# Patient Record
Sex: Female | Born: 1970 | Race: Black or African American | Hispanic: No | Smoking: Never smoker
Health system: Southern US, Community
[De-identification: ages and names within clinical notes are randomized; demographics above are authoritative.]

## PROBLEM LIST (undated history)

## (undated) DIAGNOSIS — J45909 Unspecified asthma, uncomplicated: Secondary | ICD-10-CM

## (undated) DIAGNOSIS — I252 Old myocardial infarction: Secondary | ICD-10-CM

## (undated) DIAGNOSIS — I10 Essential (primary) hypertension: Secondary | ICD-10-CM

## (undated) DIAGNOSIS — I509 Heart failure, unspecified: Secondary | ICD-10-CM

## (undated) DIAGNOSIS — F32A Depression, unspecified: Secondary | ICD-10-CM

## (undated) DIAGNOSIS — E119 Type 2 diabetes mellitus without complications: Secondary | ICD-10-CM

## (undated) DIAGNOSIS — F329 Major depressive disorder, single episode, unspecified: Secondary | ICD-10-CM

## (undated) HISTORY — PX: ABDOMINAL HYSTERECTOMY: SHX81

## (undated) HISTORY — PX: TONSILLECTOMY: SUR1361

## (undated) HISTORY — PX: BACK SURGERY: SHX140

## (undated) HISTORY — PX: GANGLION CYST EXCISION: SHX1691

## (undated) HISTORY — PX: HERNIA REPAIR: SHX51

---

## 2018-04-16 ENCOUNTER — Emergency Department (HOSPITAL_COMMUNITY)
Admission: EM | Admit: 2018-04-16 | Discharge: 2018-04-16 | Disposition: A | Discharge status: Home / Self Care | Payer: Self-pay | Attending: Emergency Medicine | Admitting: Emergency Medicine

## 2018-04-16 ENCOUNTER — Emergency Department (HOSPITAL_COMMUNITY): Payer: Self-pay

## 2018-04-16 ENCOUNTER — Encounter (HOSPITAL_COMMUNITY): Payer: Self-pay | Admitting: Emergency Medicine

## 2018-04-16 DIAGNOSIS — J45909 Unspecified asthma, uncomplicated: Secondary | ICD-10-CM | POA: Insufficient documentation

## 2018-04-16 DIAGNOSIS — I10 Essential (primary) hypertension: Secondary | ICD-10-CM | POA: Insufficient documentation

## 2018-04-16 DIAGNOSIS — I509 Heart failure, unspecified: Secondary | ICD-10-CM | POA: Insufficient documentation

## 2018-04-16 DIAGNOSIS — M25562 Pain in left knee: Secondary | ICD-10-CM | POA: Insufficient documentation

## 2018-04-16 DIAGNOSIS — I110 Hypertensive heart disease with heart failure: Secondary | ICD-10-CM | POA: Insufficient documentation

## 2018-04-16 DIAGNOSIS — M25561 Pain in right knee: Secondary | ICD-10-CM | POA: Insufficient documentation

## 2018-04-16 HISTORY — DX: Depression, unspecified: F32.A

## 2018-04-16 HISTORY — DX: Unspecified asthma, uncomplicated: J45.909

## 2018-04-16 HISTORY — DX: Essential (primary) hypertension: I10

## 2018-04-16 HISTORY — DX: Major depressive disorder, single episode, unspecified: F32.9

## 2018-04-16 HISTORY — DX: Heart failure, unspecified: I50.9

## 2018-04-16 MED ORDER — HYDROCODONE-ACETAMINOPHEN 5-325 MG PO TABS: 1.0000 | ORAL_TABLET | ORAL | 0 refills | Status: DC | PRN

## 2018-04-16 MED ORDER — CEPHALEXIN 500 MG PO CAPS: 500.0000 mg | ORAL_CAPSULE | Freq: Once | ORAL | Status: DC

## 2018-04-16 MED ORDER — SULFAMETHOXAZOLE-TRIMETHOPRIM 800-160 MG PO TABS: 1.0000 | ORAL_TABLET | Freq: Once | ORAL | Status: DC

## 2018-04-16 MED ORDER — HYDROCODONE-ACETAMINOPHEN 5-325 MG PO TABS
2.0000 | ORAL_TABLET | Freq: Once | ORAL | Status: AC
  Administered 2018-04-16: 2 via ORAL
  Filled 2018-04-16: qty 2

## 2018-04-16 NOTE — Discharge Instructions (Signed)
It was my pleasure taking care of you today!   Please see the information and instructions below regarding your visit.  Your diagnoses today include:  1. Acute pain of both knees     Tests performed today include: See side panel of your discharge paperwork for testing performed today. Vital signs are listed at the bottom of these instructions.   Your xrays show extensive arthritis.   Medications prescribed:    Take any prescribed medications only as prescribed, and any over the counter medications only as directed on the packaging.  Norco as needed for pain. Use crutches as needed for comfort. Ice and elevate knee throughout the day.  You have been prescribed Norco for pain. This is an opioid pain medication. You may take this medication every 4-6 hours as needed for pain. Only take this medication if you need it for breakthrough pain. You may combine this medicine with your Meloxicam, a non-steroidal anti-inflammatory drug (NSAID).  Do not combine this medication with Tylenol, as it may increase the risk of liver problems.  Do not combine this medication with alcohol.  Please be advised to avoid driving or operating heavy machinery while taking this medication, as it may make you drowsy or impair judgment.    Home care instructions:  Please follow any educational materials contained in this packet.   Follow-up instructions: Please follow-up with your primary care provider as soon as possible for further evaluation of your symptoms if they are not completely improved.   Call the orthopedist listed today or tomorrow to schedule follow up appointment for recheck of ongoing knee pain in one to two weeks. That appointment can be canceled with a 24-48 hour notice if complete resolution of pain.   Follow up with the orthopedist listed if symptoms do not improve in one week.   Return instructions:  Please return to the Emergency Department if you experience worsening symptoms.    Please return for any increasing swelling, increasing pain, loss of color to the lower leg, or increasing redness. Please return if you have any other emergent concerns.  Additional Information:   Your vital signs today were: BP (!) 176/102 (BP Location: Left Arm)    Pulse 85    Temp 99.3 F (37.4 C) (Oral)    Resp 19    SpO2 99%  If your blood pressure (BP) was elevated on multiple readings during this visit above 130 for the top number or above 80 for the bottom number, please have this repeated by your primary care provider within one month. --------------

## 2018-04-16 NOTE — ED Triage Notes (Signed)
Blat knee pain for weeks. Denies any falls or injuries to cause pain.

## 2018-04-16 NOTE — Progress Notes (Signed)
Orthopedic Tech Progress Note Patient Details:  Courtney Clayton 30-Jul-1970 [id number] Pt is not going to be able to use crutches. Pt was told to get a walker if she can in order to take some weight off her knee. Patient ID: Courtney Clayton, female   DOB: 30-Jul-1970, 47 y.o.   MRN: [id number]   Tawni CarnesHarrison, Laloni Rowton [facility] Hills[facility] 04/16/2018, 5:28 PM

## 2018-04-16 NOTE — ED Provider Notes (Signed)
Medical screening examination/treatment/procedure(s) were conducted as a shared visit with non-physician practitioner(s) and myself.  I personally evaluated the patient during the encounter.  None Patient has had longstanding chronic knee pain.  She has had pain for years since falling on her knees.  She says several years ago she was seeing orthopedics and she was told that she had degenerative changes and problems with the cartilages in her knees.  The severity of pain waxes and wanes.  Patient does not have any erythema.  She has general tenderness to palpation of the anterior knee the right.  There is no erythema or warmth.  Lower leg is pliable.  Left knee visible swelling or erythema.  Slightly tender to palpation but not significantly so.  This time, appears to be acute on chronic knee pain.  Currently not suggestive of infectious etiology.  Agree with treatment and plan.   Arby BarrettePfeiffer, Treasure Ochs, MD 04/16/18 [phone]

## 2018-04-18 NOTE — ED Provider Notes (Signed)
[facility]-EMERGENCY DEPT Provider Note   CSN: [id number] Arrival date & time: 04/16/18  1327     History   Chief Complaint Chief Complaint  Patient presents with  . Knee Pain    HPI Courtney Clayton is a 47 y.o. female.  HPI  Patient is a 47 year old female with a history of hypertension, asthma presenting for bilateral knee pain.  Patient reports that she has had progressively worsening bilateral knee pain for several months, have for the past 3 days, the pain in her right knee has become so unbearable she is barely been able to walk on it.  Patient denies any erythema or swelling bilateral knees.  Denies loss of sensation or weakness distally.  Patient reports she has a prior history of knee trauma.  She is never been evaluated by an orthopedist.  Patient has any fever or chills.  No history of immunocompromise status, IVDU, or autoimmune disease.  Patient reports she has been taking home meloxicam which is a home baseline medication for without relief.  Past Medical History:  Diagnosis Date  . Asthma   . CHF (congestive heart failure) (HCC)   . Depression   . Hypertension     There are no active problems to display for this patient.   Past Surgical History:  Procedure Laterality Date  . ABDOMINAL HYSTERECTOMY    . BACK SURGERY    . GANGLION CYST EXCISION    . HERNIA REPAIR    . TONSILLECTOMY       OB History   None      Home Medications    Prior to Admission medications   Medication Sig Start Date End Date Taking? Authorizing Provider  HYDROcodone-acetaminophen (NORCO/VICODIN) 5-325 MG tablet Take 1-2 tablets by mouth every 4 (four) hours as needed. 04/16/18   Elisha Ponder, PA-C    Family History No family history on file.  Social History Social History   Tobacco Use  . Smoking status: Never Smoker  . Smokeless tobacco: Never Used  Substance Use Topics  . Alcohol use: Not on file  . Drug use: Not on file     Allergies    Patient has no known allergies.   Review of Systems Review of Systems  Constitutional: Negative for chills and fever.  Musculoskeletal: Positive for arthralgias and myalgias.  Skin: Negative for color change and wound.  Allergic/Immunologic: Negative for immunocompromised state.  Neurological: Negative for weakness and numbness.     Physical Exam Updated Vital Signs BP (!) 176/102 (BP Location: Left Arm)   Pulse 83   Temp 99.3 F (37.4 C) (Oral)   Resp 19   SpO2 98%   Physical Exam  Constitutional: She appears well-developed and well-nourished. No distress.  Sitting comfortably in bed.  HENT:  Head: Normocephalic and atraumatic.  Eyes: Conjunctivae are normal. Right eye exhibits no discharge. Left eye exhibits no discharge.  EOMs normal to gross examination.  Neck: Normal range of motion.  Cardiovascular: Normal rate and regular rhythm.  Intact, 2+ DP pulse bilaterally.  Pulmonary/Chest:  Normal respiratory effort. Patient converses comfortably. No audible wheeze or stridor.  Abdominal: She exhibits no distension.  Musculoskeletal:  Right knee with tenderness to palpation of anterior knee. Decreased ROM 2/2 pain. No joint line tenderness. No joint effusion or swelling appreciated. No abnormal alignment or patellar mobility. No bruising, erythema or warmth overlaying the joint. No varus/valgus laxity. Negative drawer's, Lachman's and McMurray's.  No crepitus.  2+ DP pulses bilaterally. All  compartments are soft. Sensation intact distal to injury.  Left knee with no tenderness to palpation of the joint. Full ROM. No joint line tenderness. No joint effusion or swelling appreciated. No abnormal alignment or patellar mobility. No bruising, erythema or warmth overlaying the joint. No varus/valgus laxity. Negative drawer's, Lachman's and McMurray's.  No crepitus.  2+ DP pulses bilaterally. All compartments are soft. Sensation intact distal to injury.   Neurological: She is alert.   Cranial nerves intact to gross observation. Patient moves extremities without difficulty.  Skin: Skin is warm and dry. She is not diaphoretic.  Psychiatric: She has a normal mood and affect. Her behavior is normal. Judgment and thought content normal.  Nursing note and vitals reviewed.    ED Treatments / Results  Labs (all labs ordered are listed, but only abnormal results are displayed) Labs Reviewed - No data to display  EKG None  Radiology Dg Knee Complete 4 Views Left  Result Date: 04/16/2018 CLINICAL DATA:  Bilateral knee pain for weeks.  No trauma. EXAM: LEFT KNEE - COMPLETE 4+ VIEW COMPARISON:  None. FINDINGS: No acute fracture or dislocation. Significantly age advanced, mild to moderate medial and patellofemoral compartment joint space narrowing with osteophyte formation. Enthesophyte at the quadriceps insertion and patellar tendon origin. No joint effusion. 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  No acute findings. Electronically Signed   By: Jeronimo Greaves M.D.   On: 04/16/2018 15:23   Dg Knee Complete 4 Views Right  Result Date: 04/16/2018 CLINICAL DATA:  Knee pain for weeks without recent trauma. EXAM: RIGHT KNEE - COMPLETE 4+ VIEW COMPARISON:  None. FINDINGS: Mild but age advanced medial and patellofemoral compartment joint space narrowing with osteophyte formation. Enthesophyte at the quadriceps insertion. No acute fracture or dislocation. No joint effusion. IMPRESSION: Degenerative change, without acute osseous finding. Electronically Signed   By: Jeronimo Greaves M.D.   On: 04/16/2018 15:27    Procedures Procedures (including critical care time)  Medications Ordered in ED Medications  HYDROcodone-acetaminophen (NORCO/VICODIN) 5-325 MG per tablet 2 tablet (2 tablets Oral Given 04/16/18 1653)     Initial Impression / Assessment and Plan / ED Course  I have reviewed the triage vital signs and the nursing notes.  Pertinent labs & imaging results that were available  during my care of the patient were reviewed by me and considered in my medical decision making (see chart for details).     Patient is nontoxic-appearing, afebrile, and in no acute distress.  Patient with progressively worsening knee pain.  There is no effusion appreciated on exam nor x-ray of the right knee which is most symptomatic patient.  Do not suspect septic arthritis nor hemarthrosis.  Radiographs of bilateral knees demonstrating age advanced osteoarthritis.  Suspect that patient has worsening osteoarthritis versus degenerative meniscal tear.  Given knee sleeves, and crutches for supportive care with the right knee just to use as tolerated.  Instructed patient that ultimate management will be weightbearing as tolerated, orthopedics follow-up.  Patient had multiple elevated blood pressures emergency department.  Patient with a history of hypertension, and reports taking her antihypertensives today.  Patient prescribed short course of Norco to avoid NSAIDs at this time.  Patient started not to drive, drink alcohol, or operate machinery while taking this medication.   I have reviewed the patient's information in the [place] Controlled Substance Database for the past 12 months and found them to have no Rx overlapping.  Opiates were prescribed for an acute, painful condition. The patient was given information  on side effects and encouraged to use other, non-opiate pain medication primary, only using opiate medicine sparingly for severe pain.  This is a shared visit with Dr. Arby BarretteMarcy Pfeiffer. Patient was independently evaluated by this attending physician. Attending physician consulted in evaluation and discharge management.  Final Clinical Impressions(s) / ED Diagnoses   Final diagnoses:  Acute pain of both knees  Elevated blood pressure reading in office with diagnosis of hypertension    ED Discharge Orders         Ordered    HYDROcodone-acetaminophen (NORCO/VICODIN) 5-325 MG tablet   Every 4 hours PRN     04/16/18 1712           Elisha PonderMurray, Quante Pettry B, PA-C 04/18/18 1819    Arby BarrettePfeiffer, Marcy, MD 04/20/18 [phone]

## 2018-05-02 ENCOUNTER — Other Ambulatory Visit: Payer: Self-pay | Admitting: Orthopedic Surgery

## 2018-05-02 DIAGNOSIS — R52 Pain, unspecified: Secondary | ICD-10-CM

## 2018-05-02 DIAGNOSIS — R531 Weakness: Secondary | ICD-10-CM

## 2018-05-16 ENCOUNTER — Ambulatory Visit
Admission: RE | Admit: 2018-05-16 | Discharge: 2018-05-16 | Disposition: A | Discharge status: Home / Self Care | Payer: 59 | Source: Ambulatory Visit | Attending: Orthopedic Surgery | Admitting: Orthopedic Surgery

## 2018-05-16 DIAGNOSIS — R52 Pain, unspecified: Secondary | ICD-10-CM

## 2018-05-16 DIAGNOSIS — R531 Weakness: Secondary | ICD-10-CM

## 2019-03-27 ENCOUNTER — Other Ambulatory Visit: Payer: Self-pay

## 2019-03-27 DIAGNOSIS — Z20822 Contact with and (suspected) exposure to covid-19: Secondary | ICD-10-CM

## 2019-03-29 LAB — NOVEL CORONAVIRUS, NAA: SARS-CoV-2, NAA: NOT DETECTED

## 2019-04-01 ENCOUNTER — Other Ambulatory Visit: Payer: Self-pay

## 2019-04-01 ENCOUNTER — Emergency Department (HOSPITAL_COMMUNITY)
Admission: EM | Admit: 2019-04-01 | Discharge: 2019-04-01 | Disposition: A | Discharge status: Home / Self Care | Payer: 59 | Attending: Emergency Medicine | Admitting: Emergency Medicine

## 2019-04-01 ENCOUNTER — Encounter (HOSPITAL_COMMUNITY): Payer: Self-pay | Admitting: Emergency Medicine

## 2019-04-01 DIAGNOSIS — J45909 Unspecified asthma, uncomplicated: Secondary | ICD-10-CM | POA: Insufficient documentation

## 2019-04-01 DIAGNOSIS — T7840XA Allergy, unspecified, initial encounter: Secondary | ICD-10-CM

## 2019-04-01 DIAGNOSIS — I110 Hypertensive heart disease with heart failure: Secondary | ICD-10-CM | POA: Insufficient documentation

## 2019-04-01 DIAGNOSIS — E119 Type 2 diabetes mellitus without complications: Secondary | ICD-10-CM | POA: Diagnosis not present

## 2019-04-01 DIAGNOSIS — I509 Heart failure, unspecified: Secondary | ICD-10-CM | POA: Diagnosis not present

## 2019-04-01 DIAGNOSIS — L509 Urticaria, unspecified: Secondary | ICD-10-CM

## 2019-04-01 DIAGNOSIS — L500 Allergic urticaria: Secondary | ICD-10-CM | POA: Diagnosis not present

## 2019-04-01 HISTORY — DX: Type 2 diabetes mellitus without complications: E11.9

## 2019-04-01 MED ORDER — METHYLPREDNISOLONE SODIUM SUCC 125 MG IJ SOLR
125.0000 mg | Freq: Once | INTRAMUSCULAR | Status: AC
  Administered 2019-04-01: 02:00:00 125 mg via INTRAVENOUS
  Filled 2019-04-01: qty 2

## 2019-04-01 MED ORDER — FAMOTIDINE 20 MG PO TABS: 20.0000 mg | ORAL_TABLET | Freq: Two times a day (BID) | ORAL | 0 refills | Status: DC

## 2019-04-01 MED ORDER — FAMOTIDINE IN NACL 20-0.9 MG/50ML-% IV SOLN
20.0000 mg | Freq: Once | INTRAVENOUS | Status: AC
  Administered 2019-04-01: 20 mg via INTRAVENOUS
  Filled 2019-04-01: qty 50

## 2019-04-01 MED ORDER — DIPHENHYDRAMINE HCL 50 MG/ML IJ SOLN
25.0000 mg | Freq: Once | INTRAMUSCULAR | Status: AC
  Administered 2019-04-01: 25 mg via INTRAVENOUS
  Filled 2019-04-01: qty 1

## 2019-04-01 MED ORDER — PREDNISONE 20 MG PO TABS: 40.0000 mg | ORAL_TABLET | Freq: Every day | ORAL | 0 refills | Status: DC

## 2019-04-01 MED ORDER — CETIRIZINE HCL 10 MG PO TABS: 10.0000 mg | ORAL_TABLET | Freq: Two times a day (BID) | ORAL | 0 refills | Status: DC

## 2019-04-01 MED ORDER — HYDROCORTISONE 2.5 % EX LOTN: TOPICAL_LOTION | Freq: Two times a day (BID) | CUTANEOUS | 0 refills | Status: DC

## 2019-04-01 MED ORDER — HYDROCORTISONE 1 % EX LOTN
TOPICAL_LOTION | Freq: Three times a day (TID) | CUTANEOUS | Status: DC
  Administered 2019-04-01: 04:00:00 via TOPICAL
  Filled 2019-04-01: qty 118

## 2019-04-01 NOTE — Discharge Instructions (Signed)
1. Medications: Prednisone, Zyrtec, Pepcid, hydrocortisone lotion, usual home medications 2. Treatment: rest, drink plenty of fluids, take medications as prescribed 3. Follow Up: Please followup with your primary doctor in 3 days for discussion of your diagnoses and further evaluation after today's visit; please also follow-up with asthma and allergy center.; Return to the ER for difficulty breathing, return of allergic reaction or other concerning symptoms

## 2019-04-01 NOTE — ED Triage Notes (Signed)
Pt arriving with complaint of allergic reaction. Pt reports severe itching over the last 2 days. Pt reports using several different methods at home to soothe symptoms with no relief. Unknown source.

## 2019-04-01 NOTE — ED Provider Notes (Signed)
[facility] DEPT Provider Note   CSN: [id number] Arrival date & time: 04/01/19  0028     History   Chief Complaint Chief Complaint  Patient presents with  . Allergic Reaction    HPI Courtney Clayton is a 48 y.o. female with a hx of asthma, CHF, depression, diabetes, hypertension presents to the Emergency Department complaining of gradual, persistent, progressively worsening itchy rash onset approximately 2 days ago.  Patient reports similar to previous allergic reactions.  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  She denies any exposure to either of these.  Several days before her rash started she did begin taking a vitamin C and zinc supplements.  She has not stopped these yet.  She reports using chamomile lotion, oatmeal baths and Benadryl without relief of her itching.  Denies any additional exposures.  Denies known bug bites.  No one else in the home has a rash.  Patient denies difficulty breathing, wheezing, fever, chills, nausea, vomiting.  Patient has never had an anaphylactic reaction.       The history is provided by the patient and medical records. No language interpreter was used.    Past Medical History:  Diagnosis Date  . Asthma   . CHF (congestive heart failure) ([place])   . Depression   . Diabetes mellitus without complication ([place])   . Hypertension     There are no active problems to display for this patient.   Past Surgical History:  Procedure Laterality Date  . ABDOMINAL HYSTERECTOMY    . BACK SURGERY    . GANGLION CYST EXCISION    . HERNIA REPAIR    . TONSILLECTOMY       OB History   No obstetric history on file.      Home Medications    Prior to Admission medications   Medication Sig Start Date End Date Taking? Authorizing Provider  cetirizine (ZYRTEC ALLERGY) 10 MG tablet Take 1 tablet (10 mg total) by mouth 2 (two) times daily for 5 days. 04/01/19 04/06/19  Bengie Kaucher,  Jarrett Soho, PA-C  famotidine (PEPCID) 20 MG tablet Take 1 tablet (20 mg total) by mouth 2 (two) times daily. 04/01/19   Angelique Chevalier, Jarrett Soho, PA-C  HYDROcodone-acetaminophen (NORCO/VICODIN) 5-325 MG tablet Take 1-2 tablets by mouth every 4 (four) hours as needed. 04/16/18   Langston Masker B, PA-C  hydrocortisone 2.5 % lotion Apply topically 2 (two) times daily. 04/01/19   Nettye Flegal, Jarrett Soho, PA-C  predniSONE (DELTASONE) 20 MG tablet Take 2 tablets (40 mg total) by mouth daily. 04/01/19   Winn Muehl, Jarrett Soho, PA-C    Family History No family history on file.  Social History Social History   Tobacco Use  . Smoking status: Never Smoker  . Smokeless tobacco: Never Used  Substance Use Topics  . Alcohol use: Not on file  . Drug use: Not on file     Allergies   Patient has no known allergies.   Review of Systems Review of Systems  Constitutional: Negative for appetite change, diaphoresis, fatigue, fever and unexpected weight change.  HENT: Negative for mouth sores.   Eyes: Negative for visual disturbance.  Respiratory: Negative for cough, chest tightness, shortness of breath and wheezing.   Cardiovascular: Negative for chest pain.  Gastrointestinal: Negative for abdominal pain, constipation, diarrhea, nausea and vomiting.  Endocrine: Negative for polydipsia, polyphagia and polyuria.  Genitourinary: Negative for dysuria, frequency, hematuria and urgency.  Musculoskeletal: Negative for back pain and neck stiffness.  Skin:  Positive for rash.  Allergic/Immunologic: Negative for immunocompromised state.  Neurological: Negative for syncope, light-headedness and headaches.  Hematological: Does not bruise/bleed easily.  Psychiatric/Behavioral: Negative for sleep disturbance. The patient is not nervous/anxious.      Physical Exam Updated Vital Signs BP (!) 171/108 (BP Location: Left Arm)   Pulse 85   Temp 98 F (36.7 C) (Oral)   Resp 20   Ht 5\' 5"  (1.651 m)   Wt 129.3 kg   SpO2  96%   BMI 47.43 kg/m   Physical Exam Vitals signs and nursing note reviewed.  Constitutional:      General: She is not in acute distress.    Appearance: She is well-developed. She is not diaphoretic.  HENT:     Head: Normocephalic and atraumatic.     Nose: Nose normal. No rhinorrhea.     Mouth/Throat:     Pharynx: Oropharynx is clear. Uvula midline. No pharyngeal swelling or uvula swelling.  Eyes:     Conjunctiva/sclera: Conjunctivae normal.  Neck:     Musculoskeletal: Normal range of motion.     Comments: Patent airway No stridor; normal phonation Handling secretions without difficulty Cardiovascular:     Rate and Rhythm: Normal rate.  Pulmonary:     Effort: Pulmonary effort is normal. No respiratory distress.  Abdominal:     General: There is no distension.  Musculoskeletal: Normal range of motion.  Skin:    General: Skin is warm and dry.     Findings: Rash present.     Comments: Urticaria noted to the chest, arms, back Mild excoriations - no induration or fluctuance to indicate secondary infection  Neurological:     Mental Status: She is alert and oriented to person, place, and time.      ED Treatments / Results   Procedures Procedures (including critical care time)  Medications Ordered in ED Medications  hydrocortisone 1 % lotion (has no administration in time range)  methylPREDNISolone sodium succinate (SOLU-MEDROL) 125 mg/2 mL injection 125 mg (125 mg Intravenous Given 04/01/19 0133)  famotidine (PEPCID) IVPB 20 mg premix (0 mg Intravenous Stopped 04/01/19 0247)  diphenhydrAMINE (BENADRYL) injection 25 mg (25 mg Intravenous Given 04/01/19 0133)     Initial Impression / Assessment and Plan / ED Course  I have reviewed the triage vital signs and the nursing notes.  Pertinent labs & imaging results that were available during my care of the patient were reviewed by me and considered in my medical decision making (see chart for details).         With  allergic reaction.  No evidence of anaphylaxis.  Patient given Benadryl, Pepcid and Solu-Medrol here in the emergency department.  No respiratory distress, oral swelling, vomiting or wheezing.  3:30 AM Patient re-evaluated prior to dc, is hemodynamically stable, in no respiratory distress, and denies the feeling of throat closing. Pt discharged with symptomatic therapy.  She is to stop taking the new medications.  She will be referred to the allergy and asthma center.  Discussed reasons to return to the emergency department.     Final Clinical Impressions(s) / ED Diagnoses   Final diagnoses:  Allergic reaction, initial encounter  Urticaria    ED Discharge Orders         Ordered    cetirizine (ZYRTEC ALLERGY) 10 MG tablet  2 times daily     04/01/19 0334    famotidine (PEPCID) 20 MG tablet  2 times daily     04/01/19 0334  predniSONE (DELTASONE) 20 MG tablet  Daily     04/01/19 0334    hydrocortisone 2.5 % lotion  2 times daily     04/01/19 0334           Yoshiharu Brassell, Boyd KerbsHannah, PA-C 04/01/19 0335    Geoffery Lyonselo, Douglas, MD 04/01/19 [phone]

## 2019-04-15 ENCOUNTER — Emergency Department (HOSPITAL_COMMUNITY): Payer: 59

## 2019-04-15 ENCOUNTER — Emergency Department (HOSPITAL_COMMUNITY)
Admission: EM | Admit: 2019-04-15 | Discharge: 2019-04-15 | Disposition: A | Discharge status: Home / Self Care | Payer: 59 | Attending: Emergency Medicine | Admitting: Emergency Medicine

## 2019-04-15 ENCOUNTER — Other Ambulatory Visit: Payer: Self-pay

## 2019-04-15 DIAGNOSIS — Z79899 Other long term (current) drug therapy: Secondary | ICD-10-CM | POA: Insufficient documentation

## 2019-04-15 DIAGNOSIS — M62838 Other muscle spasm: Secondary | ICD-10-CM | POA: Diagnosis not present

## 2019-04-15 DIAGNOSIS — Z7984 Long term (current) use of oral hypoglycemic drugs: Secondary | ICD-10-CM | POA: Diagnosis not present

## 2019-04-15 DIAGNOSIS — M791 Myalgia, unspecified site: Secondary | ICD-10-CM | POA: Diagnosis present

## 2019-04-15 DIAGNOSIS — I110 Hypertensive heart disease with heart failure: Secondary | ICD-10-CM | POA: Insufficient documentation

## 2019-04-15 DIAGNOSIS — Z20828 Contact with and (suspected) exposure to other viral communicable diseases: Secondary | ICD-10-CM | POA: Diagnosis not present

## 2019-04-15 DIAGNOSIS — R52 Pain, unspecified: Secondary | ICD-10-CM

## 2019-04-15 DIAGNOSIS — I509 Heart failure, unspecified: Secondary | ICD-10-CM | POA: Diagnosis not present

## 2019-04-15 DIAGNOSIS — J45909 Unspecified asthma, uncomplicated: Secondary | ICD-10-CM | POA: Insufficient documentation

## 2019-04-15 DIAGNOSIS — E119 Type 2 diabetes mellitus without complications: Secondary | ICD-10-CM | POA: Diagnosis not present

## 2019-04-15 LAB — COMPREHENSIVE METABOLIC PANEL
ALT: 15 U/L (ref 0–44)
AST: 18 U/L (ref 15–41)
Albumin: 3.7 g/dL (ref 3.5–5.0)
Alkaline Phosphatase: 67 U/L (ref 38–126)
Anion gap: 10 (ref 5–15)
BUN: 11 mg/dL (ref 6–20)
CO2: 26 mmol/L (ref 22–32)
Calcium: 8.8 mg/dL — ABNORMAL LOW (ref 8.9–10.3)
Chloride: 103 mmol/L (ref 98–111)
Creatinine, Ser: 0.72 mg/dL (ref 0.44–1.00)
GFR calc Af Amer: >60 mL/min (ref >60)
GFR calc non Af Amer: >60 mL/min (ref >60)
Glucose, Bld: 118 mg/dL — ABNORMAL HIGH (ref 70–99)
Potassium: 3.4 mmol/L — ABNORMAL LOW (ref 3.5–5.1)
Sodium: 139 mmol/L (ref 135–145)
Total Bilirubin: 0.8 mg/dL (ref 0.3–1.2)
Total Protein: 7.1 g/dL (ref 6.5–8.1)

## 2019-04-15 LAB — CBC WITH DIFFERENTIAL/PLATELET
Abs Immature Granulocytes: 0.05 10*3/uL (ref 0.00–0.07)
Basophils Absolute: 0 10*3/uL (ref 0.0–0.1)
Basophils Relative: 0 %
Eosinophils Absolute: 0.1 10*3/uL (ref 0.0–0.5)
Eosinophils Relative: 1 %
HCT: 40.5 % (ref 36.0–46.0)
Hemoglobin: 12.5 g/dL (ref 12.0–15.0)
Immature Granulocytes: 1 %
Lymphocytes Relative: 42 %
Lymphs Abs: 4 10*3/uL (ref 0.7–4.0)
MCH: 26.3 pg (ref 26.0–34.0)
MCHC: 30.9 g/dL (ref 30.0–36.0)
MCV: 85.1 fL (ref 80.0–100.0)
Monocytes Absolute: 0.8 10*3/uL (ref 0.1–1.0)
Monocytes Relative: 8 %
Neutro Abs: 4.5 10*3/uL (ref 1.7–7.7)
Neutrophils Relative %: 48 %
Platelets: 270 10*3/uL (ref 150–400)
RBC: 4.76 MIL/uL (ref 3.87–5.11)
RDW: 14.9 % (ref 11.5–15.5)
WBC: 9.4 10*3/uL (ref 4.0–10.5)
nRBC: 0 % (ref 0.0–0.2)

## 2019-04-15 LAB — POC SARS CORONAVIRUS 2 AG -  ED: SARS Coronavirus 2 Ag: NEGATIVE

## 2019-04-15 LAB — CK: Total CK: 276 U/L — ABNORMAL HIGH (ref 38–234)

## 2019-04-15 LAB — TROPONIN I (HIGH SENSITIVITY)
Troponin I (High Sensitivity): 6 ng/L (ref <18)
Troponin I (High Sensitivity): 5 ng/L (ref <18)

## 2019-04-15 MED ORDER — DIAZEPAM 5 MG PO TABS
5.0000 mg | ORAL_TABLET | Freq: Once | ORAL | Status: AC
  Administered 2019-04-15: 5 mg via ORAL
  Filled 2019-04-15: qty 1

## 2019-04-15 MED ORDER — LORAZEPAM 2 MG/ML IJ SOLN
1.0000 mg | Freq: Once | INTRAMUSCULAR | Status: AC
  Administered 2019-04-15: 1 mg via INTRAVENOUS
  Filled 2019-04-15: qty 1

## 2019-04-15 MED ORDER — KETOROLAC TROMETHAMINE 15 MG/ML IJ SOLN
15.0000 mg | Freq: Once | INTRAMUSCULAR | Status: AC | PRN
  Administered 2019-04-15: 15 mg via INTRAVENOUS
  Filled 2019-04-15: qty 1

## 2019-04-15 MED ORDER — CYCLOBENZAPRINE HCL 10 MG PO TABS: 10.0000 mg | ORAL_TABLET | Freq: Three times a day (TID) | ORAL | 0 refills | Status: DC | PRN

## 2019-04-15 MED ORDER — KETOROLAC TROMETHAMINE 15 MG/ML IJ SOLN
15.0000 mg | Freq: Once | INTRAMUSCULAR | Status: AC
  Administered 2019-04-15: 15 mg via INTRAVENOUS
  Filled 2019-04-15: qty 1

## 2019-04-15 NOTE — ED Triage Notes (Signed)
Per EMS, Pt is coming from home. Pt began feeling muscle spasms and pain after moving furniture yesterday. Fever with EMS. Pt has a hx of HTN.

## 2019-04-15 NOTE — Discharge Instructions (Signed)
Person Under Monitoring Name: Courtney Clayton  Location: [place] [address]   Infection Prevention Recommendations for Individuals Confirmed to have, or Being Evaluated for, 2019 Novel Coronavirus (COVID-19) Infection Who Receive Care at Home  Individuals who are confirmed to have, or are being evaluated for, COVID-19 should follow the prevention steps below until a healthcare provider or local or state health department says they can return to normal activities.  Stay home except to get medical care You should restrict activities outside your home, except for getting medical care. Do not go to work, school, or public areas, and do not use public transportation or taxis.  Call ahead before visiting your doctor Before your medical appointment, call the healthcare provider and tell them that you have, or are being evaluated for, COVID-19 infection. This will help the healthcare providers office take steps to keep other people from getting infected. Ask your healthcare provider to call the local or state health department.  Monitor your symptoms Seek prompt medical attention if your illness is worsening (e.g., difficulty breathing). Before going to your medical appointment, call the healthcare provider and tell them that you have, or are being evaluated for, COVID-19 infection. Ask your healthcare provider to call the local or state health department.  Wear a facemask You should wear a facemask that covers your nose and mouth when you are in the same room with other people and when you visit a healthcare provider. People who live with or visit you should also wear a facemask while they are in the same room with you.  Separate yourself from other people in your home As much as possible, you should stay in a different room from other people in your home. Also, you should use a separate bathroom, if available.  Avoid sharing household items You should  not share dishes, drinking glasses, cups, eating utensils, towels, bedding, or other items with other people in your home. After using these items, you should wash them thoroughly with soap and water.  Cover your coughs and sneezes Cover your mouth and nose with a tissue when you cough or sneeze, or you can cough or sneeze into your sleeve. Throw used tissues in a lined trash can, and immediately wash your hands with soap and water for at least 20 seconds or use an alcohol-based hand rub.  Wash your [facility] your hands often and thoroughly with soap and water for at least 20 seconds. You can use an alcohol-based hand sanitizer if soap and water are not available and if your hands are not visibly dirty. Avoid touching your eyes, nose, and mouth with unwashed hands.   Prevention Steps for Caregivers and Household Members of Individuals Confirmed to have, or Being Evaluated for, COVID-19 Infection Being Cared for in the Home  If you live with, or provide care at home for, a person confirmed to have, or being evaluated for, COVID-19 infection please follow these guidelines to prevent infection:  Follow healthcare providers instructions Make sure that you understand and can help the patient follow any healthcare provider instructions for all care.  Provide for the patients basic needs You should help the patient with basic needs in the home and provide support for getting groceries, prescriptions, and other personal needs.  Monitor the patients symptoms If they are getting sicker, call his or her medical provider and tell them that the patient has, or is being evaluated for, COVID-19 infection. This will help the healthcare  providers office take steps to keep other people from getting infected. Ask the healthcare provider to call the local or state health department.  Limit the number of people who have contact with the patient If possible, have only one caregiver for the  patient. Other household members should stay in another home or place of residence. If this is not possible, they should stay in another room, or be separated from the patient as much as possible. Use a separate bathroom, if available. Restrict visitors who do not have an essential need to be in the home.  Keep older adults, very young children, and other sick people away from the patient Keep older adults, very young children, and those who have compromised immune systems or chronic health conditions away from the patient. This includes people with chronic heart, lung, or kidney conditions, diabetes, and cancer.  Ensure good ventilation Make sure that shared spaces in the home have good air flow, such as from an air conditioner or an opened window, weather permitting.  Wash your hands often Wash your hands often and thoroughly with soap and water for at least 20 seconds. You can use an alcohol based hand sanitizer if soap and water are not available and if your hands are not visibly dirty. Avoid touching your eyes, nose, and mouth with unwashed hands. Use disposable paper towels to dry your hands. If not available, use dedicated cloth towels and replace them when they become wet.  Wear a facemask and gloves Wear a disposable facemask at all times in the room and gloves when you touch or have contact with the patients blood, body fluids, and/or secretions or excretions, such as sweat, saliva, sputum, nasal mucus, vomit, urine, or feces.  Ensure the mask fits over your nose and mouth tightly, and do not touch it during use. Throw out disposable facemasks and gloves after using them. Do not reuse. Wash your hands immediately after removing your facemask and gloves. If your personal clothing becomes contaminated, carefully remove clothing and launder. Wash your hands after handling contaminated clothing. Place all used disposable facemasks, gloves, and other waste in a lined container before  disposing them with other household waste. Remove gloves and wash your hands immediately after handling these items.  Do not share dishes, glasses, or other household items with the patient Avoid sharing household items. You should not share dishes, drinking glasses, cups, eating utensils, towels, bedding, or other items with a patient who is confirmed to have, or being evaluated for, COVID-19 infection. After the person uses these items, you should wash them thoroughly with soap and water.  Wash laundry thoroughly Immediately remove and wash clothes or bedding that have blood, body fluids, and/or secretions or excretions, such as sweat, saliva, sputum, nasal mucus, vomit, urine, or feces, on them. Wear gloves when handling laundry from the patient. Read and follow directions on labels of laundry or clothing items and detergent. In general, wash and dry with the warmest temperatures recommended on the label.  Clean all areas the individual has used often Clean all touchable surfaces, such as counters, tabletops, doorknobs, bathroom fixtures, toilets, phones, keyboards, tablets, and bedside tables, every day. Also, clean any surfaces that may have blood, body fluids, and/or secretions or excretions on them. Wear gloves when cleaning surfaces the patient has come in contact with. Use a diluted bleach solution (e.g., dilute bleach with 1 part bleach and 10 parts water) or a household disinfectant with a label that says EPA-registered for coronaviruses. To make  a bleach solution at home, add 1 tablespoon of bleach to 1 quart (4 cups) of water. For a larger supply, add  cup of bleach to 1 gallon (16 cups) of water. Read labels of cleaning products and follow recommendations provided on product labels. Labels contain instructions for safe and effective use of the cleaning product including precautions you should take when applying the product, such as wearing gloves or eye protection and making sure you  have good ventilation during use of the product. Remove gloves and wash hands immediately after cleaning.  Monitor yourself for signs and symptoms of illness Caregivers and household members are considered close contacts, should monitor their health, and will be asked to limit movement outside of the home to the extent possible. Follow the monitoring steps for close contacts listed on the symptom monitoring form.   ? If you have additional questions, contact your local health department or call the epidemiologist on call at [phone] (available 24/7). ? This guidance is subject to change. For the most up-to-date guidance from [facility], please refer to their website: [email or web address]

## 2019-04-15 NOTE — ED Provider Notes (Signed)
[facility] DEPT Provider Note   CSN: [id number] Arrival date & time: 04/15/19  1651     History   Chief Complaint Chief Complaint  Patient presents with  . Generalized Body Aches    HPI Courtney Clayton is a 48 y.o. female.     48 year old female with past medical history below including CHF, type 2 diabetes mellitus, hypertension, asthma who presents with body pain.  Patient states that over the past 2 days she has been moving a lot of furniture.  She is not sure whether she overexerted herself because today after waking up at 2 PM, she began having diffuse, generalized muscle aches and pains worse with movements.  She reports associated headache but attributes this to elevated blood pressure because she was not able to take her blood pressure medication today.  She states that she had some central chest pain starting last night that has been constant today in association with her body aches.  She denies shortness of breath, cough, sore throat, vomiting, diarrhea, fever, or sick contacts.  She has not taken any medications for her symptoms.  The history is provided by the patient.    Past Medical History:  Diagnosis Date  . Asthma   . CHF (congestive heart failure) ([place])   . Depression   . Diabetes mellitus without complication ([place])   . Hypertension     There are no active problems to display for this patient.   Past Surgical History:  Procedure Laterality Date  . ABDOMINAL HYSTERECTOMY    . BACK SURGERY    . GANGLION CYST EXCISION    . HERNIA REPAIR    . TONSILLECTOMY       OB History   No obstetric history on file.      Home Medications    Prior to Admission medications   Medication Sig Start Date End Date Taking? Authorizing Provider  buPROPion (WELLBUTRIN SR) 100 MG 12 hr tablet Take 100 mg by mouth 2 (two) times daily. 03/20/19  Yes [provider]  Butalbital-APAP-Caffeine 50-300-40 MG CAPS Take 1 capsule by  mouth every 4 (four) hours as needed (migraine).  02/17/19  Yes [provider]  cetirizine (ZYRTEC ALLERGY) 10 MG tablet Take 1 tablet (10 mg total) by mouth 2 (two) times daily for 5 days. 04/01/19 04/15/19 Yes Muthersbaugh, Jarrett Soho, PA-C  famotidine (PEPCID) 20 MG tablet Take 1 tablet (20 mg total) by mouth 2 (two) times daily. 04/01/19  Yes Muthersbaugh, Jarrett Soho, PA-C  losartan (COZAAR) 50 MG tablet Take 50 mg by mouth daily. 02/11/19  Yes [provider]  meloxicam (MOBIC) 15 MG tablet Take 15 mg by mouth daily as needed for pain.  02/11/19  Yes [provider]  metFORMIN (GLUCOPHAGE) 500 MG tablet Take 500 mg by mouth at bedtime. 03/20/19  Yes [provider]  Metoprolol Tartrate 75 MG TABS Take 75 tablets by mouth 2 (two) times daily. 03/15/19  Yes [provider]  OZEMPIC, 1 MG/DOSE, 2 MG/1.5ML SOPN Inject 1 mg into the skin once a week. 02/17/19  Yes [provider]  potassium chloride SA (KLOR-CON) 20 MEQ tablet Take 20 mEq by mouth 2 (two) times daily. 03/21/19  Yes [provider]  simvastatin (ZOCOR) 20 MG tablet Take 20 mg by mouth daily. 02/11/19  Yes [provider]  temazepam (RESTORIL) 30 MG capsule Take 30 mg by mouth at bedtime as needed for sleep.  02/11/19  Yes [provider]  cyclobenzaprine (FLEXERIL)  10 MG tablet Take 1 tablet (10 mg total) by mouth 3 (three) times daily as needed for muscle spasms. 04/15/19   Little, Ambrose Finlandachel Morgan, MD  hydrocortisone 2.5 % lotion Apply topically 2 (two) times daily. Patient not taking: Reported on 04/15/2019 04/01/19   Muthersbaugh, Dahlia ClientHannah, PA-C  predniSONE (DELTASONE) 20 MG tablet Take 2 tablets (40 mg total) by mouth daily. Patient not taking: Reported on 04/15/2019 04/01/19   Muthersbaugh, Dahlia ClientHannah, PA-C    Family History No family history on file.  Social History Social History   Tobacco Use  . Smoking status: Never Smoker  . Smokeless tobacco: Never Used   Substance Use Topics  . Alcohol use: Not on file  . Drug use: Not on file     Allergies   Sulfa antibiotics   Review of Systems Review of Systems All other systems reviewed and are negative except that which was mentioned in HPI   Physical Exam Updated Vital Signs BP (!) 154/83   Pulse 80   Temp 98.4 F (36.9 C) (Oral)   Resp 19   SpO2 99%   Physical Exam Vitals signs and nursing note reviewed.  Constitutional:      General: She is not in acute distress.    Appearance: She is well-developed.     Comments: Uncomfortable, laying on side  HENT:     Head: Normocephalic and atraumatic.  Eyes:     Conjunctiva/sclera: Conjunctivae normal.  Neck:     Musculoskeletal: Neck supple.  Cardiovascular:     Rate and Rhythm: Normal rate and regular rhythm.     Heart sounds: Normal heart sounds. No murmur.  Pulmonary:     Effort: Pulmonary effort is normal.     Breath sounds: Normal breath sounds.  Abdominal:     General: Bowel sounds are normal. There is no distension.     Palpations: Abdomen is soft.     Tenderness: There is no abdominal tenderness.  Skin:    General: Skin is warm and dry.  Neurological:     Mental Status: She is alert and oriented to person, place, and time.     Comments: Fluent speech  Psychiatric:        Judgment: Judgment normal.      ED Treatments / Results  Labs (all labs ordered are listed, but only abnormal results are displayed) Labs Reviewed  COMPREHENSIVE METABOLIC PANEL - Abnormal; Notable for the following components:      Result Value   Potassium 3.4 (*)    Glucose, Bld 118 (*)    Calcium 8.8 (*)    All other components within normal limits  CK - Abnormal; Notable for the following components:   Total CK 276 (*)    All other components within normal limits  NOVEL CORONAVIRUS, NAA (HOSP ORDER, SEND-OUT TO REF LAB; TAT 18-24 HRS)  CBC WITH DIFFERENTIAL/PLATELET  POC SARS CORONAVIRUS 2 AG -  ED  TROPONIN I (HIGH SENSITIVITY)   TROPONIN I (HIGH SENSITIVITY)    EKG EKG Interpretation  Date/Time:  Sunday April 15 2019 17:58:12 EST Ventricular Rate:  82 PR Interval:    QRS Duration: 90 QT Interval:  399 QTC Calculation: 466 R Axis:   29 Text Interpretation: Sinus rhythm Anteroseptal infarct, old No previous ECGs available Confirmed by Frederick PeersLittle, Rachel [id number]) on 04/15/2019 6:33:43 PM   Radiology Dg Chest Port 1 View  Result Date: 04/15/2019 CLINICAL DATA:  Muscle spasms and pain after moving furniture yesterday, fever, hypertension, diabetes mellitus, asthma, CHF  EXAM: PORTABLE CHEST 1 VIEW COMPARISON:  Portable exam 1831 hours without priors for comparison FINDINGS: Normal heart size, mediastinal contours, and pulmonary vascularity. Rotated to the LEFT. Lungs grossly clear. No pleural effusion or pneumothorax. Scattered degenerative changes of the cervical and thoracic spine. IMPRESSION: No acute abnormalities. Electronically Signed   By: Ulyses Southward M.D.   On: 04/15/2019 19:12    Procedures Procedures (including critical care time)  Medications Ordered in ED Medications  ketorolac (TORADOL) 15 MG/ML injection 15 mg (15 mg Intravenous Given 04/15/19 1752)  diazepam (VALIUM) tablet 5 mg (5 mg Oral Given 04/15/19 1752)  ketorolac (TORADOL) 15 MG/ML injection 15 mg (15 mg Intravenous Given 04/15/19 2037)  LORazepam (ATIVAN) injection 1 mg (1 mg Intravenous Given 04/15/19 2036)     Initial Impression / Assessment and Plan / ED Course  I have reviewed the triage vital signs and the nursing notes.  Pertinent labs & imaging results that were available during my care of the patient were reviewed by me and considered in my medical decision making (see chart for details).        Patient was uncomfortable but nontoxic on exam, hypertensive.  EKG without acute ischemic changes.  Lab work shows negative serial troponins, unremarkable CMP, normal CBC, CK mildly elevated above cutoff of normal at 276.  Point-of-care  COVID-19 test negative, send out test is pending.  I explained the possibility of COVID-19 infection given current pandemic and recommended strict quarantine until results are available.  Chest x-ray is clear.  Patient feeling better after receiving muscle relaxants and Toradol.  I have discussed supportive measures at home for her symptoms.  Chest pain description seems very atypical, has been constant and ongoing in the setting of diffuse body aches.  I feel symptoms very unlikely to represent ACS.  I have extensively reviewed return precautions with the patient and she voiced understanding.  Courtney Clayton was evaluated in Emergency Department on 04/15/2019 for the symptoms described in the history of present illness. She was evaluated in the context of the global COVID-19 pandemic, which necessitated consideration that the patient might be at risk for infection with the SARS-CoV-2 virus that causes COVID-19. Institutional protocols and algorithms that pertain to the evaluation of patients at risk for COVID-19 are in a state of rapid change based on information released by regulatory bodies including the CDC and federal and state organizations. These policies and algorithms were followed during the patient's care in the ED.   Final Clinical Impressions(s) / ED Diagnoses   Final diagnoses:  Generalized body aches  Muscle spasm    ED Discharge Orders         Ordered    cyclobenzaprine (FLEXERIL) 10 MG tablet  3 times daily PRN     04/15/19 2318           Little, Ambrose Finland, MD 04/15/19 2320

## 2019-04-17 LAB — NOVEL CORONAVIRUS, NAA (HOSP ORDER, SEND-OUT TO REF LAB; TAT 18-24 HRS): SARS-CoV-2, NAA: NOT DETECTED

## 2019-06-06 IMAGING — MR MR KNEE*R* W/O CM
4 of 6 series · 23 of 40 positions shown · non-contrast
Comparison: None.

CLINICAL DATA: Bilateral knee pain with swelling x2 years, right
worse than left. Pain is made worse by walking up and down stairs.
injections.

EXAM:
MRI OF THE RIGHT KNEE WITHOUT CONTRAST
TECHNIQUE: Multiplanar, multisequence MR imaging of the knee was performed. No
intravenous contrast was administered.

[Series 5: T1 · coronal · 4.0mm · 0.29mm/px · 3 of 22 slices shown]
[im 5/22]
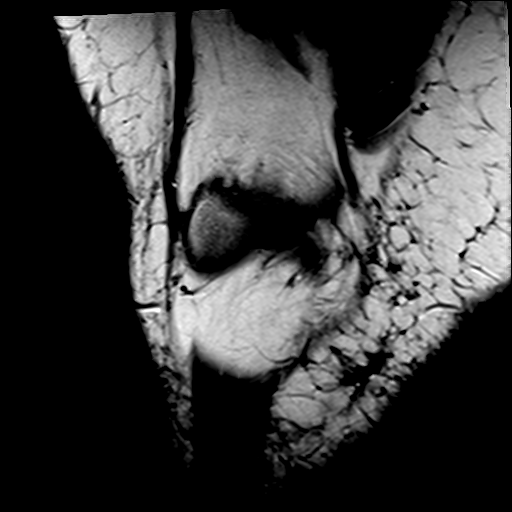
[im 13/22]
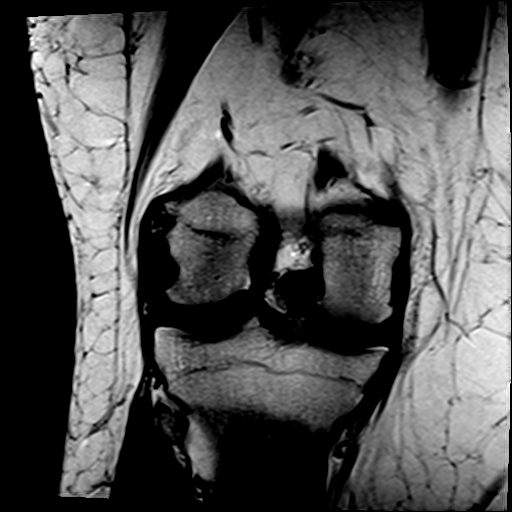
[im 22/22]
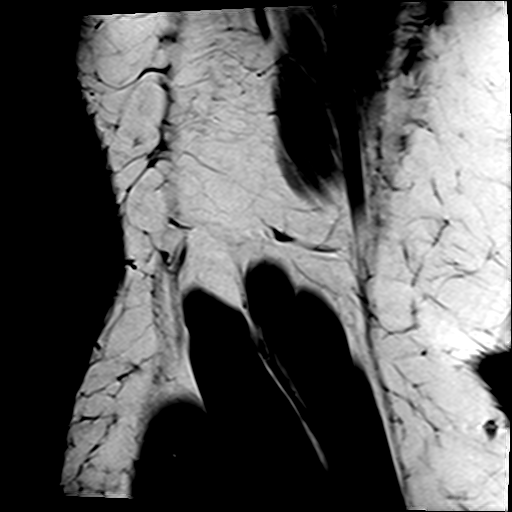

[Series 6: T2 fat-sat · coronal · 4.0mm · 0.59mm/px · 6 of 22 slices shown (1 of 2)]
[im 1/22]
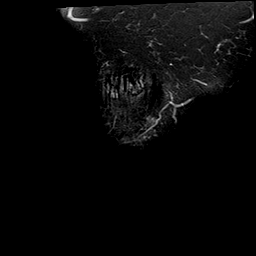
[im 5/22]
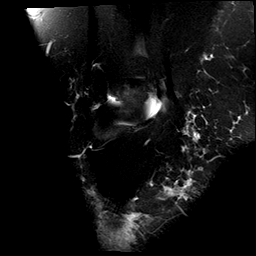
[im 9/22]
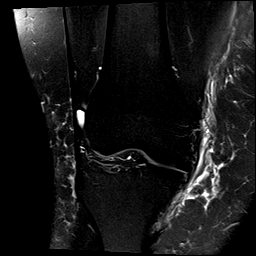
[im 13/22]
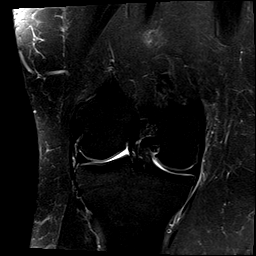
[im 17/22]
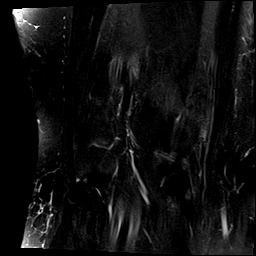
[im 22/22]
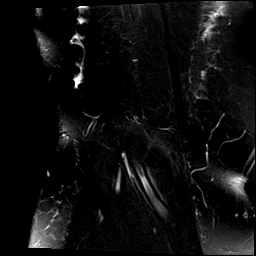

[Series 8: PD fat-sat · sagittal · 3.0mm · 0.29mm/px · 7 of 24 slices shown]
[im 1/24]
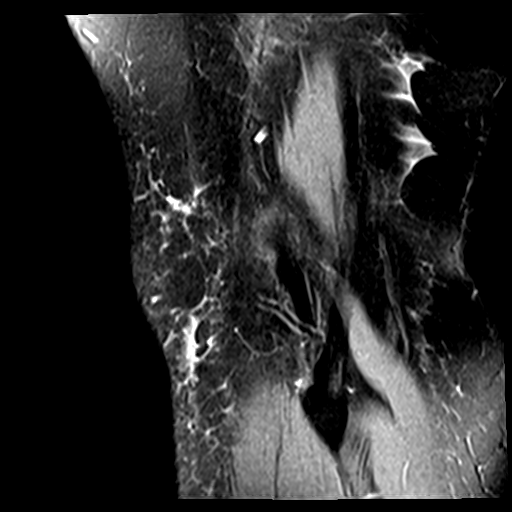
[im 4/24]
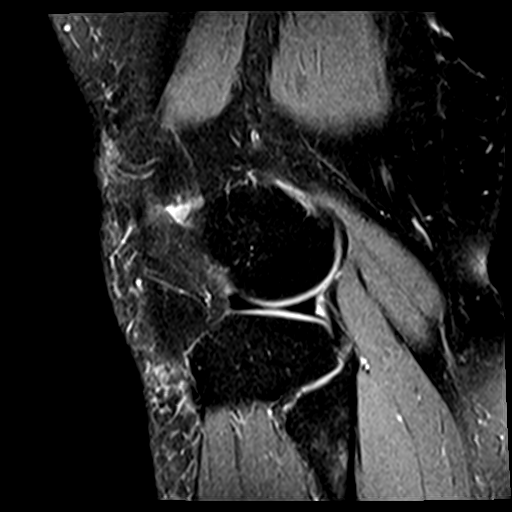
[im 8/24]
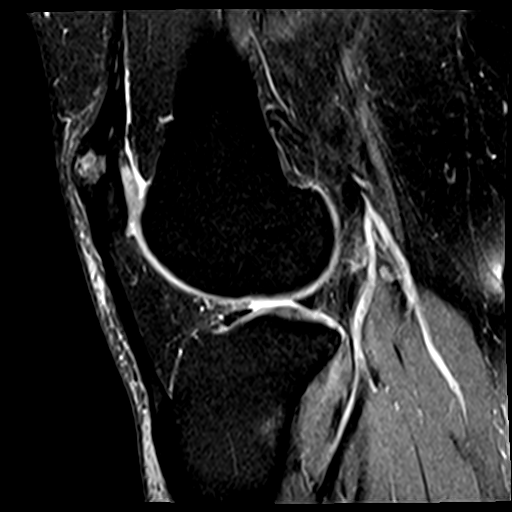
[im 12/24]
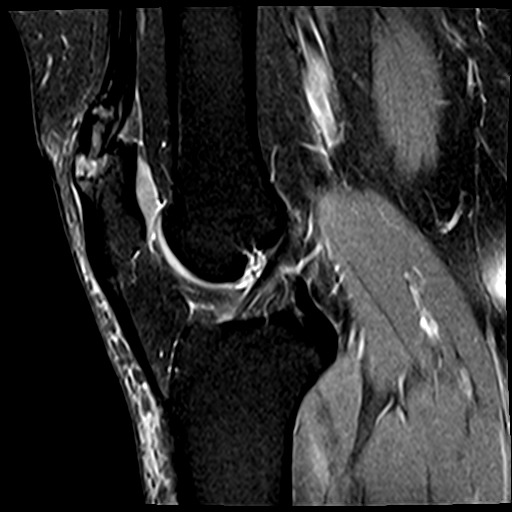
[im 16/24]
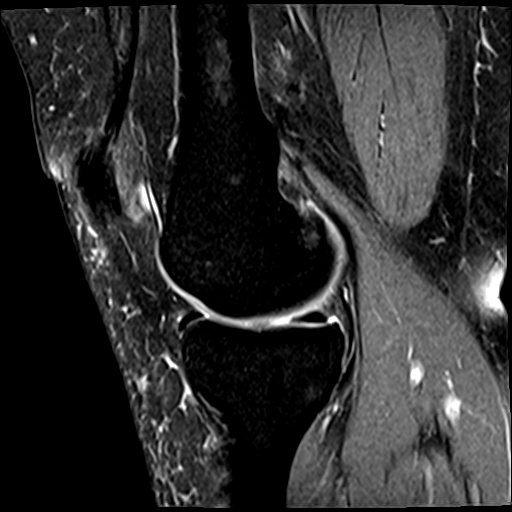
[im 20/24]
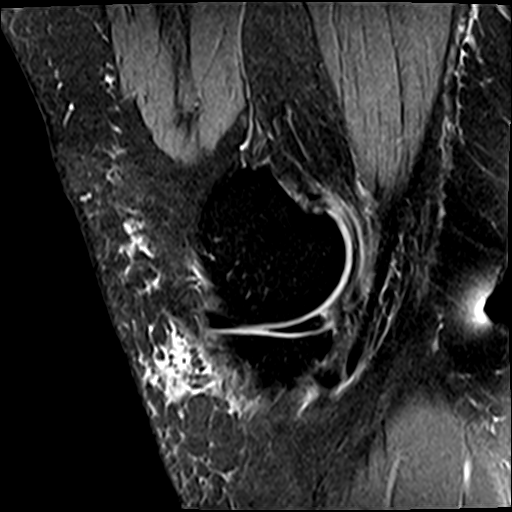
[im 24/24]
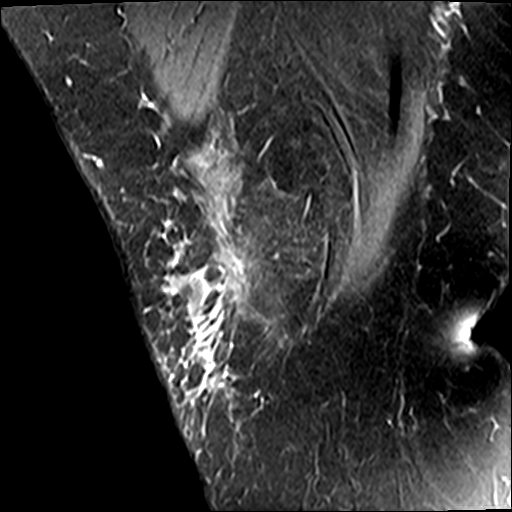

[Series 9: T2 fat-sat · sagittal · 3.0mm · 0.29mm/px · 7 of 24 slices shown (2 of 2)]
[im 1/24]
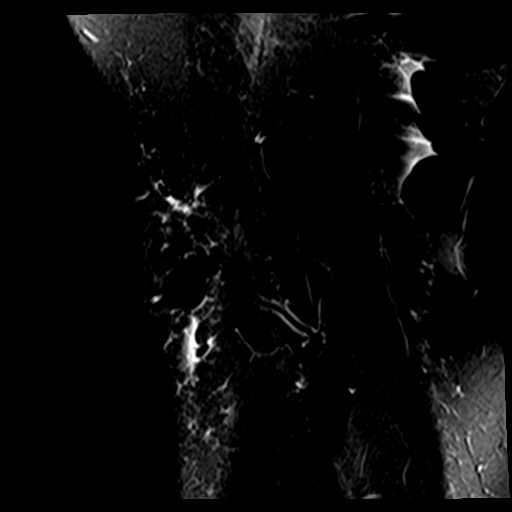
[im 4/24]
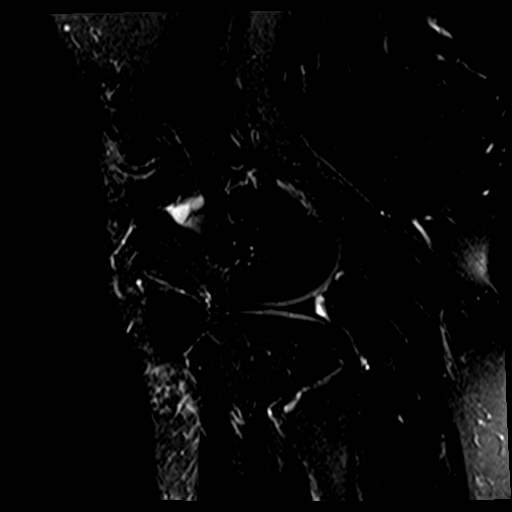
[im 8/24]
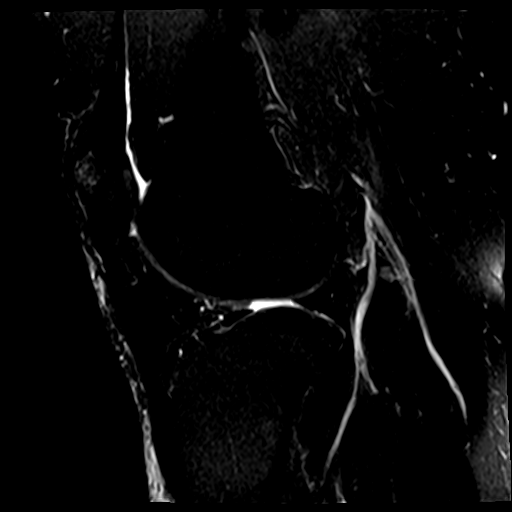
[im 12/24]
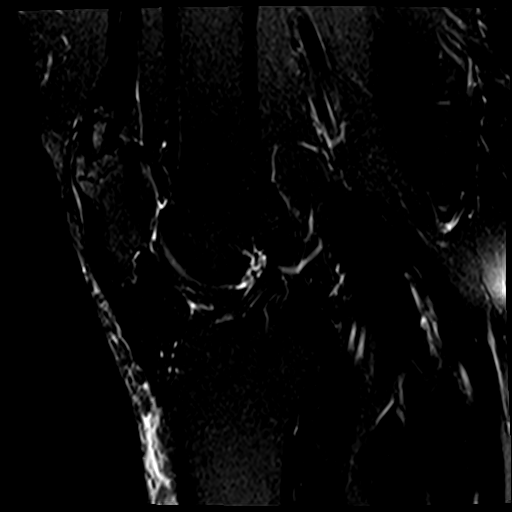
[im 16/24]
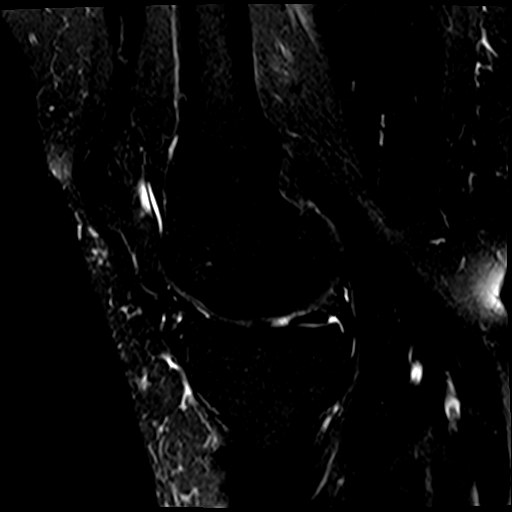
[im 20/24]
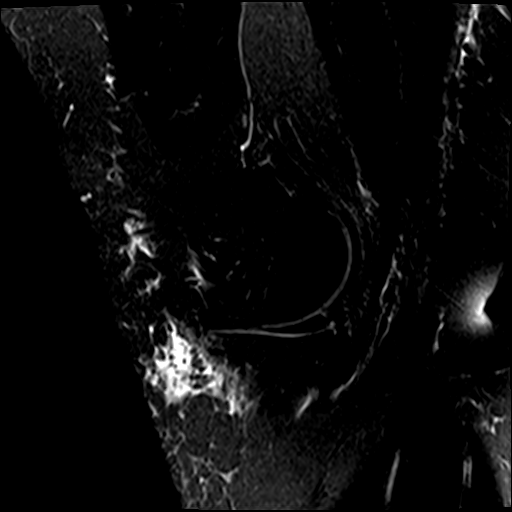
[im 24/24]
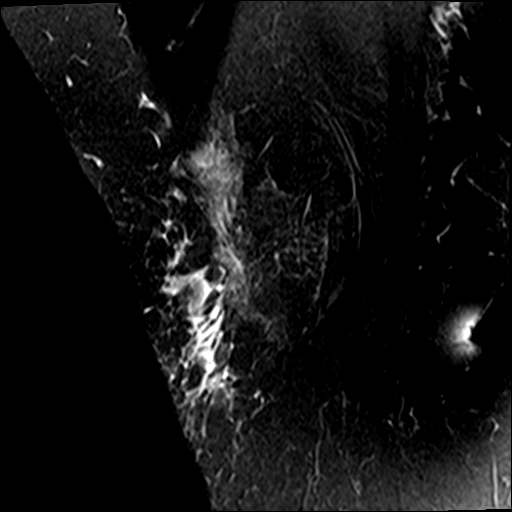

[23 of 40 positions shown; findings below may reference images not displayed]

FINDINGS: MENISCI

Medial meniscus:  Intact

Lateral meniscus:  Intact

LIGAMENTS

Cruciates:  Intact ACL and PCL.

Collaterals: Medial collateral ligament is intact. Lateral
collateral ligament complex is intact.

CARTILAGE

Patellofemoral:  No chondral defect.

Medial:  Intact

Lateral:  Intact

Joint:  No joint effusion. Normal Hoffa's fat. No plical thickening.

Popliteal Fossa:  No Baker cyst. Intact popliteus tendon.

Extensor Mechanism: Abnormal thickening of the distal quadriceps
near its insertion on the patella with partial as well as
intrasubstance tear noted.

Bones: Reactive changes of the upper pole of the anterior patella
secondary to the quadriceps tear and tendinosis.

Other: Soft tissue edema along the anterior aspect of the knee.
IMPRESSION: 1. Thickened abnormal appearance of the distal quadriceps near its
patellar attachment with partial as well as intrasubstance tear
noted. No retraction is seen. Reactive marrow edema and cortical
changes of the upper pole patella at the quadriceps insertion.
2. Intact cruciate and collateral ligaments.  Intact menisci.

## 2019-07-10 ENCOUNTER — Emergency Department (HOSPITAL_COMMUNITY)
Admission: EM | Admit: 2019-07-10 | Discharge: 2019-07-10 | Disposition: A | Discharge status: Home / Self Care | Payer: Managed Care, Other (non HMO) | Attending: Emergency Medicine | Admitting: Emergency Medicine

## 2019-07-10 ENCOUNTER — Emergency Department (HOSPITAL_COMMUNITY): Payer: Managed Care, Other (non HMO)

## 2019-07-10 ENCOUNTER — Other Ambulatory Visit: Payer: Self-pay

## 2019-07-10 ENCOUNTER — Encounter (HOSPITAL_COMMUNITY): Payer: Self-pay | Admitting: Family Medicine

## 2019-07-10 DIAGNOSIS — I110 Hypertensive heart disease with heart failure: Secondary | ICD-10-CM | POA: Insufficient documentation

## 2019-07-10 DIAGNOSIS — R519 Headache, unspecified: Secondary | ICD-10-CM | POA: Diagnosis not present

## 2019-07-10 DIAGNOSIS — Z79899 Other long term (current) drug therapy: Secondary | ICD-10-CM | POA: Diagnosis not present

## 2019-07-10 DIAGNOSIS — I509 Heart failure, unspecified: Secondary | ICD-10-CM | POA: Insufficient documentation

## 2019-07-10 DIAGNOSIS — Z7984 Long term (current) use of oral hypoglycemic drugs: Secondary | ICD-10-CM | POA: Diagnosis not present

## 2019-07-10 DIAGNOSIS — I10 Essential (primary) hypertension: Secondary | ICD-10-CM

## 2019-07-10 DIAGNOSIS — E119 Type 2 diabetes mellitus without complications: Secondary | ICD-10-CM | POA: Diagnosis not present

## 2019-07-10 DIAGNOSIS — R072 Precordial pain: Secondary | ICD-10-CM

## 2019-07-10 LAB — COMPREHENSIVE METABOLIC PANEL
ALT: 14 U/L (ref 0–44)
AST: 23 U/L (ref 15–41)
Albumin: 3.4 g/dL — ABNORMAL LOW (ref 3.5–5.0)
Alkaline Phosphatase: 68 U/L (ref 38–126)
Anion gap: 8 (ref 5–15)
BUN: 10 mg/dL (ref 6–20)
CO2: 25 mmol/L (ref 22–32)
Calcium: 8.4 mg/dL — ABNORMAL LOW (ref 8.9–10.3)
Chloride: 104 mmol/L (ref 98–111)
Creatinine, Ser: 0.87 mg/dL (ref 0.44–1.00)
GFR calc Af Amer: >60 mL/min (ref >60)
GFR calc non Af Amer: >60 mL/min (ref >60)
Glucose, Bld: 217 mg/dL — ABNORMAL HIGH (ref 70–99)
Potassium: 3.5 mmol/L (ref 3.5–5.1)
Sodium: 137 mmol/L (ref 135–145)
Total Bilirubin: 0.7 mg/dL (ref 0.3–1.2)
Total Protein: 7.2 g/dL (ref 6.5–8.1)

## 2019-07-10 LAB — CBC
HCT: 42.1 % (ref 36.0–46.0)
Hemoglobin: 13 g/dL (ref 12.0–15.0)
MCH: 26.5 pg (ref 26.0–34.0)
MCHC: 30.9 g/dL (ref 30.0–36.0)
MCV: 85.7 fL (ref 80.0–100.0)
Platelets: 322 10*3/uL (ref 150–400)
RBC: 4.91 MIL/uL (ref 3.87–5.11)
RDW: 14.3 % (ref 11.5–15.5)
WBC: 8.1 10*3/uL (ref 4.0–10.5)
nRBC: 0 % (ref 0.0–0.2)

## 2019-07-10 LAB — BRAIN NATRIURETIC PEPTIDE: B Natriuretic Peptide: 74.4 pg/mL (ref 0.0–100.0)

## 2019-07-10 LAB — TROPONIN I (HIGH SENSITIVITY)
Troponin I (High Sensitivity): 2 ng/L (ref <18)
Troponin I (High Sensitivity): <2 ng/L (ref <18)

## 2019-07-10 MED ORDER — SODIUM CHLORIDE 0.9 % IV BOLUS
500.0000 mL | Freq: Once | INTRAVENOUS | Status: AC
  Administered 2019-07-10: 500 mL via INTRAVENOUS

## 2019-07-10 MED ORDER — DIPHENHYDRAMINE HCL 50 MG/ML IJ SOLN
25.0000 mg | Freq: Once | INTRAMUSCULAR | Status: AC
  Administered 2019-07-10: 25 mg via INTRAVENOUS
  Filled 2019-07-10: qty 1

## 2019-07-10 MED ORDER — METOCLOPRAMIDE HCL 5 MG/ML IJ SOLN
10.0000 mg | Freq: Once | INTRAMUSCULAR | Status: AC
  Administered 2019-07-10: 10 mg via INTRAVENOUS
  Filled 2019-07-10: qty 2

## 2019-07-10 MED ORDER — SODIUM CHLORIDE 0.9 % IV SOLN: INTRAVENOUS | Status: DC

## 2019-07-10 MED ORDER — SODIUM CHLORIDE 0.9% FLUSH
3.0000 mL | Freq: Once | INTRAVENOUS | Status: AC
  Administered 2019-07-10: 3 mL via INTRAVENOUS

## 2019-07-10 MED ORDER — DEXAMETHASONE SODIUM PHOSPHATE 10 MG/ML IJ SOLN
10.0000 mg | Freq: Once | INTRAMUSCULAR | Status: AC
  Administered 2019-07-10: 10 mg via INTRAVENOUS
  Filled 2019-07-10: qty 1

## 2019-07-10 NOTE — Discharge Instructions (Addendum)
Continue medications as previously prescribed. ° °Follow-up with your primary doctor if symptoms are not improving in the next few days. °

## 2019-07-10 NOTE — ED Provider Notes (Signed)
[facility]-EMERGENCY DEPT Provider Note   CSN: [id number] Arrival date & time: 07/10/19  1251     History Chief Complaint  Patient presents with  . Chest Pain  . Headache    Courtney Clayton is a 49 y.o. female.  Patient presenting with a complaint headache and chest pain and some shortness of breath.  Chest pain started Saturday night substernal in nature pressure and sharp last less than 5 minutes but it occurring on and off all day.  Shortness of breath started the same time.  Headache which is somewhat like her past migraines does have photophobia has been ongoing for 3 days but most of the time her migraines hurt more in the forehead area this is in the back of the head so it is a little different.  Oxygen saturation on room air is 98%.  Patient has a known history of hypertension.  She is to be taking meta propyl olmesartan and amlodipine she has been out of the amlodipine she is not sure of her dose or how often she takes it.  Patient denies any nausea or vomiting.  Has no numbness or weakness or any visual changes other than the photophobia.  Past medical history significant for hypertension asthma congestive heart failure and diabetes.        Past Medical History:  Diagnosis Date  . Asthma   . CHF (congestive heart failure) (HCC)   . Depression   . Diabetes mellitus without complication (HCC)   . Hypertension     There are no problems to display for this patient.   Past Surgical History:  Procedure Laterality Date  . ABDOMINAL HYSTERECTOMY    . BACK SURGERY    . GANGLION CYST EXCISION    . HERNIA REPAIR    . TONSILLECTOMY       OB History   No obstetric history on file.     History reviewed. No pertinent family history.  Social History   Tobacco Use  . Smoking status: Never Smoker  . Smokeless tobacco: Never Used  Substance Use Topics  . Alcohol use: Yes    Comment: 1 glass of wine "here and there"  . Drug use: Never     Home Medications Prior to Admission medications   Medication Sig Start Date End Date Taking? Authorizing Provider  buPROPion (WELLBUTRIN SR) 100 MG 12 hr tablet Take 100 mg by mouth 2 (two) times daily. 03/20/19   [provider]  Butalbital-APAP-Caffeine 50-300-40 MG CAPS Take 1 capsule by mouth every 4 (four) hours as needed (migraine).  02/17/19   [provider]  cetirizine (ZYRTEC ALLERGY) 10 MG tablet Take 1 tablet (10 mg total) by mouth 2 (two) times daily for 5 days. 04/01/19 04/15/19  Muthersbaugh, Dahlia Client, PA-C  cyclobenzaprine (FLEXERIL) 10 MG tablet Take 1 tablet (10 mg total) by mouth 3 (three) times daily as needed for muscle spasms. 04/15/19   Little, Ambrose Finland, MD  famotidine (PEPCID) 20 MG tablet Take 1 tablet (20 mg total) by mouth 2 (two) times daily. 04/01/19   Muthersbaugh, Dahlia Client, PA-C  hydrocortisone 2.5 % lotion Apply topically 2 (two) times daily. Patient not taking: Reported on 04/15/2019 04/01/19   Muthersbaugh, Dahlia Client, PA-C  losartan (COZAAR) 50 MG tablet Take 50 mg by mouth daily. 02/11/19   [provider]  meloxicam (MOBIC) 15 MG tablet Take 15 mg by mouth daily as needed for pain.  02/11/19   [provider]  metFORMIN (GLUCOPHAGE) 500 MG  tablet Take 500 mg by mouth at bedtime. 03/20/19   [provider]  Metoprolol Tartrate 75 MG TABS Take 75 tablets by mouth 2 (two) times daily. 03/15/19   [provider]  OZEMPIC, 1 MG/DOSE, 2 MG/1.5ML SOPN Inject 1 mg into the skin once a week. 02/17/19   [provider]  potassium chloride SA (KLOR-CON) 20 MEQ tablet Take 20 mEq by mouth 2 (two) times daily. 03/21/19   [provider]  predniSONE (DELTASONE) 20 MG tablet Take 2 tablets (40 mg total) by mouth daily. Patient not taking: Reported on 04/15/2019 04/01/19   Muthersbaugh, Dahlia Client, PA-C  simvastatin (ZOCOR) 20 MG tablet Take 20 mg by mouth daily. 02/11/19   [provider]  temazepam  (RESTORIL) 30 MG capsule Take 30 mg by mouth at bedtime as needed for sleep.  02/11/19   [provider]    Allergies    Sulfa antibiotics  Review of Systems   Review of Systems  Constitutional: Negative for chills and fever.  HENT: Negative for congestion, rhinorrhea and sore throat.   Eyes: Negative for visual disturbance.  Respiratory: Positive for shortness of breath. Negative for cough.   Cardiovascular: Positive for chest pain. Negative for leg swelling.  Gastrointestinal: Negative for abdominal pain, diarrhea, nausea and vomiting.  Genitourinary: Negative for dysuria.  Musculoskeletal: Negative for back pain and neck pain.  Skin: Negative for rash.  Neurological: Positive for headaches. Negative for dizziness and light-headedness.  Hematological: Does not bruise/bleed easily.  Psychiatric/Behavioral: Negative for confusion.    Physical Exam Updated Vital Signs BP (!) 185/93   Pulse 67   Temp 98.4 F (36.9 C) (Oral)   Resp 19   Ht 1.651 m (5\' 5" )   Wt (!) 137.4 kg   SpO2 97%   BMI 50.42 kg/m   Physical Exam Vitals and nursing note reviewed.  Constitutional:      General: She is not in acute distress.    Appearance: Normal appearance. She is well-developed.  HENT:     Head: Normocephalic and atraumatic.  Eyes:     Extraocular Movements: Extraocular movements intact.     Conjunctiva/sclera: Conjunctivae normal.     Pupils: Pupils are equal, round, and reactive to light.  Cardiovascular:     Rate and Rhythm: Normal rate and regular rhythm.     Heart sounds: No murmur.  Pulmonary:     Effort: Pulmonary effort is normal. No respiratory distress.     Breath sounds: Normal breath sounds.  Abdominal:     Palpations: Abdomen is soft.     Tenderness: There is no abdominal tenderness.  Musculoskeletal:        General: Swelling present.     Cervical back: Normal range of motion and neck supple.     Right lower leg: Edema present.     Left lower leg: Edema  present.  Skin:    General: Skin is warm and dry.     Capillary Refill: Capillary refill takes less than 2 seconds.  Neurological:     General: No focal deficit present.     Mental Status: She is alert and oriented to person, place, and time.     Cranial Nerves: No cranial nerve deficit.     Sensory: No sensory deficit.     Motor: No weakness.     ED Results / Procedures / Treatments   Labs (all labs ordered are listed, but only abnormal results are displayed) Labs Reviewed  COMPREHENSIVE METABOLIC PANEL -  Abnormal; Notable for the following components:      Result Value   Glucose, Bld 217 (*)    Calcium 8.4 (*)    Albumin 3.4 (*)    All other components within normal limits  CBC  BRAIN NATRIURETIC PEPTIDE  TROPONIN I (HIGH SENSITIVITY)  TROPONIN I (HIGH SENSITIVITY)    EKG None   ED ECG REPORT   Date: 07/10/2019  Rate: 75  Rhythm: normal sinus rhythm  QRS Axis: normal  Intervals: normal  ST/T Wave abnormalities: nonspecific T wave changes  Conduction Disutrbances:none  Narrative Interpretation:   Old EKG Reviewed: none available  I have personally reviewed the EKG tracing and agree with the computerized printout as noted.   Radiology DG Chest 2 View  Result Date: 07/10/2019 CLINICAL DATA:  Chest pain. EXAM: CHEST - 2 VIEW COMPARISON:  04/15/2019. FINDINGS: Mediastinum and hilar structures normal. Lungs are clear. Stable prominent bilateral first rib costochondral calcification with nodularity in these regions most consistent with costochondral calcification and overlying vascular shadows. No change from prior exam. No pleural effusion or pneumothorax. Heart size normal. Degenerative change thoracic spine. IMPRESSION: No acute cardiopulmonary disease. Electronically Signed   By: Marcello Moores  Register   On: 07/10/2019 13:42    Procedures Procedures (including critical care time)  Medications Ordered in ED Medications  0.9 %  sodium chloride infusion (has no  administration in time range)  sodium chloride 0.9 % bolus 500 mL (has no administration in time range)  dexamethasone (DECADRON) injection 10 mg (has no administration in time range)  diphenhydrAMINE (BENADRYL) injection 25 mg (has no administration in time range)  metoCLOPramide (REGLAN) injection 10 mg (has no administration in time range)  sodium chloride flush (NS) 0.9 % injection 3 mL (3 mLs Intravenous Given 07/10/19 1312)    ED Course  I have reviewed the triage vital signs and the nursing notes.  Pertinent labs & imaging results that were available during my care of the patient were reviewed by me and considered in my medical decision making (see chart for details).    MDM Rules/Calculators/A&P                     Patient's headache somewhat migraine-like but atypical for her location.  But the photophobia is very typical.  Will get CT head since she has had elevated blood pressure.  Blood pressure could be more elevated due to the headache.  Will treat with migraine cocktail.  Will not give a lot of fluids since she has a history of congestive heart failure.  Does have leg swelling but her BNP is not significantly elevated.  Chest x-ray negative.  Patient's initial troponin negative EKG without any acute findings.  We will reassess after delta troponin which is due at around 3:15 PM.  Will get head CT.  And see how she responds to the migraine cocktail.    Final Clinical Impression(s) / ED Diagnoses Final diagnoses:  Essential hypertension  Acute intractable headache, unspecified headache type  Precordial pain    Rx / DC Orders ED Discharge Orders    None       Fredia Sorrow, MD 07/10/19 1453

## 2019-07-10 NOTE — ED Provider Notes (Signed)
Care assumed from Dr. Deretha Emory at shift change.  Patient awaiting results of second troponin and CT scan of the head.  Both of these tests have returned unremarkable.  Patient appears to be feeling better after receiving a migraine cocktail.  At this point, I feel as though discharge is appropriate.  Patient to follow-up with primary doctor as needed.   Geoffery Lyons, MD 07/10/19 [phone]

## 2019-08-23 ENCOUNTER — Ambulatory Visit: Payer: 59

## 2019-10-29 ENCOUNTER — Encounter (HOSPITAL_COMMUNITY): Payer: Self-pay | Admitting: Emergency Medicine

## 2019-10-29 ENCOUNTER — Emergency Department (HOSPITAL_COMMUNITY): Payer: 59

## 2019-10-29 ENCOUNTER — Emergency Department (HOSPITAL_COMMUNITY)
Admission: EM | Admit: 2019-10-29 | Discharge: 2019-10-29 | Disposition: A | Discharge status: Home / Self Care | Payer: 59 | Attending: Emergency Medicine | Admitting: Emergency Medicine

## 2019-10-29 ENCOUNTER — Other Ambulatory Visit: Payer: Self-pay

## 2019-10-29 DIAGNOSIS — J069 Acute upper respiratory infection, unspecified: Secondary | ICD-10-CM | POA: Diagnosis not present

## 2019-10-29 DIAGNOSIS — R058 Other specified cough: Secondary | ICD-10-CM

## 2019-10-29 DIAGNOSIS — I509 Heart failure, unspecified: Secondary | ICD-10-CM | POA: Insufficient documentation

## 2019-10-29 DIAGNOSIS — I110 Hypertensive heart disease with heart failure: Secondary | ICD-10-CM | POA: Diagnosis not present

## 2019-10-29 DIAGNOSIS — E119 Type 2 diabetes mellitus without complications: Secondary | ICD-10-CM | POA: Insufficient documentation

## 2019-10-29 DIAGNOSIS — Z7984 Long term (current) use of oral hypoglycemic drugs: Secondary | ICD-10-CM | POA: Diagnosis not present

## 2019-10-29 DIAGNOSIS — Z20822 Contact with and (suspected) exposure to covid-19: Secondary | ICD-10-CM

## 2019-10-29 DIAGNOSIS — R05 Cough: Secondary | ICD-10-CM | POA: Diagnosis present

## 2019-10-29 LAB — CBC WITH DIFFERENTIAL/PLATELET
Abs Immature Granulocytes: 0.04 10*3/uL (ref 0.00–0.07)
Basophils Absolute: 0 10*3/uL (ref 0.0–0.1)
Basophils Relative: 0 %
Eosinophils Absolute: 0.2 10*3/uL (ref 0.0–0.5)
Eosinophils Relative: 2 %
HCT: 42.8 % (ref 36.0–46.0)
Hemoglobin: 13.3 g/dL (ref 12.0–15.0)
Immature Granulocytes: 0 %
Lymphocytes Relative: 52 %
Lymphs Abs: 5.3 10*3/uL — ABNORMAL HIGH (ref 0.7–4.0)
MCH: 26.8 pg (ref 26.0–34.0)
MCHC: 31.1 g/dL (ref 30.0–36.0)
MCV: 86.3 fL (ref 80.0–100.0)
Monocytes Absolute: 1 10*3/uL (ref 0.1–1.0)
Monocytes Relative: 9 %
Neutro Abs: 3.8 10*3/uL (ref 1.7–7.7)
Neutrophils Relative %: 37 %
Platelets: 278 10*3/uL (ref 150–400)
RBC: 4.96 MIL/uL (ref 3.87–5.11)
RDW: 14.7 % (ref 11.5–15.5)
WBC: 10.3 10*3/uL (ref 4.0–10.5)
nRBC: 0 % (ref 0.0–0.2)

## 2019-10-29 LAB — SARS CORONAVIRUS 2 BY RT PCR (HOSPITAL ORDER, PERFORMED IN ~~LOC~~ HOSPITAL LAB): SARS Coronavirus 2: NEGATIVE

## 2019-10-29 LAB — BASIC METABOLIC PANEL
Anion gap: 12 (ref 5–15)
BUN: 15 mg/dL (ref 6–20)
CO2: 25 mmol/L (ref 22–32)
Calcium: 8.7 mg/dL — ABNORMAL LOW (ref 8.9–10.3)
Chloride: 103 mmol/L (ref 98–111)
Creatinine, Ser: 0.91 mg/dL (ref 0.44–1.00)
GFR calc Af Amer: >60 mL/min (ref >60)
GFR calc non Af Amer: >60 mL/min (ref >60)
Glucose, Bld: 100 mg/dL — ABNORMAL HIGH (ref 70–99)
Potassium: 3.9 mmol/L (ref 3.5–5.1)
Sodium: 140 mmol/L (ref 135–145)

## 2019-10-29 LAB — TROPONIN I (HIGH SENSITIVITY): Troponin I (High Sensitivity): <2 ng/L (ref <18)

## 2019-10-29 MED ORDER — ACETAMINOPHEN 325 MG PO TABS
650.0000 mg | ORAL_TABLET | Freq: Once | ORAL | Status: AC
  Administered 2019-10-29: 650 mg via ORAL
  Filled 2019-10-29: qty 2

## 2019-10-29 NOTE — ED Provider Notes (Signed)
[facility] DEPT Provider Note   CSN: [id number] Arrival date & time: 10/29/19  1342     History Chief Complaint  Patient presents with  . Covid Exposure    Courtney Clayton is a 49 y.o. female.  She has a history of diabetes hypertension CHF.  Complaining of 4 days of low-grade fevers headache shortness of breath.  Family member tested positive for Covid.  She is concerned she may have contracted Covid.  Chest pain is mild and worse with coughing.  Nonproductive cough.  Headache waxes and wanes.  The history is provided by the patient.  Influenza Presenting symptoms: cough, fatigue, fever, headache and shortness of breath   Presenting symptoms: no sore throat   Severity:  Moderate Onset quality:  Gradual Progression:  Unchanged Chronicity:  New Relieved by:  None tried Worsened by:  Nothing Ineffective treatments:  None tried Associated symptoms: no chills, no mental status change, no neck stiffness and no syncope   Risk factors: diabetes, heart disease and sick contacts        Past Medical History:  Diagnosis Date  . Asthma   . CHF (congestive heart failure) ([place])   . Depression   . Diabetes mellitus without complication ([place])   . Hypertension     There are no problems to display for this patient.   Past Surgical History:  Procedure Laterality Date  . ABDOMINAL HYSTERECTOMY    . BACK SURGERY    . GANGLION CYST EXCISION    . HERNIA REPAIR    . TONSILLECTOMY       OB History   No obstetric history on file.     No family history on file.  Social History   Tobacco Use  . Smoking status: Never Smoker  . Smokeless tobacco: Never Used  Vaping Use  . Vaping Use: Never used  Substance Use Topics  . Alcohol use: Yes    Comment: 1 glass of wine "here and there"  . Drug use: Never    Home Medications Prior to Admission medications   Medication Sig Start Date End Date Taking? Authorizing Provider  buPROPion (WELLBUTRIN  SR) 100 MG 12 hr tablet Take 100 mg by mouth 2 (two) times daily. 03/20/19   [provider]  Butalbital-APAP-Caffeine 50-300-40 MG CAPS Take 1 capsule by mouth every 4 (four) hours as needed (migraine).  02/17/19   [provider]  cetirizine (ZYRTEC ALLERGY) 10 MG tablet Take 1 tablet (10 mg total) by mouth 2 (two) times daily for 5 days. 04/01/19 04/15/19  Muthersbaugh, Jarrett Soho, PA-C  cyclobenzaprine (FLEXERIL) 10 MG tablet Take 1 tablet (10 mg total) by mouth 3 (three) times daily as needed for muscle spasms. Patient not taking: Reported on 07/10/2019 04/15/19   Little, Wenda Overland, MD  famotidine (PEPCID) 20 MG tablet Take 1 tablet (20 mg total) by mouth 2 (two) times daily. Patient not taking: Reported on 07/10/2019 04/01/19   Muthersbaugh, Jarrett Soho, PA-C  hydrocortisone 2.5 % lotion Apply topically 2 (two) times daily. Patient not taking: Reported on 04/15/2019 04/01/19   Muthersbaugh, Jarrett Soho, PA-C  losartan (COZAAR) 50 MG tablet Take 50 mg by mouth daily. 02/11/19   [provider]  meloxicam (MOBIC) 15 MG tablet Take 15 mg by mouth daily as needed for pain.  02/11/19   [provider]  metFORMIN (GLUCOPHAGE) 500 MG tablet Take 500 mg by mouth at bedtime. 03/20/19   [provider]  Metoprolol Tartrate 75 MG TABS Take 75 tablets  by mouth 2 (two) times daily. 03/15/19   [provider]  OZEMPIC, 1 MG/DOSE, 2 MG/1.5ML SOPN Inject 1 mg into the skin once a week. 02/17/19   [provider]  potassium chloride SA (KLOR-CON) 20 MEQ tablet Take 20 mEq by mouth 2 (two) times daily. 03/21/19   [provider]  predniSONE (DELTASONE) 20 MG tablet Take 2 tablets (40 mg total) by mouth daily. Patient not taking: Reported on 04/15/2019 04/01/19   Muthersbaugh, Dahlia Client, PA-C  simvastatin (ZOCOR) 20 MG tablet Take 20 mg by mouth daily. 02/11/19   [provider]  temazepam (RESTORIL) 30 MG capsule Take 30 mg by mouth at bedtime as needed  for sleep.  02/11/19   [provider]    Allergies    Sulfa antibiotics  Review of Systems   Review of Systems  Constitutional: Positive for fatigue and fever. Negative for chills.  HENT: Negative for sore throat.   Eyes: Negative for visual disturbance.  Respiratory: Positive for cough and shortness of breath.   Cardiovascular: Negative for chest pain.  Gastrointestinal: Negative for abdominal pain.  Genitourinary: Negative for dysuria.  Musculoskeletal: Negative for neck stiffness.  Skin: Negative for rash.  Neurological: Positive for headaches.    Physical Exam Updated Vital Signs BP (!) 165/115 (BP Location: Left Arm)   Pulse 88   Temp 99 F (37.2 C) (Oral)   Resp 18   SpO2 95%   Physical Exam Vitals and nursing note reviewed.  Constitutional:      General: She is not in acute distress.    Appearance: Normal appearance. She is well-developed.  HENT:     Head: Normocephalic and atraumatic.  Eyes:     Conjunctiva/sclera: Conjunctivae normal.  Cardiovascular:     Rate and Rhythm: Normal rate and regular rhythm.     Heart sounds: No murmur heard.   Pulmonary:     Effort: Pulmonary effort is normal. No respiratory distress.     Breath sounds: Normal breath sounds.  Abdominal:     Palpations: Abdomen is soft.     Tenderness: There is no abdominal tenderness.  Musculoskeletal:        General: No deformity or signs of injury. Normal range of motion.     Cervical back: Neck supple. No rigidity or tenderness.  Skin:    General: Skin is warm and dry.     Capillary Refill: Capillary refill takes less than 2 seconds.  Neurological:     General: No focal deficit present.     Mental Status: She is alert.     Gait: Gait normal.     ED Results / Procedures / Treatments   Labs (all labs ordered are listed, but only abnormal results are displayed) Labs Reviewed  BASIC METABOLIC PANEL - Abnormal; Notable for the following components:      Result Value    Glucose, Bld 100 (*)    Calcium 8.7 (*)    All other components within normal limits  CBC WITH DIFFERENTIAL/PLATELET - Abnormal; Notable for the following components:   Lymphs Abs 5.3 (*)    All other components within normal limits  SARS CORONAVIRUS 2 BY RT PCR [facility] ORDER, PERFORMED IN [facility] LAB)  TROPONIN I (HIGH SENSITIVITY)    EKG EKG Interpretation  Date/Time:  Monday October 29 2019 17:43:14 EDT Ventricular Rate:  64 PR Interval:    QRS Duration: 96 QT Interval:  434 QTC Calculation: 448 R Axis:   -26 Text Interpretation:  Sinus rhythm Borderline left axis deviation Abnormal R-wave progression, late transition Borderline T wave abnormalities 12 Lead; Mason-Likar No significant change since prior 12/20 Confirmed by Meridee Score [phone]) on 10/29/2019 5:46:53 PM   Radiology DG Chest Port 1 View  Result Date: 10/29/2019 CLINICAL DATA:  Headache, fever EXAM: PORTABLE CHEST 1 VIEW COMPARISON:  09/09/2018 FINDINGS: 1 Normal mediastinum and cardiac silhouette. Normal pulmonary vasculature. No evidence of effusion, infiltrate, or pneumothorax. No acute bony abnormality. IMPRESSION: No acute cardiopulmonary process. Electronically Signed   By: Genevive Bi M.D.   On: 10/29/2019 17:53    Procedures Procedures (including critical care time)  Medications Ordered in ED Medications  acetaminophen (TYLENOL) tablet 650 mg (650 mg Oral Given 10/29/19 1735)    ED Course  I have reviewed the triage vital signs and the nursing notes.  Pertinent labs & imaging results that were available during my care of the patient were reviewed by me and considered in my medical decision making (see chart for details).  Clinical Course as of Oct 30 1054  Mon Oct 29, 2019  1733 Chest x-ray interpreted by me as no acute infiltrates.   [MB]  1838 Initial troponin is less than 2.  I think her symptoms are all more infectious in nature and not cardiac.  Do not think she needs a delta  troponin.  Will discharge home with clear return instructions.   [MB]    Clinical Course User Index [MB] Terrilee Files, MD   MDM Rules/Calculators/A&P                         This patient complains of fever headache shortness of breath cough Covid exposure; this involves an extensive number of treatment Options and is a complaint that carries with it a high risk of complications and Morbidity. The differential includes Covid, pneumonia, ACS, PE, pneumo thorax  I ordered, reviewed and interpreted labs, which included CBC with normal white count normal hemoglobin, chemistries normal, troponin undetectable, Covid testing negative I ordered medication Tylenol for her headache I ordered imaging studies which included chest x-ray and I independently    visualized and interpreted imaging which showed no acute disease Previous records obtained and reviewed in epic  After the interventions stated above, I reevaluated the patient and found patient to be hemodynamically stable satting 99% on room air.  No evidence of ACS.  Believe only needs 1 troponin as it did not sound cardiac.  Doubt PE not tachycardic or tachypneic not hypoxic.  Covid testing pending at time of discharge, ultimately it was negative.  Return instructions discussed.  YALISSA FINK was evaluated in Emergency Department on 10/29/2019 for the symptoms described in the history of present illness. She was evaluated in the context of the global COVID-19 pandemic, which necessitated consideration that the patient might be at risk for infection with the SARS-CoV-2 virus that causes COVID-19. Institutional protocols and algorithms that pertain to the evaluation of patients at risk for COVID-19 are in a state of rapid change based on information released by regulatory bodies including the CDC and federal and state organizations. These policies and algorithms were followed during the patient's care in the ED.   Final Clinical Impression(s)  / ED Diagnoses Final diagnoses:  Cough with exposure to COVID-19 virus    Rx / DC Orders ED Discharge Orders    None       Terrilee Files, MD 10/30/19 1058

## 2019-10-29 NOTE — Discharge Instructions (Addendum)
You were seen in the emergency department after being exposed to Covid and having symptoms suspicious for Covid.  Your chest x-ray did not show any obvious pneumonia.  Your cardiac testing did not show any obvious heart injury.  Please drink plenty of fluids and use Tylenol as needed for pain.  Over-the-counter cough medicine.  [facility] should contact you for outpatient follow-up, but if you test is positive please contact the clinic at [phone].  Return to the emergency department if any worsening or concerning symptoms

## 2019-10-29 NOTE — ED Notes (Signed)
An After Visit Summary was printed and given to the patient. Discharge instructions given and no further questions at this time.  

## 2019-10-29 NOTE — ED Triage Notes (Signed)
Per pt, states states headache, fever, congestion for about 4 days-states family members have tested positive for Covid

## 2019-12-04 ENCOUNTER — Emergency Department (HOSPITAL_COMMUNITY)
Admission: EM | Admit: 2019-12-04 | Discharge: 2019-12-05 | Disposition: A | Discharge status: Home / Self Care | Payer: 59 | Attending: Emergency Medicine | Admitting: Emergency Medicine

## 2019-12-04 ENCOUNTER — Other Ambulatory Visit: Payer: Self-pay

## 2019-12-04 ENCOUNTER — Encounter (HOSPITAL_COMMUNITY): Payer: Self-pay | Admitting: *Deleted

## 2019-12-04 DIAGNOSIS — I10 Essential (primary) hypertension: Secondary | ICD-10-CM | POA: Diagnosis not present

## 2019-12-04 DIAGNOSIS — R0789 Other chest pain: Secondary | ICD-10-CM | POA: Diagnosis not present

## 2019-12-04 DIAGNOSIS — Z7984 Long term (current) use of oral hypoglycemic drugs: Secondary | ICD-10-CM | POA: Diagnosis not present

## 2019-12-04 DIAGNOSIS — E119 Type 2 diabetes mellitus without complications: Secondary | ICD-10-CM | POA: Insufficient documentation

## 2019-12-04 DIAGNOSIS — R112 Nausea with vomiting, unspecified: Secondary | ICD-10-CM | POA: Diagnosis not present

## 2019-12-04 DIAGNOSIS — Z5321 Procedure and treatment not carried out due to patient leaving prior to being seen by health care provider: Secondary | ICD-10-CM | POA: Diagnosis not present

## 2019-12-04 HISTORY — DX: Old myocardial infarction: I25.2

## 2019-12-04 MED ORDER — SODIUM CHLORIDE 0.9% FLUSH: 3.0000 mL | Freq: Once | INTRAVENOUS | Status: DC

## 2019-12-04 NOTE — ED Triage Notes (Signed)
Pt arrives via [facility] with c/o chest pain on and off all day. Pressure, radiates to back. Nausea, vomiting x 1. Hx of MI and HTN. 186 palpated initially, improved to 160 palpated. 324 ASA and 2 nitro. Initial pain 7/10, now 2/10. IV in the right AC. NSR on EKG. CBG 168. Hx of diabetes, on metformin.

## 2019-12-04 NOTE — ED Triage Notes (Signed)
Pt says she started having some pain on Sunday. Today the pain became worse. It is in the center of her chest, radiates to her back and to the right shoulder blade. Having a lot of nausea.

## 2019-12-05 ENCOUNTER — Emergency Department (HOSPITAL_COMMUNITY): Payer: 59

## 2019-12-05 LAB — CBC
HCT: 35.7 % — ABNORMAL LOW (ref 36.0–46.0)
Hemoglobin: 10.9 g/dL — ABNORMAL LOW (ref 12.0–15.0)
MCH: 26.1 pg (ref 26.0–34.0)
MCHC: 30.5 g/dL (ref 30.0–36.0)
MCV: 85.4 fL (ref 80.0–100.0)
Platelets: 320 10*3/uL (ref 150–400)
RBC: 4.18 MIL/uL (ref 3.87–5.11)
RDW: 14.6 % (ref 11.5–15.5)
WBC: 10.5 10*3/uL (ref 4.0–10.5)
nRBC: 0 % (ref 0.0–0.2)

## 2019-12-05 LAB — LIPASE, BLOOD: Lipase: 29 U/L (ref 11–51)

## 2019-12-05 LAB — BASIC METABOLIC PANEL
Anion gap: 11 (ref 5–15)
BUN: 13 mg/dL (ref 6–20)
CO2: 25 mmol/L (ref 22–32)
Calcium: 8.8 mg/dL — ABNORMAL LOW (ref 8.9–10.3)
Chloride: 103 mmol/L (ref 98–111)
Creatinine, Ser: 0.92 mg/dL (ref 0.44–1.00)
GFR calc Af Amer: >60 mL/min (ref >60)
GFR calc non Af Amer: >60 mL/min (ref >60)
Glucose, Bld: 177 mg/dL — ABNORMAL HIGH (ref 70–99)
Potassium: 3.6 mmol/L (ref 3.5–5.1)
Sodium: 139 mmol/L (ref 135–145)

## 2019-12-05 LAB — I-STAT BETA HCG BLOOD, ED (MC, WL, AP ONLY): I-stat hCG, quantitative: <5 m[IU]/mL (ref <5)

## 2019-12-05 LAB — TROPONIN I (HIGH SENSITIVITY): Troponin I (High Sensitivity): 3 ng/L (ref <18)

## 2019-12-05 NOTE — ED Notes (Signed)
Pt states her ride is outside and she is leaving, says she does not want to wait and will talk to her own doctor. IV the right AC removed prior to patient leaving. Encouraged pt to stay and that she can return at any time if she chooses

## 2019-12-05 NOTE — ED Notes (Signed)
Pt left waiting room with daughter

## 2020-07-16 ENCOUNTER — Encounter (HOSPITAL_COMMUNITY): Payer: Self-pay | Admitting: *Deleted

## 2020-07-16 ENCOUNTER — Emergency Department (HOSPITAL_COMMUNITY): Payer: Worker's Compensation

## 2020-07-16 ENCOUNTER — Other Ambulatory Visit: Payer: Self-pay

## 2020-07-16 ENCOUNTER — Emergency Department (HOSPITAL_COMMUNITY)
Admission: EM | Admit: 2020-07-16 | Discharge: 2020-07-16 | Disposition: A | Discharge status: Home / Self Care | Payer: Worker's Compensation | Attending: Emergency Medicine | Admitting: Emergency Medicine

## 2020-07-16 DIAGNOSIS — Y9301 Activity, walking, marching and hiking: Secondary | ICD-10-CM | POA: Diagnosis not present

## 2020-07-16 DIAGNOSIS — I509 Heart failure, unspecified: Secondary | ICD-10-CM | POA: Insufficient documentation

## 2020-07-16 DIAGNOSIS — S99921D Unspecified injury of right foot, subsequent encounter: Secondary | ICD-10-CM

## 2020-07-16 DIAGNOSIS — W01198A Fall on same level from slipping, tripping and stumbling with subsequent striking against other object, initial encounter: Secondary | ICD-10-CM | POA: Diagnosis not present

## 2020-07-16 DIAGNOSIS — W19XXXD Unspecified fall, subsequent encounter: Secondary | ICD-10-CM

## 2020-07-16 DIAGNOSIS — S99921A Unspecified injury of right foot, initial encounter: Secondary | ICD-10-CM | POA: Diagnosis not present

## 2020-07-16 DIAGNOSIS — E119 Type 2 diabetes mellitus without complications: Secondary | ICD-10-CM | POA: Insufficient documentation

## 2020-07-16 DIAGNOSIS — J45909 Unspecified asthma, uncomplicated: Secondary | ICD-10-CM | POA: Diagnosis not present

## 2020-07-16 DIAGNOSIS — S80211A Abrasion, right knee, initial encounter: Secondary | ICD-10-CM | POA: Insufficient documentation

## 2020-07-16 DIAGNOSIS — Z79899 Other long term (current) drug therapy: Secondary | ICD-10-CM | POA: Insufficient documentation

## 2020-07-16 DIAGNOSIS — Y990 Civilian activity done for income or pay: Secondary | ICD-10-CM | POA: Insufficient documentation

## 2020-07-16 DIAGNOSIS — Z7984 Long term (current) use of oral hypoglycemic drugs: Secondary | ICD-10-CM | POA: Insufficient documentation

## 2020-07-16 DIAGNOSIS — S8991XD Unspecified injury of right lower leg, subsequent encounter: Secondary | ICD-10-CM

## 2020-07-16 DIAGNOSIS — I110 Hypertensive heart disease with heart failure: Secondary | ICD-10-CM | POA: Insufficient documentation

## 2020-07-16 DIAGNOSIS — S8991XA Unspecified injury of right lower leg, initial encounter: Secondary | ICD-10-CM | POA: Diagnosis present

## 2020-07-16 MED ORDER — DICLOFENAC SODIUM 1 % EX GEL: 4.0000 g | Freq: Four times a day (QID) | CUTANEOUS | 2 refills | Status: DC

## 2020-07-16 MED ORDER — METHOCARBAMOL 750 MG PO TABS
750.0000 mg | ORAL_TABLET | Freq: Two times a day (BID) | ORAL | 0 refills | Status: DC | PRN
Start: 2020-07-16 — End: 2021-07-05

## 2020-07-16 NOTE — Progress Notes (Signed)
Orthopedic Tech Progress Note Patient Details:  Courtney Clayton 01/08/1971 [id number]  Ortho Devices Type of Ortho Device: Ace wrap,Crutches Ortho Device/Splint Location: RLE Ortho Device/Splint Interventions: Ordered,Application   Post Interventions Patient Tolerated: Well Instructions Provided: Care of device   [name] 07/16/2020, 4:25 PM

## 2020-07-16 NOTE — ED Provider Notes (Signed)
[facility]-EMERGENCY DEPT Provider Note   CSN: [id number] Arrival date & time: 07/16/20  1412     History Chief Complaint  Patient presents with  . Fall    Rt toe, foot, head pain    Courtney Clayton is a 50 y.o. female.  HPI      Courtney Clayton is a 50 y.o. female, with a history of asthma, CHF, DM, HTN, obesity, presenting to the ED with injuries from a fall that occurred yesterday around 3 PM. States she walked outside, stepped into a hole accidentally with her left foot, and fell forward onto the ground. She struck her forehead and injured her right knee and right big toe. She was evaluated in the ED at [facility] yesterday, however, she did not stay for her imaging results. She states she did have a couple episodes of vomiting yesterday, but none since yesterday evening. She denies anticoagulation. Denies LOC, further nausea, numbness, weakness, chest pain, shortness of breath, abdominal pain, dizziness, or any other complaints.  Past Medical History:  Diagnosis Date  . Asthma   . CHF (congestive heart failure) (HCC)   . Depression   . Diabetes mellitus without complication (HCC)   . Hypertension   . MI, old     There are no problems to display for this patient.   Past Surgical History:  Procedure Laterality Date  . ABDOMINAL HYSTERECTOMY    . BACK SURGERY    . GANGLION CYST EXCISION    . HERNIA REPAIR    . TONSILLECTOMY       OB History   No obstetric history on file.     No family history on file.  Social History   Tobacco Use  . Smoking status: Never Smoker  . Smokeless tobacco: Never Used  Vaping Use  . Vaping Use: Never used  Substance Use Topics  . Alcohol use: Yes    Comment: 1 glass of wine "here and there"  . Drug use: Never    Home Medications Prior to Admission medications   Medication Sig Start Date End Date Taking? Authorizing Provider  diclofenac Sodium (VOLTAREN) 1 % GEL Apply 4 g  topically 4 (four) times daily. 07/16/20  Yes Darrie Macmillan C, PA-C  methocarbamol (ROBAXIN) 750 MG tablet Take 1 tablet (750 mg total) by mouth 2 (two) times daily as needed for muscle spasms (muscle tightness/stiffness). 07/16/20  Yes Jennise Both C, PA-C  buPROPion (WELLBUTRIN SR) 100 MG 12 hr tablet Take 100 mg by mouth 2 (two) times daily. 03/20/19   [provider]  Butalbital-APAP-Caffeine 50-300-40 MG CAPS Take 1 capsule by mouth every 4 (four) hours as needed (migraine).  02/17/19   [provider]  cetirizine (ZYRTEC ALLERGY) 10 MG tablet Take 1 tablet (10 mg total) by mouth 2 (two) times daily for 5 days. 04/01/19 04/15/19  Muthersbaugh, Dahlia Client, PA-C  famotidine (PEPCID) 20 MG tablet Take 1 tablet (20 mg total) by mouth 2 (two) times daily. Patient not taking: Reported on 07/10/2019 04/01/19   Muthersbaugh, Dahlia Client, PA-C  hydrocortisone 2.5 % lotion Apply topically 2 (two) times daily. Patient not taking: Reported on 04/15/2019 04/01/19   Muthersbaugh, Dahlia Client, PA-C  losartan (COZAAR) 50 MG tablet Take 50 mg by mouth daily. 02/11/19   [provider]  meloxicam (MOBIC) 15 MG tablet Take 15 mg by mouth daily as needed for pain.  02/11/19   [provider]  metFORMIN (GLUCOPHAGE) 500 MG tablet Take 500 mg  by mouth at bedtime. 03/20/19   [provider]  Metoprolol Tartrate 75 MG TABS Take 75 tablets by mouth 2 (two) times daily. 03/15/19   [provider]  OZEMPIC, 1 MG/DOSE, 2 MG/1.5ML SOPN Inject 1 mg into the skin once a week. 02/17/19   [provider]  potassium chloride SA (KLOR-CON) 20 MEQ tablet Take 20 mEq by mouth 2 (two) times daily. 03/21/19   [provider]  predniSONE (DELTASONE) 20 MG tablet Take 2 tablets (40 mg total) by mouth daily. Patient not taking: Reported on 04/15/2019 04/01/19   Muthersbaugh, Dahlia Client, PA-C  simvastatin (ZOCOR) 20 MG tablet Take 20 mg by mouth daily. 02/11/19   [provider]  temazepam  (RESTORIL) 30 MG capsule Take 30 mg by mouth at bedtime as needed for sleep.  02/11/19   [provider]    Allergies    Sulfa antibiotics  Review of Systems   Review of Systems  Constitutional: Negative for diaphoresis.  Respiratory: Negative for shortness of breath.   Cardiovascular: Negative for chest pain.  Gastrointestinal: Positive for nausea (resolved) and vomiting (resolved). Negative for abdominal pain.  Musculoskeletal: Positive for arthralgias and neck pain. Negative for back pain.  Neurological: Negative for dizziness, syncope, weakness, numbness and headaches.  All other systems reviewed and are negative.   Physical Exam Updated Vital Signs BP (!) 165/129 (BP Location: Left Arm)   Pulse (!) 101   Temp 98.1 F (36.7 C) (Oral)   Resp 18   SpO2 98%   Physical Exam Vitals and nursing note reviewed.  Constitutional:      General: She is not in acute distress.    Appearance: She is well-developed. She is not diaphoretic.  HENT:     Head: Normocephalic and atraumatic.     Mouth/Throat:     Mouth: Mucous membranes are moist.     Pharynx: Oropharynx is clear.  Eyes:     Extraocular Movements: Extraocular movements intact.     Conjunctiva/sclera: Conjunctivae normal.     Pupils: Pupils are equal, round, and reactive to light.  Cardiovascular:     Rate and Rhythm: Normal rate and regular rhythm.     Pulses: Normal pulses.          Posterior tibial pulses are 2+ on the right side and 2+ on the left side.     Heart sounds: Normal heart sounds.  Pulmonary:     Effort: Pulmonary effort is normal. No respiratory distress.     Breath sounds: Normal breath sounds.  Abdominal:     Palpations: Abdomen is soft.     Tenderness: There is no abdominal tenderness. There is no guarding.  Musculoskeletal:     Cervical back: Normal range of motion and neck supple.     Comments: Tenderness to the right cervical musculature into the right trapezius without swelling,  deformity, instability. Normal motor function intact in all extremities. No midline spinal tenderness.   Mild abrasion to the right anterior knee.  Tenderness to the right anterior and lateral knee.  No noted swelling, deformity, instability/laxity.  Range of motion intact, though painful. No pain, tenderness, other evidence of injury to the right upper or right lower leg. Tenderness and pain along with some swelling big toe extending to the region of the first metatarsal.  No noted deformity or instability.  Skin:    General: Skin is warm and dry.  Neurological:     Mental Status: She is alert and oriented to person,  place, and time.     Comments: No noted acute cognitive deficit. Sensation grossly intact to light touch in the extremities.   Grip strengths equal bilaterally.   Strength 5/5 in all extremities.  Coordination intact.  Cranial nerves III-XII grossly intact.  Handles oral secretions without noted difficulty.  No noted phonation or speech deficit. No facial droop.   Psychiatric:        Mood and Affect: Mood and affect normal.        Speech: Speech normal.        Behavior: Behavior normal.     ED Results / Procedures / Treatments   Labs (all labs ordered are listed, but only abnormal results are displayed) Labs Reviewed - No data to display  EKG None  Radiology DG Foot Complete Right  Result Date: 07/16/2020 CLINICAL DATA:  Foot pain. EXAM: RIGHT FOOT COMPLETE - 3+ VIEW COMPARISON:  None. FINDINGS: There are degenerative changes of the first metatarsophalangeal joint. There is no definite acute displaced fracture. No dislocation. There is a large Achilles tendon enthesophyte. There is a large plantar calcaneal spur. There are degenerative changes of the midfoot. There is a calcification at the level of the cuboid which may related to an old remote injury. IMPRESSION: 1. No acute osseous abnormality. 2. Degenerative changes as above. 3. Large Achilles tendon enthesophyte.  4. Large plantar calcaneal spur. Electronically Signed   By: Katherine Mantle M.D.   On: 07/16/2020 15:42    Procedures Procedures   Medications Ordered in ED Medications - No data to display  ED Course  I have reviewed the triage vital signs and the nursing notes.  Pertinent labs & imaging results that were available during my care of the patient were reviewed by me and considered in my medical decision making (see chart for details).    MDM Rules/Calculators/A&P                          Patient presents with injuries following a fall that occurred yesterday. I reviewed the impressions from her head CT, cervical spine CT, and x-ray of the right knee performed at outside facility.  No acute abnormalities noted on these imaging studies. No focal neurologic deficits.  Patient has not had continued vomiting and has had no nausea today. No acute abnormalities on foot x-ray today. Recommend orthopedic follow-up. The patient was given instructions for home care as well as return precautions. Patient voices understanding of these instructions, accepts the plan, and is comfortable with discharge.    Final Clinical Impression(s) / ED Diagnoses Final diagnoses:  Fall, subsequent encounter  Injury of right knee, subsequent encounter  Injury of toe on right foot, subsequent encounter    Rx / DC Orders ED Discharge Orders         Ordered    diclofenac Sodium (VOLTAREN) 1 % GEL  4 times daily        07/16/20 1550    methocarbamol (ROBAXIN) 750 MG tablet  2 times daily PRN        07/16/20 1550           Anselm Pancoast, PA-C 07/16/20 1638    Gwyneth Sprout, MD 07/16/20 2121

## 2020-07-16 NOTE — Discharge Instructions (Addendum)
You have been seen today for a foot injury. There were no acute abnormalities on the x-rays, including no sign of fracture or dislocation, however, there could be injuries to the soft tissues, such as the ligaments or tendons that are not seen on xrays. There could also be what are called occult fractures that are small fractures not seen on xray. Acetaminophen: May take acetaminophen (generic for Tylenol), as needed, for pain. Your daily total maximum amount of acetaminophen from all sources should be limited to 4000mg /day for persons without liver problems, or 2000mg /day for those with liver problems. Diclofenac gel: This is a topical anti-inflammatory medication and can be applied directly to the painful region.  Do not use on the face or genitals.  This medication may be used as an alternative to oral anti-inflammatory medications, such as ibuprofen or naproxen. Methocarbamol: Methocarbamol (generic for Robaxin) is a muscle relaxer and can help relieve stiff muscles or muscle spasms.  Do not drive or perform other dangerous activities while taking this medication as it can cause drowsiness as well as changes in reaction time and judgement. Ice: May apply ice to the area over the next 24 hours for 15 minutes at a time to reduce swelling. Elevation: Keep the extremity elevated as often as possible to reduce pain and inflammation. Support: Wear the ACE wrap for support and comfort. Wear this until pain resolves. You will be weight-bearing as tolerated, which means you can slowly start to put weight on the extremity and increase amount and frequency as pain allows. Exercises: Start by performing these exercises a few times a week, increasing the frequency until you are performing them twice daily.  Follow up: If symptoms are improving, you may follow up with your primary care provider for any continued management. If symptoms are not starting to improve within a week, you should follow up with the orthopedic  specialist within two weeks. Return: Return to the ED for numbness, weakness, increasing pain, overall worsening symptoms, loss of function, or if symptoms are not improving, you have tried to follow up with the orthopedic specialist, and have been unable to do so.  For prescription assistance, may try using prescription discount sites or apps, such as goodrx.com or Good Rx smart phone app.   Head Injury You have been seen today for a head injury, which may or may not have resulted in a concussion. It does not appear to be serious at this time, however, it is important to note that your presentation today is not necessarily an indication of the severity of future symptoms.  Expected symptoms: Expected symptoms of concussion and/or head injury can include nausea, headache, mild dizziness (should still be able to get up and walk around without difficulty), difficulty concentrating, increased sleep, difficulty sleeping, increased intensity of emotions. Close observation: The close observation period is usually 6 hours from the injury. This includes staying awake and having a trustworthy adult monitor you to assure your condition does not worsen. You should be in regular contact with this person and ideally, they should be able to monitor you in person.  Secondary observation: The secondary observation period is usually 24 hours from the injury. You are allowed to sleep during this time. A trustworthy adult should intermittently monitor you to assure your condition does not worsen.   Overall head injury/concussion care: Rest: Be sure to get plenty of rest. You will need more rest and sleep while you recover. Hydration: Be sure to stay well hydrated by having a  goal of drinking about 0.5 liters of water an hour. Pain:  Acetaminophen: May take acetaminophen (generic for Tylenol), as needed, for pain. Your daily total maximum amount of acetaminophen from all sources should be limited to 4000mg /day for  persons without liver problems, or 2000mg /day for those with liver problems. Return to sports and activities: In general, you may return to normal activities once symptoms have subsided, however, you would ideally be cleared by a primary care provider or other qualified medical professional prior to return to these activities.  Follow up: Follow up with the concussion clinic or your primary care provider for further management of this issue. Return: Return to the ED should you begin to have confusion, abnormal behavior, aggression, violence, or personality changes, repeated vomiting, vision loss, numbness or weakness on one side of the body, difficulty standing due to dizziness, significantly worsening pain, or any other major concerns.

## 2020-07-16 NOTE — ED Triage Notes (Addendum)
Pt fell at work yesterday, complains of right big toe, right knee, and head pain. She had xrays and head CT at a hospital in [facility] but did not wait for results.

## 2020-11-02 ENCOUNTER — Encounter (HOSPITAL_COMMUNITY): Payer: Self-pay

## 2020-11-02 ENCOUNTER — Other Ambulatory Visit: Payer: Self-pay

## 2020-11-02 ENCOUNTER — Emergency Department (HOSPITAL_COMMUNITY)
Admission: EM | Admit: 2020-11-02 | Discharge: 2020-11-02 | Disposition: A | Discharge status: Home / Self Care | Payer: 59 | Attending: Emergency Medicine | Admitting: Emergency Medicine

## 2020-11-02 DIAGNOSIS — I509 Heart failure, unspecified: Secondary | ICD-10-CM | POA: Insufficient documentation

## 2020-11-02 DIAGNOSIS — J45909 Unspecified asthma, uncomplicated: Secondary | ICD-10-CM | POA: Insufficient documentation

## 2020-11-02 DIAGNOSIS — G43001 Migraine without aura, not intractable, with status migrainosus: Secondary | ICD-10-CM

## 2020-11-02 DIAGNOSIS — E119 Type 2 diabetes mellitus without complications: Secondary | ICD-10-CM | POA: Insufficient documentation

## 2020-11-02 DIAGNOSIS — Z79899 Other long term (current) drug therapy: Secondary | ICD-10-CM | POA: Insufficient documentation

## 2020-11-02 DIAGNOSIS — I110 Hypertensive heart disease with heart failure: Secondary | ICD-10-CM | POA: Insufficient documentation

## 2020-11-02 DIAGNOSIS — Z7984 Long term (current) use of oral hypoglycemic drugs: Secondary | ICD-10-CM | POA: Insufficient documentation

## 2020-11-02 MED ORDER — DIPHENHYDRAMINE HCL 50 MG/ML IJ SOLN
50.0000 mg | Freq: Once | INTRAMUSCULAR | Status: AC
  Administered 2020-11-02: 50 mg via INTRAVENOUS
  Filled 2020-11-02: qty 1

## 2020-11-02 MED ORDER — KETOROLAC TROMETHAMINE 30 MG/ML IJ SOLN
30.0000 mg | Freq: Once | INTRAMUSCULAR | Status: AC
  Administered 2020-11-02: 30 mg via INTRAVENOUS
  Filled 2020-11-02: qty 1

## 2020-11-02 MED ORDER — OXYCODONE-ACETAMINOPHEN 5-325 MG PO TABS: 1.0000 | ORAL_TABLET | Freq: Four times a day (QID) | ORAL | 0 refills | Status: DC | PRN

## 2020-11-02 MED ORDER — METOCLOPRAMIDE HCL 5 MG/ML IJ SOLN
10.0000 mg | Freq: Once | INTRAMUSCULAR | Status: AC
  Administered 2020-11-02: 10 mg via INTRAVENOUS
  Filled 2020-11-02: qty 2

## 2020-11-02 NOTE — ED Triage Notes (Signed)
Patient c/o migraine,headaches x 2 days. Patient also c/o blurred vision, N/V and states she is light sensitive.

## 2020-11-02 NOTE — ED Provider Notes (Signed)
[facility]-EMERGENCY DEPT Provider Note   CSN: [id number] Arrival date & time: 11/02/20  1734     History Chief Complaint  Patient presents with   Migraine    Courtney Clayton is a 50 y.o. female.  Patient has a history of migraines.  She complains of a severe migraine is not getting better with her medicines.  The history is provided by the patient and medical records. No language interpreter was used.  Migraine This is a new problem. The current episode started 12 to 24 hours ago. The problem occurs constantly. The problem has not changed since onset.Associated symptoms include headaches. Pertinent negatives include no chest pain and no abdominal pain. Nothing aggravates the symptoms. The treatment provided no relief.      Past Medical History:  Diagnosis Date   Asthma    CHF (congestive heart failure) (HCC)    Depression    Diabetes mellitus without complication (HCC)    Hypertension    MI, old     There are no problems to display for this patient.   Past Surgical History:  Procedure Laterality Date   ABDOMINAL HYSTERECTOMY     BACK SURGERY     GANGLION CYST EXCISION     HERNIA REPAIR     TONSILLECTOMY       OB History   No obstetric history on file.     History reviewed. No pertinent family history.  Social History   Tobacco Use   Smoking status: Never   Smokeless tobacco: Never  Vaping Use   Vaping Use: Never used  Substance Use Topics   Alcohol use: Yes    Comment: 1 glass of wine "here and there"   Drug use: Never    Home Medications Prior to Admission medications   Medication Sig Start Date End Date Taking? Authorizing Provider  oxyCODONE-acetaminophen (PERCOCET) 5-325 MG tablet Take 1 tablet by mouth every 6 (six) hours as needed. 11/02/20  Yes Bethann Berkshire, MD  buPROPion [facility] SR) 100 MG 12 hr tablet Take 100 mg by mouth 2 (two) times daily. 03/20/19   [provider]  Butalbital-APAP-Caffeine  50-300-40 MG CAPS Take 1 capsule by mouth every 4 (four) hours as needed (migraine).  02/17/19   [provider]  cetirizine (ZYRTEC ALLERGY) 10 MG tablet Take 1 tablet (10 mg total) by mouth 2 (two) times daily for 5 days. 04/01/19 04/15/19  Muthersbaugh, Dahlia Client, PA-C  diclofenac Sodium (VOLTAREN) 1 % GEL Apply 4 g topically 4 (four) times daily. 07/16/20   Joy, Shawn C, PA-C  famotidine (PEPCID) 20 MG tablet Take 1 tablet (20 mg total) by mouth 2 (two) times daily. Patient not taking: Reported on 07/10/2019 04/01/19   Muthersbaugh, Dahlia Client, PA-C  hydrocortisone 2.5 % lotion Apply topically 2 (two) times daily. Patient not taking: Reported on 04/15/2019 04/01/19   Muthersbaugh, Dahlia Client, PA-C  losartan (COZAAR) 50 MG tablet Take 50 mg by mouth daily. 02/11/19   [provider]  meloxicam (MOBIC) 15 MG tablet Take 15 mg by mouth daily as needed for pain.  02/11/19   [provider]  metFORMIN (GLUCOPHAGE) 500 MG tablet Take 500 mg by mouth at bedtime. 03/20/19   [provider]  methocarbamol (ROBAXIN) 750 MG tablet Take 1 tablet (750 mg total) by mouth 2 (two) times daily as needed for muscle spasms (muscle tightness/stiffness). 07/16/20   Joy, Shawn C, PA-C  Metoprolol Tartrate 75 MG TABS Take 75 tablets by mouth 2 (two) times daily.  03/15/19   [provider]  OZEMPIC, 1 MG/DOSE, 2 MG/1.5ML SOPN Inject 1 mg into the skin once a week. 02/17/19   [provider]  potassium chloride SA (KLOR-CON) 20 MEQ tablet Take 20 mEq by mouth 2 (two) times daily. 03/21/19   [provider]  predniSONE (DELTASONE) 20 MG tablet Take 2 tablets (40 mg total) by mouth daily. Patient not taking: Reported on 04/15/2019 04/01/19   Muthersbaugh, Dahlia Client, PA-C  simvastatin (ZOCOR) 20 MG tablet Take 20 mg by mouth daily. 02/11/19   [provider]  temazepam (RESTORIL) 30 MG capsule Take 30 mg by mouth at bedtime as needed for sleep.  02/11/19   [provider]    Allergies    Sulfa antibiotics  Review of Systems   Review of Systems  Constitutional:  Negative for appetite change and fatigue.  HENT:  Negative for congestion, ear discharge and sinus pressure.   Eyes:  Negative for discharge.  Respiratory:  Negative for cough.   Cardiovascular:  Negative for chest pain.  Gastrointestinal:  Negative for abdominal pain and diarrhea.  Genitourinary:  Negative for frequency and hematuria.  Musculoskeletal:  Negative for back pain.  Skin:  Negative for rash.  Neurological:  Positive for headaches. Negative for seizures.  Psychiatric/Behavioral:  Negative for hallucinations.    Physical Exam Updated Vital Signs BP (!) 164/94   Pulse 73   Temp 98.3 F (36.8 C) (Oral)   Resp 16   Ht 5\' 5"  (1.651 m)   Wt (!) 176.4 kg   SpO2 100%   BMI 64.73 kg/m   Physical Exam Vitals and nursing note reviewed.  Constitutional:      Appearance: She is well-developed.  HENT:     Head: Normocephalic.     Right Ear: Tympanic membrane normal.     Nose: Nose normal.  Eyes:     General: No scleral icterus.    Conjunctiva/sclera: Conjunctivae normal.  Neck:     Thyroid: No thyromegaly.  Cardiovascular:     Rate and Rhythm: Normal rate and regular rhythm.     Heart sounds: No murmur heard.   No friction rub. No gallop.  Pulmonary:     Breath sounds: No stridor. No wheezing or rales.  Chest:     Chest wall: No tenderness.  Abdominal:     General: There is no distension.     Tenderness: There is no abdominal tenderness. There is no rebound.  Musculoskeletal:        General: Normal range of motion.     Cervical back: Neck supple.  Lymphadenopathy:     Cervical: No cervical adenopathy.  Skin:    Findings: No erythema or rash.  Neurological:     General: No focal deficit present.     Mental Status: She is alert and oriented to person, place, and time.     Motor: No abnormal muscle tone.     Coordination: Coordination normal.  Psychiatric:         Mood and Affect: Mood normal.        Behavior: Behavior normal.    ED Results / Procedures / Treatments   Labs (all labs ordered are listed, but only abnormal results are displayed) Labs Reviewed - No data to display  EKG None  Radiology No results found.  Procedures Procedures   Medications Ordered in ED Medications  ketorolac (TORADOL) 30 MG/ML injection 30 mg (30 mg Intravenous Given 11/02/20 1925)  metoCLOPramide (REGLAN) injection 10 mg (  10 mg Intravenous Given 11/02/20 1925)  diphenhydrAMINE (BENADRYL) injection 50 mg (50 mg Intravenous Given 11/02/20 1925)    ED Course  I have reviewed the triage vital signs and the nursing notes.  Pertinent labs & imaging results that were available during my care of the patient were reviewed by me and considered in my medical decision making (see chart for details).    MDM Rules/Calculators/A&P                          Patient with migraine headache.  Patient improved with treatment.  She will follow-up with her doctor this week.  Patient also has some hypertension and she needs to follow-up with her PCP Final Clinical Impression(s) / ED Diagnoses Final diagnoses:  Migraine without aura and with status migrainosus, not intractable    Rx / DC Orders ED Discharge Orders          Ordered    oxyCODONE-acetaminophen (PERCOCET) 5-325 MG tablet  Every 6 hours PRN        11/02/20 2013             Bethann Berkshire, MD 11/02/20 2016

## 2020-11-02 NOTE — Discharge Instructions (Addendum)
Follow-up with your family doctor this week to check your blood pressure and see how you are doing

## 2020-11-18 IMAGING — DX DG CHEST 1V PORT
1 series · 1 of 1 positions shown · non-contrast
Comparison: 09/09/2018

CLINICAL DATA: Headache, fever

EXAM:
PORTABLE CHEST 1 VIEW

[chest ap]
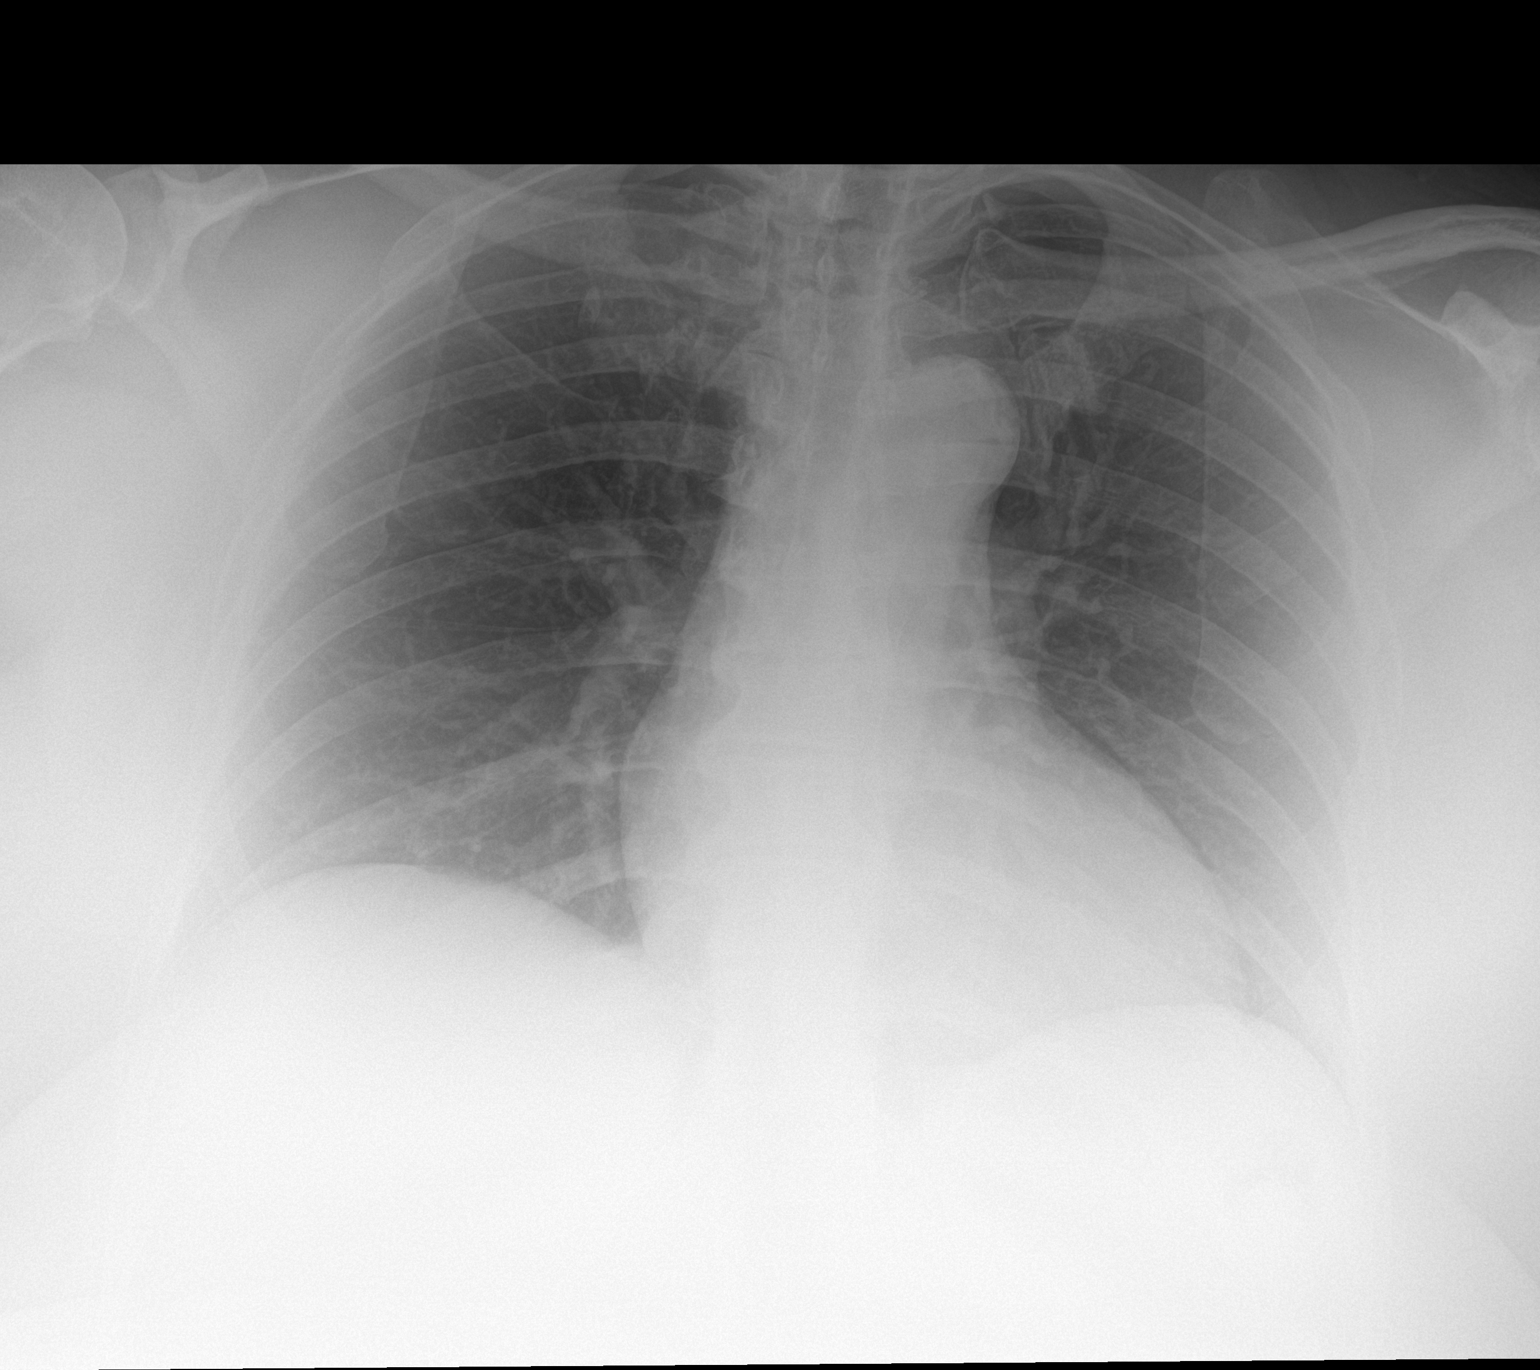

[1 of 1 positions shown; findings below may reference images not displayed]

FINDINGS: 1 Normal mediastinum and cardiac silhouette. Normal pulmonary
vasculature. No evidence of effusion, infiltrate, or pneumothorax.
No acute bony abnormality.
IMPRESSION: No acute cardiopulmonary process.

## 2021-03-02 ENCOUNTER — Encounter (HOSPITAL_COMMUNITY): Payer: Self-pay | Admitting: Radiology

## 2021-03-02 ENCOUNTER — Other Ambulatory Visit: Payer: Self-pay

## 2021-03-02 ENCOUNTER — Emergency Department (HOSPITAL_COMMUNITY): Payer: Self-pay

## 2021-03-02 ENCOUNTER — Emergency Department (HOSPITAL_COMMUNITY)
Admission: EM | Admit: 2021-03-02 | Discharge: 2021-03-03 | Discharge status: Against Medical Advice | Payer: Self-pay | Attending: Student | Admitting: Student

## 2021-03-02 DIAGNOSIS — Z5321 Procedure and treatment not carried out due to patient leaving prior to being seen by health care provider: Secondary | ICD-10-CM | POA: Insufficient documentation

## 2021-03-02 DIAGNOSIS — R079 Chest pain, unspecified: Secondary | ICD-10-CM

## 2021-03-02 DIAGNOSIS — R42 Dizziness and giddiness: Secondary | ICD-10-CM | POA: Insufficient documentation

## 2021-03-02 DIAGNOSIS — H538 Other visual disturbances: Secondary | ICD-10-CM | POA: Insufficient documentation

## 2021-03-02 DIAGNOSIS — R519 Headache, unspecified: Secondary | ICD-10-CM | POA: Insufficient documentation

## 2021-03-02 LAB — CBC WITH DIFFERENTIAL/PLATELET
Abs Immature Granulocytes: 0.02 10*3/uL (ref 0.00–0.07)
Basophils Absolute: 0 10*3/uL (ref 0.0–0.1)
Basophils Relative: 0 %
Eosinophils Absolute: 0.1 10*3/uL (ref 0.0–0.5)
Eosinophils Relative: 1 %
HCT: 39.3 % (ref 36.0–46.0)
Hemoglobin: 12.7 g/dL (ref 12.0–15.0)
Immature Granulocytes: 0 %
Lymphocytes Relative: 50 %
Lymphs Abs: 5 10*3/uL — ABNORMAL HIGH (ref 0.7–4.0)
MCH: 27 pg (ref 26.0–34.0)
MCHC: 32.3 g/dL (ref 30.0–36.0)
MCV: 83.4 fL (ref 80.0–100.0)
Monocytes Absolute: 0.8 10*3/uL (ref 0.1–1.0)
Monocytes Relative: 8 %
Neutro Abs: 4 10*3/uL (ref 1.7–7.7)
Neutrophils Relative %: 41 %
Platelets: 287 10*3/uL (ref 150–400)
RBC: 4.71 MIL/uL (ref 3.87–5.11)
RDW: 15.1 % (ref 11.5–15.5)
WBC: 9.9 10*3/uL (ref 4.0–10.5)
nRBC: 0 % (ref 0.0–0.2)

## 2021-03-02 LAB — BASIC METABOLIC PANEL
Anion gap: 7 (ref 5–15)
BUN: 13 mg/dL (ref 6–20)
CO2: 26 mmol/L (ref 22–32)
Calcium: 8.7 mg/dL — ABNORMAL LOW (ref 8.9–10.3)
Chloride: 104 mmol/L (ref 98–111)
Creatinine, Ser: 0.81 mg/dL (ref 0.44–1.00)
GFR, Estimated: >60 mL/min (ref >60)
Glucose, Bld: 99 mg/dL (ref 70–99)
Potassium: 3.3 mmol/L — ABNORMAL LOW (ref 3.5–5.1)
Sodium: 137 mmol/L (ref 135–145)

## 2021-03-02 LAB — TROPONIN I (HIGH SENSITIVITY): Troponin I (High Sensitivity): <2 ng/L (ref <18)

## 2021-03-02 NOTE — ED Provider Notes (Signed)
Emergency Medicine Provider Triage Evaluation Note  Courtney Clayton , a 50 y.o. female  was evaluated in triage.  Pt complains of intermittent headaches, dizziness, blurry vision, and intermittent chest pain that has been ongoing for the past few days.  Patient is concerned about her high blood pressure.  She had a recent medication changes.  Blood pressure today was 233/113.  Patient also endorses right knee pain x3 to 4 days.  Pain associated with edema.  No known injury.  No fever or chills.  Pain worse with range of motion.  Review of Systems  Positive: Headache, CP, blurry vision Negative: fever  Physical Exam  BP (!) 198/134 (BP Location: Left Arm)   Pulse 66   Temp 97.8 F (36.6 C) (Oral)   Resp 20   Ht 5\' 5"  (1.651 m)   Wt 127 kg   SpO2 98%   BMI 46.59 kg/m  Gen:   Awake, no distress   Resp:  Normal effort  MSK:   Moves extremities without difficulty  Other:    Medical Decision Making  Medically screening exam initiated at 6:43 PM.  Appropriate orders placed.  was informed that the remainder of the evaluation will be completed by another provider, this initial triage assessment does not replace that evaluation, and the importance of remaining in the ED until their evaluation is complete.  Labs, CT head, troponin to rule out end organ damage Right knee x-ray   [name] 03/02/21 03/04/21    Carlis Stable, MD 03/02/21 2032

## 2021-03-02 NOTE — ED Triage Notes (Signed)
Pt states she has been dealing with right knee pain for several days now. Pt states that a week ago she noticed her BP starting to go up. Pt states she had a recent BP med change and now her BP is so high that she is having headaches. Pt highest BP today was 233/113. In triage she is 198/134.

## 2021-04-17 ENCOUNTER — Emergency Department (HOSPITAL_COMMUNITY)
Admission: EM | Admit: 2021-04-17 | Discharge: 2021-04-17 | Disposition: A | Discharge status: Home / Self Care | Payer: Self-pay | Attending: Emergency Medicine | Admitting: Emergency Medicine

## 2021-04-17 ENCOUNTER — Other Ambulatory Visit: Payer: Self-pay

## 2021-04-17 ENCOUNTER — Encounter (HOSPITAL_COMMUNITY): Payer: Self-pay

## 2021-04-17 DIAGNOSIS — Z7984 Long term (current) use of oral hypoglycemic drugs: Secondary | ICD-10-CM | POA: Insufficient documentation

## 2021-04-17 DIAGNOSIS — I10 Essential (primary) hypertension: Secondary | ICD-10-CM

## 2021-04-17 DIAGNOSIS — Z760 Encounter for issue of repeat prescription: Secondary | ICD-10-CM | POA: Insufficient documentation

## 2021-04-17 DIAGNOSIS — I509 Heart failure, unspecified: Secondary | ICD-10-CM | POA: Insufficient documentation

## 2021-04-17 DIAGNOSIS — Z79899 Other long term (current) drug therapy: Secondary | ICD-10-CM | POA: Insufficient documentation

## 2021-04-17 DIAGNOSIS — Z20822 Contact with and (suspected) exposure to covid-19: Secondary | ICD-10-CM | POA: Insufficient documentation

## 2021-04-17 DIAGNOSIS — J0110 Acute frontal sinusitis, unspecified: Secondary | ICD-10-CM | POA: Insufficient documentation

## 2021-04-17 DIAGNOSIS — E119 Type 2 diabetes mellitus without complications: Secondary | ICD-10-CM | POA: Insufficient documentation

## 2021-04-17 DIAGNOSIS — J45909 Unspecified asthma, uncomplicated: Secondary | ICD-10-CM | POA: Insufficient documentation

## 2021-04-17 DIAGNOSIS — I110 Hypertensive heart disease with heart failure: Secondary | ICD-10-CM | POA: Insufficient documentation

## 2021-04-17 LAB — RESP PANEL BY RT-PCR (FLU A&B, COVID) ARPGX2
Influenza A by PCR: NEGATIVE
Influenza B by PCR: NEGATIVE
SARS Coronavirus 2 by RT PCR: NEGATIVE

## 2021-04-17 MED ORDER — CLONIDINE HCL 0.1 MG PO TABS: 0.1000 mg | ORAL_TABLET | Freq: Every day | ORAL | 0 refills | Status: DC

## 2021-04-17 MED ORDER — AMOXICILLIN 500 MG PO CAPS: 500.0000 mg | ORAL_CAPSULE | Freq: Two times a day (BID) | ORAL | 0 refills | Status: AC

## 2021-04-17 NOTE — ED Provider Notes (Signed)
Emergency Medicine Provider Triage Evaluation Note  DONNESHA KARG , a 50 y.o. female  was evaluated in triage.  Pt complains of cough congestion, rhinorrhea.  No known sick contacts.  No cough.  Has some myalgias.  Review of Systems  Positive: Congestion, rhinorrhea Negative: Cough, shortness of breath  Physical Exam  BP (!) 193/102 (BP Location: Left Arm)   Pulse 65   Temp 98.9 F (37.2 C) (Oral)   Resp 16   SpO2 98%  Gen:   Awake, no distress   Face:  Tenderness over maxillary, frontal sinuses Resp:  Normal effort  MSK:   Moves extremities without difficulty  Other:    Medical Decision Making  Medically screening exam initiated at 11:54 AM.  Appropriate orders placed.  [name] was informed that the remainder of the evaluation will be completed by another provider, this initial triage assessment does not replace that evaluation, and the importance of remaining in the ED until their evaluation is complete.  Congestion   Surina Storts A, PA-C 04/17/21 1155    Arby Barrette, MD 04/17/21 [id number]

## 2021-04-17 NOTE — ED Provider Notes (Signed)
[facility]-EMERGENCY DEPT Provider Note   CSN: [id number] Arrival date & time: 04/17/21  1036     History Chief Complaint  Patient presents with   Generalized Body Aches   Nasal Congestion    Courtney Clayton is a 50 y.o. female with past medical history significant for CHF, HTN, DM who presents for evaluation of congestion and rhinorrhea. Sx x 4 days. Green/yellow congestion. Some mild non productive cough. No CP, SOB, HA, neck pain, Sore throat, abd pain, weakness, numbness. Not taking OTC meds for sx. 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 Denies additional aggravating or alleviating sx. Not taking home BP meds due to no refills. States supposed to be on Clonidine. Hx of resistant HTN.   History obtained from patient and past medical records. No interpretor was used.  HPI     Past Medical History:  Diagnosis Date   Asthma    CHF (congestive heart failure) (HCC)    Depression    Diabetes mellitus without complication (HCC)    Hypertension    MI, old     There are no problems to display for this patient.   Past Surgical History:  Procedure Laterality Date   ABDOMINAL HYSTERECTOMY     BACK SURGERY     GANGLION CYST EXCISION     HERNIA REPAIR     TONSILLECTOMY       OB History   No obstetric history on file.     History reviewed. No pertinent family history.  Social History   Tobacco Use   Smoking status: Never   Smokeless tobacco: Never  Vaping Use   Vaping Use: Never used  Substance Use Topics   Alcohol use: Yes    Comment: rarely   Drug use: Never    Home Medications Prior to Admission medications   Medication Sig Start Date End Date Taking? Authorizing Provider  amoxicillin (AMOXIL) 500 MG capsule Take 1 capsule (500 mg total) by mouth 2 (two) times daily for 7 days. 04/17/21 04/24/21 Yes Marielena Harvell A, PA-C  cloNIDine (CATAPRES) 0.1 MG tablet Take 1 tablet (0.1 mg total) by mouth daily. 04/17/21 05/17/21 Yes  Rubyann Lingle A, PA-C  buPROPion (WELLBUTRIN SR) 100 MG 12 hr tablet Take 100 mg by mouth 2 (two) times daily. 03/20/19   [provider]  Butalbital-APAP-Caffeine 50-300-40 MG CAPS Take 1 capsule by mouth every 4 (four) hours as needed (migraine).  02/17/19   [provider]  cetirizine (ZYRTEC ALLERGY) 10 MG tablet Take 1 tablet (10 mg total) by mouth 2 (two) times daily for 5 days. 04/01/19 04/15/19  Muthersbaugh, Dahlia Client, PA-C  diclofenac Sodium (VOLTAREN) 1 % GEL Apply 4 g topically 4 (four) times daily. 07/16/20   Joy, Shawn C, PA-C  famotidine (PEPCID) 20 MG tablet Take 1 tablet (20 mg total) by mouth 2 (two) times daily. Patient not taking: Reported on 07/10/2019 04/01/19   Muthersbaugh, Dahlia Client, PA-C  hydrocortisone 2.5 % lotion Apply topically 2 (two) times daily. Patient not taking: Reported on 04/15/2019 04/01/19   Muthersbaugh, Dahlia Client, PA-C  losartan (COZAAR) 50 MG tablet Take 50 mg by mouth daily. 02/11/19   [provider]  meloxicam (MOBIC) 15 MG tablet Take 15 mg by mouth daily as needed for pain.  02/11/19   [provider]  metFORMIN (GLUCOPHAGE) 500 MG tablet Take 500 mg by mouth at bedtime. 03/20/19   [provider]  methocarbamol (ROBAXIN) 750 MG tablet Take 1 tablet (750 mg  total) by mouth 2 (two) times daily as needed for muscle spasms (muscle tightness/stiffness). 07/16/20   Joy, Shawn C, PA-C  Metoprolol Tartrate 75 MG TABS Take 75 tablets by mouth 2 (two) times daily. 03/15/19   [provider]  oxyCODONE-acetaminophen (PERCOCET) 5-325 MG tablet Take 1 tablet by mouth every 6 (six) hours as needed. 11/02/20   Bethann Berkshire, MD  OZEMPIC, 1 MG/DOSE, 2 MG/1.5ML SOPN Inject 1 mg into the skin once a week. 02/17/19   [provider]  potassium chloride SA (KLOR-CON) 20 MEQ tablet Take 20 mEq by mouth 2 (two) times daily. 03/21/19   [provider]  predniSONE (DELTASONE) 20 MG tablet Take 2 tablets (40 mg total) by  mouth daily. Patient not taking: Reported on 04/15/2019 04/01/19   Muthersbaugh, Dahlia Client, PA-C  simvastatin (ZOCOR) 20 MG tablet Take 20 mg by mouth daily. 02/11/19   [provider]  temazepam (RESTORIL) 30 MG capsule Take 30 mg by mouth at bedtime as needed for sleep.  02/11/19   [provider]    Allergies    Sulfa antibiotics  Review of Systems   Review of Systems  Constitutional:  Positive for activity change, chills and fatigue. Negative for fever.  HENT:  Positive for congestion, postnasal drip, rhinorrhea, sinus pressure and sinus pain. Negative for sore throat, tinnitus, trouble swallowing and voice change.   Respiratory:  Positive for cough.   Cardiovascular: Negative.   Gastrointestinal: Negative.   Genitourinary: Negative.   Musculoskeletal: Negative.   Skin: Negative.   Neurological: Negative.   All other systems reviewed and are negative.  Physical Exam Updated Vital Signs BP (!) 193/102 (BP Location: Left Arm)   Pulse 65   Temp 98.9 F (37.2 C) (Oral)   Resp 16   Ht 5' 5.25" (1.657 m)   Wt 131.1 kg   SpO2 98%   BMI 47.72 kg/m   Physical Exam Vitals and nursing note reviewed.  Constitutional:      General: She is not in acute distress.    Appearance: She is not ill-appearing, toxic-appearing or diaphoretic.  HENT:     Head: Normocephalic and atraumatic.     Jaw: There is normal jaw occlusion.     Right Ear: Tympanic membrane, ear canal and external ear normal. There is no impacted cerumen. No hemotympanum. Tympanic membrane is not injected, scarred, perforated, erythematous, retracted or bulging.     Left Ear: Tympanic membrane, ear canal and external ear normal. There is no impacted cerumen. No hemotympanum. Tympanic membrane is not injected, scarred, perforated, erythematous, retracted or bulging.     Ears:     Comments: No Mastoid tenderness.    Nose: Nasal tenderness, mucosal edema, congestion and rhinorrhea present. No nasal deformity  or signs of injury. Rhinorrhea is purulent.     Right Turbinates: Enlarged and swollen.     Left Turbinates: Enlarged and swollen.     Right Sinus: Maxillary sinus tenderness and frontal sinus tenderness present.     Left Sinus: Maxillary sinus tenderness and frontal sinus tenderness present.     Comments: Dark yellow rhinorrhea and congestion to bilateral nares.     Mouth/Throat:     Comments: Posterior oropharynx clear.  Mucous membranes moist.  Tonsils without erythema or exudate.  Uvula midline without deviation.  No evidence of PTA or RPA.  No drooling, dysphasia or trismus.  Phonation normal. Neck:     Trachea: Trachea and phonation normal.     Meningeal: Brudzinski's sign and  Kernig's sign absent.     Comments: No Neck stiffness or neck rigidity.  No meningismus.  No cervical lymphadenopathy. Cardiovascular:     Comments: No murmurs rubs or gallops. Pulmonary:     Comments: Clear to auscultation bilaterally without wheeze, rhonchi or rales.  No accessory muscle usage.  Able speak in full sentences. Abdominal:     Comments: Soft, nontender without rebound or guarding.  No CVA tenderness.  Musculoskeletal:     Comments: Moves all 4 extremities without difficulty.  Lower extremities without edema, erythema or warmth.  Skin:    Comments: Brisk capillary refill.  No rashes or lesions.  Neurological:     Mental Status: She is alert.     Comments: Ambulatory in department without difficulty.  Cranial nerves II through XII grossly intact.  No facial droop.  No aphasia.    ED Results / Procedures / Treatments   Labs (all labs ordered are listed, but only abnormal results are displayed) Labs Reviewed  RESP PANEL BY RT-PCR (FLU A&B, COVID) [id number]    EKG None  Radiology No results found.  Procedures Procedures   Medications Ordered in ED Medications - No data to display  ED Course  I have reviewed the triage vital signs and the nursing notes.  Pertinent labs & imaging  results that were available during my care of the patient were reviewed by me and considered in my medical decision making (see chart for details).  Here with congestion and rhinorrhea with associated sinus tenderness. Afebrile, non septic non ill appearing. Heart and lungs clear. PO clear> no PTA,RPA, no neck stiffness or neck rigidity. Non compliant with home BP meds. Tolerating PO intake at home.  COVID< FLU neg here in ED.  HTN here however seems consistent with prior vitis.  No HA, vision changes, CP, SOB, emesis.  Low suspicion for hypertensive urgency or emergency at this time.  Does not want p.o. meds here for blood pressure.  Will write for refill on home meds as well as treat sinuitis.  Courage close follow-up with PCP or return for new or worsening symptoms which she is agreeable for.  The patient has been appropriately medically screened and/or stabilized in the ED. I have low suspicion for any other emergent medical condition which would require further screening, evaluation or treatment in the ED or require inpatient management.  Patient is hemodynamically stable and in no acute distress.  Patient able to ambulate in department prior to ED.  Evaluation does not show acute pathology that would require ongoing or additional emergent interventions while in the emergency department or further inpatient treatment.  I have discussed the diagnosis with the patient and answered all questions.  Pain is been managed while in the emergency department and patient has no further complaints prior to discharge.  Patient is comfortable with plan discussed in room and is stable for discharge at this time.  I have discussed strict return precautions for returning to the emergency department.  Patient was encouraged to follow-up with PCP/specialist refer to at discharge.     MDM Rules/Calculators/A&P                           Final Clinical Impression(s) / ED Diagnoses Final diagnoses:  Acute  non-recurrent frontal sinusitis  Hypertension, unspecified type  Medication refill    Rx / DC Orders ED Discharge Orders          Ordered    cloNIDine (  CATAPRES) 0.1 MG tablet  Daily        04/17/21 1448    amoxicillin (AMOXIL) 500 MG capsule  2 times daily        04/17/21 1448             Kit Brubacher A, PA-C 04/17/21 1450    Arby Barrette, MD 04/17/21 1604

## 2021-04-17 NOTE — Discharge Instructions (Signed)
Take the antibiotics for sinus infection.  Also make sure to follow-up close with a primary care provider as her blood pressure significantly elevated today  Return for new or worsening symptoms such as chest pain, severe headache, numbness, weakness

## 2021-04-17 NOTE — ED Triage Notes (Signed)
Patient c/o nasal congestion and body aches x 4 days.

## 2021-07-05 ENCOUNTER — Emergency Department (HOSPITAL_COMMUNITY)
Admission: EM | Admit: 2021-07-05 | Discharge: 2021-07-05 | Disposition: A | Discharge status: Home / Self Care | Payer: 59 | Attending: Emergency Medicine | Admitting: Emergency Medicine

## 2021-07-05 ENCOUNTER — Emergency Department (HOSPITAL_COMMUNITY): Payer: 59

## 2021-07-05 ENCOUNTER — Encounter (HOSPITAL_COMMUNITY): Payer: Self-pay | Admitting: Emergency Medicine

## 2021-07-05 DIAGNOSIS — M25562 Pain in left knee: Secondary | ICD-10-CM

## 2021-07-05 DIAGNOSIS — M25511 Pain in right shoulder: Secondary | ICD-10-CM | POA: Insufficient documentation

## 2021-07-05 DIAGNOSIS — M62838 Other muscle spasm: Secondary | ICD-10-CM | POA: Diagnosis not present

## 2021-07-05 DIAGNOSIS — M25512 Pain in left shoulder: Secondary | ICD-10-CM | POA: Insufficient documentation

## 2021-07-05 DIAGNOSIS — M542 Cervicalgia: Secondary | ICD-10-CM

## 2021-07-05 MED ORDER — OXYCODONE-ACETAMINOPHEN 5-325 MG PO TABS
1.0000 | ORAL_TABLET | Freq: Once | ORAL | Status: AC
  Administered 2021-07-05: 1 via ORAL
  Filled 2021-07-05: qty 1

## 2021-07-05 MED ORDER — METHOCARBAMOL 500 MG PO TABS
500.0000 mg | ORAL_TABLET | Freq: Once | ORAL | Status: AC
  Administered 2021-07-05: 500 mg via ORAL
  Filled 2021-07-05: qty 1

## 2021-07-05 MED ORDER — LIDOCAINE 5 % EX PTCH
1.0000 | MEDICATED_PATCH | Freq: Once | CUTANEOUS | Status: DC
  Administered 2021-07-05: 1 via TRANSDERMAL
  Filled 2021-07-05: qty 1

## 2021-07-05 MED ORDER — LIDOCAINE 5 % EX PTCH: 1.0000 | MEDICATED_PATCH | CUTANEOUS | 0 refills | Status: DC

## 2021-07-05 MED ORDER — IBUPROFEN 200 MG PO TABS
600.0000 mg | ORAL_TABLET | Freq: Once | ORAL | Status: AC
  Administered 2021-07-05: 600 mg via ORAL
  Filled 2021-07-05: qty 3

## 2021-07-05 MED ORDER — METHOCARBAMOL 500 MG PO TABS: 500.0000 mg | ORAL_TABLET | Freq: Two times a day (BID) | ORAL | 0 refills | Status: DC

## 2021-07-05 NOTE — Discharge Instructions (Addendum)
It was a pleasure taking care of you!   Your x-rays and CT scan were negative for fracture or dislocation.  You will be sent a prescription for Robaxin, do not drive or operate heavy machinery with this medication.  You may take over the counter 600 mg Ibuprofen every 6 hours or 500 mg Tylenol every 6 hours as needed for pain for no more than 7 days.  You will be given a knee immobilizer and crutches.  Use the knee immobilizer and crutches to aid with ambulation throughout the day. You may apply ice (to left knee) or heat (to bilateral shoulders) to affected area for up to 15 minutes at a time. Ensure to place a barrier between your skin and the ice/heat.  Attached is information for the on-call orthopedic doctor office, call and set up a follow-up appointment regarding today's ED visit.  You may follow-up with your primary care provider as needed.  Return to the Emergency Department if you are experiencing increasing/worsening pain, swelling, color change, fever, or worsening symptoms.

## 2021-07-05 NOTE — ED Triage Notes (Signed)
Patient c/o L knee pain worsening x2 months since a fall. Also c/o neck pain.

## 2021-07-05 NOTE — Progress Notes (Signed)
Orthopedic Tech Progress Note Patient Details:  Courtney Clayton 12-07-1970 [id number]  Ortho Devices Type of Ortho Device: Crutches, Knee Immobilizer Ortho Device/Splint Location: left Ortho Device/Splint Interventions: Application   Post Interventions Patient Tolerated: Well Instructions Provided: Care of device, Adjustment of device, Poper ambulation with device  Saul Fordyce 07/05/2021, 7:05 PM

## 2021-07-05 NOTE — ED Provider Notes (Signed)
[facility] DEPT Provider Note   CSN: [id number] Arrival date & time: 07/05/21  1532     History  Chief Complaint  Patient presents with   Knee Pain    Courtney Clayton is a 51 y.o. female presents to the emergency department complaining of worsening left knee pain x2 months.  Patient notes that her left knee pain initially started in October 2022 status post a fall.  She had recurrence of this pain in January 2023 after playing with her grandson.  Notes that the pain has been acutely worsening since.  Patient was not evaluated after both incidents.  Has associated knee swelling.  Has tried over-the-counter Biofreeze, muscle electrodes, over-the-counter medications with no relief for her symptoms.  Denies allergies to medications.  The history is provided by the patient. No language interpreter was used.      Home Medications Prior to Admission medications   Medication Sig Start Date End Date Taking? Authorizing Provider  lidocaine (LIDODERM) 5 % Place 1 patch onto the skin daily. Remove & Discard patch within 12 hours or as directed by MD 07/05/21  Yes Ellenor Wisniewski A, PA-C  methocarbamol (ROBAXIN) 500 MG tablet Take 1 tablet (500 mg total) by mouth 2 (two) times daily. 07/05/21  Yes Martavia Tye A, PA-C  buPROPion (WELLBUTRIN SR) 100 MG 12 hr tablet Take 100 mg by mouth 2 (two) times daily. 03/20/19   [provider]  Butalbital-APAP-Caffeine 50-300-40 MG CAPS Take 1 capsule by mouth every 4 (four) hours as needed (migraine).  02/17/19   [provider]  cetirizine (ZYRTEC ALLERGY) 10 MG tablet Take 1 tablet (10 mg total) by mouth 2 (two) times daily for 5 days. 04/01/19 04/15/19  Muthersbaugh, Jarrett Soho, PA-C  cloNIDine (CATAPRES) 0.1 MG tablet Take 1 tablet (0.1 mg total) by mouth daily. 04/17/21 05/17/21  Henderly, Britni A, PA-C  diclofenac Sodium (VOLTAREN) 1 % GEL Apply 4 g topically 4 (four) times daily. 07/16/20   Joy, Shawn C, PA-C   famotidine (PEPCID) 20 MG tablet Take 1 tablet (20 mg total) by mouth 2 (two) times daily. Patient not taking: Reported on 07/10/2019 04/01/19   Muthersbaugh, Jarrett Soho, PA-C  hydrocortisone 2.5 % lotion Apply topically 2 (two) times daily. Patient not taking: Reported on 04/15/2019 04/01/19   Muthersbaugh, Jarrett Soho, PA-C  losartan (COZAAR) 50 MG tablet Take 50 mg by mouth daily. 02/11/19   [provider]  meloxicam (MOBIC) 15 MG tablet Take 15 mg by mouth daily as needed for pain.  02/11/19   [provider]  metFORMIN (GLUCOPHAGE) 500 MG tablet Take 500 mg by mouth at bedtime. 03/20/19   [provider]  Metoprolol Tartrate 75 MG TABS Take 75 tablets by mouth 2 (two) times daily. 03/15/19   [provider]  oxyCODONE-acetaminophen (PERCOCET) 5-325 MG tablet Take 1 tablet by mouth every 6 (six) hours as needed. 11/02/20   Milton Ferguson, MD  OZEMPIC, 1 MG/DOSE, 2 MG/1.5ML SOPN Inject 1 mg into the skin once a week. 02/17/19   [provider]  potassium chloride SA (KLOR-CON) 20 MEQ tablet Take 20 mEq by mouth 2 (two) times daily. 03/21/19   [provider]  predniSONE (DELTASONE) 20 MG tablet Take 2 tablets (40 mg total) by mouth daily. Patient not taking: Reported on 04/15/2019 04/01/19   Muthersbaugh, Jarrett Soho, PA-C  simvastatin (ZOCOR) 20 MG tablet Take 20 mg by mouth daily. 02/11/19   [provider]  temazepam (RESTORIL) 30 MG capsule Take 30  mg by mouth at bedtime as needed for sleep.  02/11/19   [provider]      Allergies    Sulfa antibiotics    Review of Systems   Review of Systems  Constitutional:  Negative for chills and fever.  Musculoskeletal:  Positive for arthralgias and joint swelling. Negative for gait problem.  Skin:  Negative for color change, rash and wound.  All other systems reviewed and are negative.  Physical Exam Updated Vital Signs BP (!) 174/114 (BP Location: Left Arm)    Pulse 62    Temp (!) 97.5 F  (36.4 C) (Oral)    Resp 18    SpO2 100%  Physical Exam Vitals and nursing note reviewed.  Constitutional:      General: She is not in acute distress.    Appearance: Normal appearance.  Eyes:     General: No scleral icterus.    Extraocular Movements: Extraocular movements intact.  Cardiovascular:     Rate and Rhythm: Normal rate.  Pulmonary:     Effort: Pulmonary effort is normal. No respiratory distress.  Musculoskeletal:     Right shoulder: Tenderness present. No swelling, deformity or bony tenderness. Normal range of motion.     Left shoulder: Tenderness present. No swelling, deformity or bony tenderness. Normal range of motion.     Cervical back: Neck supple. Spasms, tenderness and bony tenderness present.     Thoracic back: Normal.     Lumbar back: Normal.     Right knee: Normal.     Left knee: Bony tenderness present. No swelling, deformity, effusion, erythema, ecchymosis or lacerations. Decreased range of motion (Secondary to pain). Tenderness present over the lateral joint line and patellar tendon.     Comments: Moderate tenderness to palpation to left quadriceps tendon.  Lateral aspect of the left knee and patellar tendon.. No obvious deformity, effusion, erythema, or swelling.  Decreased range of motion secondary to pain.  Tenderness noted with flexion of left knee.  Able to extend without pain.  No increased warmth noted to the joint.  Tenderness to palpation noted to cervical spine and bilateral trapezius.  Spasm noted to bilateral trapezius.  No T, L, S spinal tenderness to palpation.  No tenderness to palpation noted to remainder of musculature of back.  Radial and pedal pulses intact bilaterally.  Sensation intact bilaterally.  Skin:    General: Skin is warm and dry.     Findings: No bruising, erythema or rash.  Neurological:     Mental Status: She is alert.  Psychiatric:        Behavior: Behavior normal.    ED Results / Procedures / Treatments   Labs (all labs ordered  are listed, but only abnormal results are displayed) Labs Reviewed - No data to display  EKG None  Radiology DG Cervical Spine Complete  Result Date: 07/05/2021 CLINICAL DATA:  Chronic neck pain, worsening recently. EXAM: CERVICAL SPINE - COMPLETE 4+ VIEW COMPARISON:  07/15/2020 FINDINGS: Straightening of the normal cervical lordosis. No antero or retrolisthesis. No disc space narrowing. Anterior bridging osteophytes. Osteoarthritis at the C1-2 articulation. Facet osteoarthritis which could certainly be painful. No evidence of compressive bony foraminal narrowing. IMPRESSION: Chronic straightening of the cervical lordosis. Prominent anterior osteophytes. Chronic facet arthropathy. No apparent compressive bony stenosis of the canal foramina. Findings could relate to regional pain. Electronically Signed   By: Nelson Chimes M.D.   On: 07/05/2021 16:49   CT Cervical Spine Wo Contrast  Result Date: 07/05/2021 CLINICAL  DATA:  Neck pain EXAM: CT CERVICAL SPINE WITHOUT CONTRAST TECHNIQUE: Multidetector CT imaging of the cervical spine was performed without intravenous contrast. Multiplanar CT image reconstructions were also generated. RADIATION DOSE REDUCTION: This exam was performed according to the departmental dose-optimization program which includes automated exposure control, adjustment of the mA and/or kV according to patient size and/or use of iterative reconstruction technique. COMPARISON:  Radiograph same day FINDINGS: Alignment: Straightening of the normal cervical lordosis. Skull base and vertebrae: No acute loss vertebral body height and disc height. Normal craniocervical junction. Multilevel disc osteophytic disease with anterior bridging osteophytes most severe from C4-C7. No neural foraminal stenosis. Facet hypertrophy prominent at C7. Soft tissues and spinal canal: Unremarkable Disc levels: No neural foraminal narrowing. Multilevel disc osteophytic disease as above. Upper chest: Unremarkable  Other: None IMPRESSION: 1. No acute findings of the cervical spine. 2. 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 3. No neural foraminal narrowing. Electronically Signed   By: Suzy Bouchard M.D.   On: 07/05/2021 18:42   DG Knee Complete 4 Views Left  Result Date: 07/05/2021 CLINICAL DATA:  Knee pain over the last several days which is worsening. EXAM: LEFT KNEE - COMPLETE 4+ VIEW COMPARISON:  04/16/2018 FINDINGS: Small knee joint effusion. Medial compartment osteoarthritis with joint space narrowing and marginal osteophytes. Probable intra-articular loose bodies in the intercondylar notch. Chronic enthesophytes at the quadriceps and patellar tendon attachments. Findings have worsened since 2019 IMPRESSION: Small knee joint effusion. Medial compartment osteoarthritis. Suspicion of loose bodies in the intercondylar notch. Patellar enthesophytes. Findings have worsened since 2019. Electronically Signed   By: Nelson Chimes M.D.   On: 07/05/2021 16:47    Procedures Procedures    Medications Ordered in ED Medications  lidocaine (LIDODERM) 5 % 1 patch (1 patch Transdermal Patch Applied 07/05/21 1648)  oxyCODONE-acetaminophen (PERCOCET/ROXICET) 5-325 MG per tablet 1 tablet (1 tablet Oral Given 07/05/21 1647)  methocarbamol (ROBAXIN) tablet 500 mg (500 mg Oral Given 07/05/21 1647)  ibuprofen (ADVIL) tablet 600 mg (600 mg Oral Given 07/05/21 1646)    ED Course/ Medical Decision Making/ A&P Clinical Course as of 07/05/21 2202  Sun Jul 05, 2021  1712 Pt pain relieved with treatment regimen in the ED. Improved ROM of left knee following treatment regimen.  [SB]  1714 Discussed with pt will obtain CT cervical spine due to continued pain. Pt agreeable at this time.  [SB]  1856 Patient reevaluated and noted to be resting comfortably on stretcher.  Discussed CT scan findings with patient.  Also discussed discharge treatment plan with patient.  Patient agreeable at this time.  Patient  appears safe for discharge. [SB]    Clinical Course User Index [SB] Deserie Dirks A, PA-C                           Medical Decision Making Amount and/or Complexity of Data Reviewed Radiology: ordered.  Risk OTC drugs. Prescription drug management.   Patient with left knee pain pain ongoing since initial fall in October 2022.  Pain was exacerbated while playing with her grandson in January 2023.  Patient was not evaluated for her symptoms.  Vital signs stable, patient afebrile, not tachycardic or hypoxic. On exam, patient with moderate tenderness to palpation to left quadriceps tendon, lateral aspect of left knee, patellar tendon. No edema, erythema, effusion. Sensation and pulses intact bilaterally to lower extremities.  Patient also with tenderness to palpation noted to cervical spine and bilateral trapezius with  spasm noted to bilateral trapezius muscles.  No remaining spinal tenderness to palpation or musculature of the back tenderness to palpation. Differential diagnosis includes fracture, dislocation, herniation, septic arthritis, osteoarthritis, strain, spasm.    Imaging: I ordered imaging studies including left knee and cervical spine x-ray I independently visualized and interpreted imaging which showed:  Left knee x-ray:  Small knee joint effusion. Medial compartment osteoarthritis.  Suspicion of loose bodies in the intercondylar notch. Patellar  enthesophytes.    Cervical spine x-ray:  Chronic straightening of the cervical lordosis. Prominent anterior  osteophytes. Chronic facet arthropathy. No apparent compressive bony  stenosis of the canal foramina.    CT cervical spine without:  1. No acute findings of the cervical spine.  2. Multilevel disc osteophytic disease with bulky anterior  osteophytosis.  3. No neural foraminal narrowing.   I agree with the radiologist interpretation  Medications:  I ordered medication including Percocet, Robaxin, lidocaine patch for  pain management Reevaluation of the patient after these medicines showed that the patient improved I have reviewed the patients home medicines and have made adjustments as needed   Disposition: Patient presentation suspicious for effusion of left knee, doubt fracture or dislocation of the left knee.  Presentation also suspicious for bilateral spasm to trapezius, doubt cervical fracture or herniation at this time.. After consideration of the diagnostic results and the patients response to treatment, I feel that the patient would benefit from discharge home with Robaxin and Lidoderm patch.  Discussed with patient to not drive or operate heavy machinery with this medication.  Provided information for on-call orthopedist and instructed patient to call and set up a follow-up appointment regarding today's ED visit.  Also discussed patient can follow-up with her primary care provider as needed.  Supportive care measures and strict return precautions discussed with patient at bedside. Pt acknowledges and verbalizes understanding. Pt appears safe for discharge. Follow up as indicated in discharge paperwork.    This chart was dictated using voice recognition software, Dragon. Despite the best efforts of this provider to proofread and correct errors, errors may still occur which can change documentation meaning.  Final Clinical Impression(s) / ED Diagnoses Final diagnoses:  Neck pain  Left knee pain  Trapezius muscle spasm    Rx / DC Orders ED Discharge Orders          Ordered    methocarbamol (ROBAXIN) 500 MG tablet  2 times daily        07/05/21 1855    lidocaine (LIDODERM) 5 %  Every 24 hours        07/05/21 1855              Alianny Toelle A, PA-C 07/05/21 2202    Gareth Morgan, MD 07/06/21 [phone]

## 2021-10-02 ENCOUNTER — Other Ambulatory Visit: Payer: Self-pay

## 2021-10-02 ENCOUNTER — Encounter (HOSPITAL_COMMUNITY): Payer: Self-pay | Admitting: Emergency Medicine

## 2021-10-02 ENCOUNTER — Emergency Department (HOSPITAL_COMMUNITY): Payer: 59

## 2021-10-02 ENCOUNTER — Observation Stay (HOSPITAL_COMMUNITY)
Admission: EM | Admit: 2021-10-02 | Discharge: 2021-10-04 | Disposition: A | Discharge status: Home / Self Care | Payer: 59 | Attending: Emergency Medicine | Admitting: Emergency Medicine

## 2021-10-02 DIAGNOSIS — Z20822 Contact with and (suspected) exposure to covid-19: Secondary | ICD-10-CM | POA: Insufficient documentation

## 2021-10-02 DIAGNOSIS — I509 Heart failure, unspecified: Secondary | ICD-10-CM | POA: Insufficient documentation

## 2021-10-02 DIAGNOSIS — I110 Hypertensive heart disease with heart failure: Secondary | ICD-10-CM | POA: Insufficient documentation

## 2021-10-02 DIAGNOSIS — Z79899 Other long term (current) drug therapy: Secondary | ICD-10-CM | POA: Insufficient documentation

## 2021-10-02 DIAGNOSIS — R0602 Shortness of breath: Secondary | ICD-10-CM | POA: Diagnosis present

## 2021-10-02 DIAGNOSIS — J4541 Moderate persistent asthma with (acute) exacerbation: Secondary | ICD-10-CM | POA: Diagnosis not present

## 2021-10-02 DIAGNOSIS — E876 Hypokalemia: Secondary | ICD-10-CM | POA: Diagnosis not present

## 2021-10-02 DIAGNOSIS — Z6841 Body Mass Index (BMI) 40.0 and over, adult: Secondary | ICD-10-CM | POA: Insufficient documentation

## 2021-10-02 DIAGNOSIS — R0789 Other chest pain: Secondary | ICD-10-CM | POA: Insufficient documentation

## 2021-10-02 DIAGNOSIS — Z7984 Long term (current) use of oral hypoglycemic drugs: Secondary | ICD-10-CM | POA: Diagnosis not present

## 2021-10-02 DIAGNOSIS — R079 Chest pain, unspecified: Secondary | ICD-10-CM | POA: Diagnosis present

## 2021-10-02 DIAGNOSIS — F32A Depression, unspecified: Secondary | ICD-10-CM | POA: Diagnosis present

## 2021-10-02 DIAGNOSIS — I160 Hypertensive urgency: Secondary | ICD-10-CM | POA: Diagnosis present

## 2021-10-02 DIAGNOSIS — E119 Type 2 diabetes mellitus without complications: Secondary | ICD-10-CM

## 2021-10-02 DIAGNOSIS — J45909 Unspecified asthma, uncomplicated: Secondary | ICD-10-CM | POA: Diagnosis present

## 2021-10-02 DIAGNOSIS — R519 Headache, unspecified: Secondary | ICD-10-CM | POA: Diagnosis present

## 2021-10-02 DIAGNOSIS — J45901 Unspecified asthma with (acute) exacerbation: Secondary | ICD-10-CM | POA: Diagnosis present

## 2021-10-02 LAB — CBC
HCT: 39.1 % (ref 36.0–46.0)
Hemoglobin: 12.4 g/dL (ref 12.0–15.0)
MCH: 27.3 pg (ref 26.0–34.0)
MCHC: 31.7 g/dL (ref 30.0–36.0)
MCV: 85.9 fL (ref 80.0–100.0)
Platelets: 267 10*3/uL (ref 150–400)
RBC: 4.55 MIL/uL (ref 3.87–5.11)
RDW: 14.1 % (ref 11.5–15.5)
WBC: 10 10*3/uL (ref 4.0–10.5)
nRBC: 0 % (ref 0.0–0.2)

## 2021-10-02 LAB — TROPONIN I (HIGH SENSITIVITY): Troponin I (High Sensitivity): 5 ng/L (ref <18)

## 2021-10-02 LAB — BASIC METABOLIC PANEL
Anion gap: 7 (ref 5–15)
BUN: 16 mg/dL (ref 6–20)
CO2: 25 mmol/L (ref 22–32)
Calcium: 9.1 mg/dL (ref 8.9–10.3)
Chloride: 105 mmol/L (ref 98–111)
Creatinine, Ser: 0.83 mg/dL (ref 0.44–1.00)
GFR, Estimated: >60 mL/min (ref >60)
Glucose, Bld: 115 mg/dL — ABNORMAL HIGH (ref 70–99)
Potassium: 3.4 mmol/L — ABNORMAL LOW (ref 3.5–5.1)
Sodium: 137 mmol/L (ref 135–145)

## 2021-10-02 LAB — BRAIN NATRIURETIC PEPTIDE: B Natriuretic Peptide: 63.2 pg/mL (ref 0.0–100.0)

## 2021-10-02 LAB — I-STAT BETA HCG BLOOD, ED (MC, WL, AP ONLY): I-stat hCG, quantitative: <5 m[IU]/mL (ref <5)

## 2021-10-02 MED ORDER — HYDRALAZINE HCL 20 MG/ML IJ SOLN
10.0000 mg | Freq: Once | INTRAMUSCULAR | Status: AC
  Administered 2021-10-03: 10 mg via INTRAVENOUS
  Filled 2021-10-02: qty 1

## 2021-10-02 MED ORDER — DEXAMETHASONE SODIUM PHOSPHATE 10 MG/ML IJ SOLN
10.0000 mg | Freq: Once | INTRAMUSCULAR | Status: AC
  Administered 2021-10-02: 10 mg via INTRAVENOUS
  Filled 2021-10-02: qty 1

## 2021-10-02 MED ORDER — IPRATROPIUM-ALBUTEROL 0.5-2.5 (3) MG/3ML IN SOLN
3.0000 mL | RESPIRATORY_TRACT | Status: DC | PRN
  Administered 2021-10-02 – 2021-10-03 (×2): 3 mL via RESPIRATORY_TRACT
  Filled 2021-10-02 (×2): qty 3

## 2021-10-02 NOTE — ED Triage Notes (Signed)
Pt presented to ED with c/o shortness of breath and chest pain that has been ongoing throughout the day. Pt states she has had a hx of "fluid building up around heart". Pt also states she has vomited a couple of times throughout the day. Some swelling noted to BLE.

## 2021-10-02 NOTE — ED Provider Notes (Incomplete)
[facility] EMERGENCY DEPARTMENT Provider Note   CSN: [id number] Arrival date & time: 10/02/21  2210     History {Add pertinent medical, surgical, social history, OB history to HPI:1} Chief Complaint  Patient presents with  . Chest Pain  . Shortness of Breath    Courtney Clayton is a 51 y.o. female.  HPI 51 year old female with a history of CHF with an unknow EF, asthma, HTN, depression, MI, DMTII presents to the ER with  complaints of chest pain and SOB for the last several days, worsening today.  Reports she feels like she may have an asthma exacerbation in the she does not know exactly where her chest pain is coming from.  She did take an acid with no relief.  She does feel like her chest is "spasming", and has had a history of this prior in the past.  She has been hospitalized once for her asthma (per chart review appears to have been hospitalized at [facility] in 2019).  She endorses a dry cough, no fevers, chills.  Has had several episodes of posttussive emesis but no abdominal pain.  She notes some lower extremity swelling but this is not uncommon for her and appears to be at baseline.  She has had no recent travel, no exogenous estrogen, no recent surgeries, no history of PE or DVT.  No syncopal events.  She reports taking her home albuterol and nebulizer with little relief. She notes compliance with antihypertensive medications     Home Medications Prior to Admission medications   Medication Sig Start Date End Date Taking? Authorizing Provider  buPROPion (WELLBUTRIN SR) 100 MG 12 hr tablet Take 100 mg by mouth 2 (two) times daily. 03/20/19   [provider]  Butalbital-APAP-Caffeine 50-300-40 MG CAPS Take 1 capsule by mouth every 4 (four) hours as needed (migraine).  02/17/19   [provider]  cetirizine (ZYRTEC ALLERGY) 10 MG tablet Take 1 tablet (10 mg total) by mouth 2 (two) times daily for 5 days. 04/01/19 04/15/19  Muthersbaugh, Dahlia Client, PA-C   cloNIDine (CATAPRES) 0.1 MG tablet Take 1 tablet (0.1 mg total) by mouth daily. 04/17/21 05/17/21  Henderly, Britni A, PA-C  diclofenac Sodium (VOLTAREN) 1 % GEL Apply 4 g topically 4 (four) times daily. 07/16/20   Joy, Shawn C, PA-C  famotidine (PEPCID) 20 MG tablet Take 1 tablet (20 mg total) by mouth 2 (two) times daily. Patient not taking: Reported on 07/10/2019 04/01/19   Muthersbaugh, Dahlia Client, PA-C  hydrocortisone 2.5 % lotion Apply topically 2 (two) times daily. Patient not taking: Reported on 04/15/2019 04/01/19   Muthersbaugh, Dahlia Client, PA-C  lidocaine (LIDODERM) 5 % Place 1 patch onto the skin daily. Remove & Discard patch within 12 hours or as directed by MD 07/05/21   Blue, Soijett A, PA-C  losartan (COZAAR) 50 MG tablet Take 50 mg by mouth daily. 02/11/19   [provider]  meloxicam (MOBIC) 15 MG tablet Take 15 mg by mouth daily as needed for pain.  02/11/19   [provider]  metFORMIN (GLUCOPHAGE) 500 MG tablet Take 500 mg by mouth at bedtime. 03/20/19   [provider]  methocarbamol (ROBAXIN) 500 MG tablet Take 1 tablet (500 mg total) by mouth 2 (two) times daily. 07/05/21   Blue, Soijett A, PA-C  Metoprolol Tartrate 75 MG TABS Take 75 tablets by mouth 2 (two) times daily. 03/15/19   [provider]  oxyCODONE-acetaminophen (PERCOCET) 5-325 MG tablet Take 1 tablet by mouth every 6 (six)  hours as needed. 11/02/20   Bethann BerkshireZammit, Joseph, MD  OZEMPIC, 1 MG/DOSE, 2 MG/1.5ML SOPN Inject 1 mg into the skin once a week. 02/17/19   [provider]  potassium chloride SA (KLOR-CON) 20 MEQ tablet Take 20 mEq by mouth 2 (two) times daily. 03/21/19   [provider]  predniSONE (DELTASONE) 20 MG tablet Take 2 tablets (40 mg total) by mouth daily. Patient not taking: Reported on 04/15/2019 04/01/19   Muthersbaugh, Dahlia ClientHannah, PA-C  simvastatin (ZOCOR) 20 MG tablet Take 20 mg by mouth daily. 02/11/19   [provider]  temazepam (RESTORIL) 30 MG capsule Take  30 mg by mouth at bedtime as needed for sleep.  02/11/19   [provider]      Allergies    Sulfa antibiotics    Review of Systems   Review of Systems Ten systems reviewed and are negative for acute change, except as noted in the HPI.   Physical Exam Updated Vital Signs BP (!) 193/132   Pulse (!) 57   Temp 98.4 F (36.9 C) (Oral)   Resp 16   SpO2 100%  Physical Exam Vitals and nursing note reviewed.  Constitutional:      General: She is not in acute distress.    Appearance: She is well-developed.  HENT:     Head: Normocephalic and atraumatic.  Eyes:     Conjunctiva/sclera: Conjunctivae normal.  Cardiovascular:     Rate and Rhythm: Normal rate and regular rhythm.     Heart sounds: No murmur heard. Pulmonary:     Effort: Pulmonary effort is normal. Tachypnea present. No accessory muscle usage or respiratory distress.     Breath sounds: Normal breath sounds.     Comments: Lung sounds decreased throughout with faint wheezes Abdominal:     Palpations: Abdomen is soft.     Tenderness: There is no abdominal tenderness.  Musculoskeletal:        General: No swelling.     Cervical back: Neck supple.     Comments: Trace bilateral lower extremity edema, no warmth, no unilateral swelling, 2+ DP pulses  Skin:    General: Skin is warm and dry.     Capillary Refill: Capillary refill takes less than 2 seconds.  Neurological:     Mental Status: She is alert.  Psychiatric:        Mood and Affect: Mood normal.     ED Results / Procedures / Treatments   Labs (all labs ordered are listed, but only abnormal results are displayed) Labs Reviewed  BASIC METABOLIC PANEL - Abnormal; Notable for the following components:      Result Value   Potassium 3.4 (*)    Glucose, Bld 115 (*)    All other components within normal limits  RESP PANEL BY RT-PCR (FLU A&B, COVID) ARPGX2  CBC  BRAIN NATRIURETIC PEPTIDE  I-STAT BETA HCG BLOOD, ED ([facility], [facility], [facility] ONLY)  TROPONIN I (HIGH  SENSITIVITY)    EKG None  Radiology DG Chest 2 View  Result Date: 10/02/2021 CLINICAL DATA:  Chest pain, shortness of breath. EXAM: CHEST - 2 VIEW COMPARISON:  03/02/2021. FINDINGS: The heart size and mediastinal contours are within normal limits. No consolidation, effusion, or pneumothorax. Degenerative changes are noted in the thoracic spine. IMPRESSION: No active cardiopulmonary disease. Electronically Signed   By: Thornell SartoriusLaura  Taylor M.D.   On: 10/02/2021 22:46    Procedures Procedures  {Document cardiac monitor, telemetry assessment procedure when appropriate:1}  Medications Ordered in ED Medications  ipratropium-albuterol (DUONEB) 0.5-2.5 (3) MG/3ML nebulizer solution 3 mL (has no administration in time range)  dexamethasone (DECADRON) injection 10 mg (has no administration in time range)    ED Course/ Medical Decision Making/ A&P                           Medical Decision Making Amount and/or Complexity of Data Reviewed Labs: ordered. Radiology: ordered.  Risk Prescription drug management.   This patient presents to the ED for concern of ***, this involves a number of treatment options, and is a complaint that carries with it a *** risk of complications and morbidity.  The differential diagnosis includes ***   Co morbidities: Discussed in HPI   Brief History:  ***  EMR reviewed including pt PMHx, past surgical history and past visits to ER.   See HPI for more details   Lab Tests:  I ordered and independently interpreted labs.  The pertinent results include:    {Blank single:19197::"Labs notable for","I personally reviewed all laboratory work and imaging. Metabolic panel without any acute abnormality specifically kidney function within normal limits and no significant electrolyte abnormalities. CBC without leukocytosis or significant anemia."}   Imaging Studies:  {Blank single:19197::"NAD. I personally reviewed all imaging studies and no acute abnormality  found. I agree with radiology interpretation.","Abnormal findings. I personally reviewed all imaging studies. Imaging notable for","No imaging studies ordered for this patient"}    Cardiac Monitoring:  .{Blank single:19197::"The patient was maintained on a cardiac monitor.  I personally viewed and interpreted the cardiac monitored which showed an underlying rhythm of:","NA"} .{Blank single:19197::"EKG non-ischemic","NA"}   Medicines ordered:  I ordered medication including ***  for *** Reevaluation of the patient after these medicines showed that the patient {resolved/improved/worsened:23923::"improved"} I have reviewed the patients home medicines and have made adjustments as needed   Critical Interventions:    Consults:  I requested consultation with ***,  and discussed lab and imaging findings as well as pertinent plan - they recommend: ***    Reevaluation:  After the interventions noted above I re-evaluated patient and found that they have :{resolved/improved/worsened:23923::"improved"}   Social Determinants of Health:  Marland Kitchen{Blank single:19197::"Given cab voucher","Social work/case management involved","The patient's social determinants of health were a factor in the care of this patient"}    Problem List / ED Course:  PERC negative    Dispostion:  After consideration of the diagnostic results and the patients response to treatment, I feel that the patent would benefit from ***    Final Clinical Impression(s) / ED Diagnoses Final diagnoses:  None    Rx / DC Orders ED Discharge Orders     None

## 2021-10-02 NOTE — ED Provider Notes (Signed)
[facility] EMERGENCY DEPARTMENT Provider Note   CSN: [id number] Arrival date & time: 10/02/21  2210     History  Chief Complaint  Patient presents with   Chest Pain   Shortness of Breath    Courtney Clayton is a 51 y.o. female.  HPI 51 year old female with a history of CHF with an unknow EF, asthma, HTN, depression, MI, DMTII presents to the ER with  complaints of chest pain and SOB for the last several days, worsening today.  Reports she feels like she may have an asthma exacerbation in the she does not know exactly where her chest pain is coming from.  She did take an acid with no relief.  She does feel like her chest is "spasming", and has had a history of this prior in the past.  She has been hospitalized once for her asthma (per chart review appears to have been hospitalized at [facility] in 2019).  She endorses a dry cough, no fevers, chills.  Has had several episodes of posttussive emesis but no abdominal pain.  She notes some lower extremity swelling but this is not uncommon for her and appears to be at baseline.  She has had no recent travel, no exogenous estrogen, no recent surgeries, no history of PE or DVT.  No syncopal events.  She reports taking her home albuterol and nebulizer with little relief. She notes compliance with antihypertensive medications     Home Medications Prior to Admission medications   Medication Sig Start Date End Date Taking? Authorizing Provider  acetaminophen (TYLENOL) 500 MG tablet Take 1,000 mg by mouth every 6 (six) hours as needed for moderate pain or headache.   Yes [provider]  allopurinol (ZYLOPRIM) 100 MG tablet Take 100 mg by mouth daily. 07/12/21  Yes [provider]  buPROPion (WELLBUTRIN XL) 150 MG 24 hr tablet Take 450 mg by mouth every morning. 07/12/21  Yes [provider]  calcium carbonate (TUMS - DOSED IN MG ELEMENTAL CALCIUM) 500 MG chewable tablet Chew 2 tablets by mouth daily as needed for  indigestion or heartburn.   Yes [provider]  diclofenac Sodium (VOLTAREN) 1 % GEL Apply 4 g topically 4 (four) times daily. Patient taking differently: Apply 4 g topically 4 (four) times daily as needed (knee pain). 07/16/20  Yes Joy, Shawn C, PA-C  losartan (COZAAR) 50 MG tablet Take 50 mg by mouth daily. 02/11/19  Yes [provider]  metFORMIN (GLUCOPHAGE) 500 MG tablet Take 1,000 mg by mouth 2 (two) times daily with a meal. 03/20/19  Yes [provider]  Metoprolol Tartrate 75 MG TABS Take 75 tablets by mouth 2 (two) times daily. 03/15/19  Yes [provider]  pioglitazone (ACTOS) 15 MG tablet Take 15 mg by mouth daily. 07/12/21  Yes [provider]  simvastatin (ZOCOR) 20 MG tablet Take 20 mg by mouth at bedtime. 02/11/19  Yes [provider]  temazepam (RESTORIL) 30 MG capsule Take 30 mg by mouth at bedtime as needed for sleep.  02/11/19  Yes [provider]  cloNIDine (CATAPRES) 0.1 MG tablet Take 1 tablet (0.1 mg total) by mouth daily. Patient not taking: Reported on 10/03/2021 04/17/21 05/17/21  Henderly, Britni A, PA-C  famotidine (PEPCID) 20 MG tablet Take 1 tablet (20 mg total) by mouth 2 (two) times daily. Patient not taking: Reported on 07/10/2019 04/01/19   Muthersbaugh, Dahlia Client, PA-C  hydrocortisone 2.5 % lotion Apply topically 2 (two) times daily. Patient not taking:  Reported on 04/15/2019 04/01/19   Muthersbaugh, Dahlia ClientHannah, PA-C  lidocaine (LIDODERM) 5 % Place 1 patch onto the skin daily. Remove & Discard patch within 12 hours or as directed by MD Patient not taking: Reported on 10/03/2021 07/05/21   Blue, Soijett A, PA-C  methocarbamol (ROBAXIN) 500 MG tablet Take 1 tablet (500 mg total) by mouth 2 (two) times daily. Patient not taking: Reported on 10/03/2021 07/05/21   Blue, Soijett A, PA-C      Allergies    Sulfa antibiotics    Review of Systems   Review of Systems Ten systems reviewed and are negative for acute change, except  as noted in the HPI.   Physical Exam Updated Vital Signs BP (!) 147/86   Pulse 61   Temp 98.4 F (36.9 C) (Oral)   Resp (!) 22   SpO2 100%  Physical Exam Vitals and nursing note reviewed.  Constitutional:      General: She is not in acute distress.    Appearance: She is well-developed.  HENT:     Head: Normocephalic and atraumatic.  Eyes:     Conjunctiva/sclera: Conjunctivae normal.  Cardiovascular:     Rate and Rhythm: Normal rate and regular rhythm.     Heart sounds: No murmur heard. Pulmonary:     Effort: Pulmonary effort is normal. Tachypnea present. No accessory muscle usage or respiratory distress.     Breath sounds: Normal breath sounds.     Comments: Lung sounds decreased throughout with faint wheezes Abdominal:     Palpations: Abdomen is soft.     Tenderness: There is no abdominal tenderness.  Musculoskeletal:        General: No swelling.     Cervical back: Neck supple.     Comments: Trace bilateral lower extremity edema, no warmth, no unilateral swelling, 2+ DP pulses  Skin:    General: Skin is warm and dry.     Capillary Refill: Capillary refill takes less than 2 seconds.  Neurological:     Mental Status: She is alert.  Psychiatric:        Mood and Affect: Mood normal.     ED Results / Procedures / Treatments   Labs (all labs ordered are listed, but only abnormal results are displayed) Labs Reviewed  BASIC METABOLIC PANEL - Abnormal; Notable for the following components:      Result Value   Potassium 3.4 (*)    Glucose, Bld 115 (*)    All other components within normal limits  RESP PANEL BY RT-PCR (FLU A&B, COVID) ARPGX2  CBC  BRAIN NATRIURETIC PEPTIDE  I-STAT BETA HCG BLOOD, ED ([facility], [facility], [facility] ONLY)  TROPONIN I (HIGH SENSITIVITY)  TROPONIN I (HIGH SENSITIVITY)    EKG None  Radiology DG Chest 2 View  Result Date: 10/02/2021 CLINICAL DATA:  Chest pain, shortness of breath. EXAM: CHEST - 2 VIEW COMPARISON:  03/02/2021. FINDINGS: The heart size  and mediastinal contours are within normal limits. No consolidation, effusion, or pneumothorax. Degenerative changes are noted in the thoracic spine. IMPRESSION: No active cardiopulmonary disease. Electronically Signed   By: Thornell SartoriusLaura  Taylor M.D.   On: 10/02/2021 22:46    Procedures Procedures    Medications Ordered in ED Medications  ipratropium-albuterol (DUONEB) 0.5-2.5 (3) MG/3ML nebulizer solution 3 mL (3 mLs Nebulization Given 10/03/21 0125)  ipratropium-albuterol (DUONEB) 0.5-2.5 (3) MG/3ML nebulizer solution 3 mL (3 mLs Nebulization Given 10/03/21 0343)  magnesium sulfate IVPB 2 g 50 mL (2 g Intravenous New Bag/Given 10/03/21 0343)  dexamethasone (  DECADRON) injection 10 mg (10 mg Intravenous Given 10/02/21 2325)  hydrALAZINE (APRESOLINE) injection 10 mg (10 mg Intravenous Given 10/03/21 0001)    ED Course/ Medical Decision Making/ A&P                            Medical Decision Making Amount and/or Complexity of Data Reviewed Labs: ordered. Decision-making details documented in ED Course. Radiology: ordered.  Risk Prescription drug management. Decision regarding hospitalization.   This patient presents to the ED for concern of shortness of breath, concerns for asthma exacerbation, this involves a number of treatment options, and is a complaint that carries with it a high risk of complications and morbidity.  The differential diagnosis includes asthma exacerbation, pneumonia, PE, pericardial effusion, pneumothorax   Co morbidities: Discussed in HPI   Brief History:  51 year old female with worsening shortness of breath over the last several days.  Concern for asthma exacerbation given prior history.  EMR reviewed including pt PMHx, past surgical history and past visits to ER.   See HPI for more details   Lab Tests:  I ordered and independently interpreted labs.  The pertinent results include:    Labs notable for Mild hypokalemia 3.4, COVID and flu are negative, CBC  without leukocytosis.  Imaging Studies:  NAD. I personally reviewed all imaging studies and no acute abnormality found. I agree with radiology interpretation.    Cardiac Monitoring:  The patient was maintained on a cardiac monitor.  I personally viewed and interpreted the cardiac monitored which showed an underlying rhythm of: EKG non-ischemic   Medicines ordered:  I ordered medication including DuoNeb treatments, mag, steroids Reevaluation of the patient after these medicines showed that the patient stayed the same I have reviewed the patients home medicines and have made adjustments as needed   Critical Interventions: NA    Consults: N/A   Reevaluation:  After the interventions noted above I re-evaluated patient and found that they have :stayed the same   Social Determinants of Health: N/A  Problem List / ED Course:  51 year old female here with asthma exacerbation.  PERC negative, not hypoxic, on room air.  Low suspicion for PE.  Chest x-ray without evidence of pneumonia, pneumothorax, pericardial effusion.  Presentation likely consistent with asthma.  Lab work overall unremarkable.  EKG nonischemic.  Unfortunately the patient symptoms were not relieved with multiple rounds of DuoNeb treatments, magnesium, steroids.  She was also hypertensive on arrival, states that she had been taking her blood pressure medicines.  She was given 10 mg of hydralazine with improvement.  Given persistent asthma exacerbation, consulted hospitalist team for admission.  Patient is agreeable to this.   Dispostion:  After consideration of the diagnostic results and the patients response to treatment, I feel that the patent would benefit from admission for further treatment of persistent asthma exacerbation  This was a shared visit with my supervising physician Dr. Eudelia Bunch who independently saw and evaluated the patient & provided guidance in evaluation/management/disposition ,in agreement  with care   Final Clinical Impression(s) / ED Diagnoses Final diagnoses:  Moderate persistent asthma with acute exacerbation    Rx / DC Orders ED Discharge Orders     None         Leone Brand 10/03/21 0358    Nira Conn, MD 10/04/21 2023

## 2021-10-03 ENCOUNTER — Observation Stay (HOSPITAL_BASED_OUTPATIENT_CLINIC_OR_DEPARTMENT_OTHER): Payer: 59

## 2021-10-03 DIAGNOSIS — I509 Heart failure, unspecified: Secondary | ICD-10-CM | POA: Diagnosis not present

## 2021-10-03 DIAGNOSIS — J4541 Moderate persistent asthma with (acute) exacerbation: Secondary | ICD-10-CM | POA: Diagnosis not present

## 2021-10-03 DIAGNOSIS — I160 Hypertensive urgency: Secondary | ICD-10-CM | POA: Diagnosis not present

## 2021-10-03 DIAGNOSIS — E119 Type 2 diabetes mellitus without complications: Secondary | ICD-10-CM

## 2021-10-03 DIAGNOSIS — I110 Hypertensive heart disease with heart failure: Secondary | ICD-10-CM | POA: Diagnosis not present

## 2021-10-03 DIAGNOSIS — J45909 Unspecified asthma, uncomplicated: Secondary | ICD-10-CM | POA: Diagnosis present

## 2021-10-03 DIAGNOSIS — R079 Chest pain, unspecified: Secondary | ICD-10-CM

## 2021-10-03 DIAGNOSIS — E876 Hypokalemia: Secondary | ICD-10-CM

## 2021-10-03 DIAGNOSIS — J45901 Unspecified asthma with (acute) exacerbation: Secondary | ICD-10-CM | POA: Diagnosis present

## 2021-10-03 DIAGNOSIS — R519 Headache, unspecified: Secondary | ICD-10-CM

## 2021-10-03 DIAGNOSIS — J4521 Mild intermittent asthma with (acute) exacerbation: Secondary | ICD-10-CM | POA: Diagnosis not present

## 2021-10-03 DIAGNOSIS — F32A Depression, unspecified: Secondary | ICD-10-CM

## 2021-10-03 DIAGNOSIS — Z20822 Contact with and (suspected) exposure to covid-19: Secondary | ICD-10-CM | POA: Diagnosis not present

## 2021-10-03 HISTORY — DX: Hypertensive urgency: I16.0

## 2021-10-03 LAB — MRSA NEXT GEN BY PCR, NASAL: MRSA by PCR Next Gen: NOT DETECTED

## 2021-10-03 LAB — LIPID PANEL
Cholesterol: 245 mg/dL — ABNORMAL HIGH (ref 0–200)
HDL: 82 mg/dL (ref >40)
LDL Cholesterol: 156 mg/dL — ABNORMAL HIGH (ref 0–99)
Total CHOL/HDL Ratio: 3 RATIO
Triglycerides: 34 mg/dL (ref <150)
VLDL: 7 mg/dL (ref 0–40)

## 2021-10-03 LAB — HIV ANTIBODY (ROUTINE TESTING W REFLEX): HIV Screen 4th Generation wRfx: NONREACTIVE

## 2021-10-03 LAB — HEMOGLOBIN A1C
Hgb A1c MFr Bld: 6.6 % — ABNORMAL HIGH (ref 4.8–5.6)
Mean Plasma Glucose: 142.72 mg/dL

## 2021-10-03 LAB — RESP PANEL BY RT-PCR (FLU A&B, COVID) ARPGX2
Influenza A by PCR: NEGATIVE
Influenza B by PCR: NEGATIVE
SARS Coronavirus 2 by RT PCR: NEGATIVE

## 2021-10-03 LAB — TROPONIN I (HIGH SENSITIVITY)
Troponin I (High Sensitivity): 5 ng/L (ref <18)
Troponin I (High Sensitivity): 5 ng/L (ref <18)

## 2021-10-03 LAB — GLUCOSE, CAPILLARY
Glucose-Capillary: 223 mg/dL — ABNORMAL HIGH (ref 70–99)
Glucose-Capillary: 166 mg/dL — ABNORMAL HIGH (ref 70–99)
Glucose-Capillary: 159 mg/dL — ABNORMAL HIGH (ref 70–99)
Glucose-Capillary: 184 mg/dL — ABNORMAL HIGH (ref 70–99)

## 2021-10-03 LAB — D-DIMER, QUANTITATIVE: D-Dimer, Quant: 0.37 ug/mL-FEU (ref 0.00–0.50)

## 2021-10-03 LAB — ECHOCARDIOGRAM COMPLETE
Area-P 1/2: 3.77 cm2
Height: 65 in
S' Lateral: 2.1 cm
Weight: 4578.51 oz

## 2021-10-03 MED ORDER — ONDANSETRON HCL 4 MG PO TABS
4.0000 mg | ORAL_TABLET | Freq: Four times a day (QID) | ORAL | Status: DC | PRN
  Administered 2021-10-04: 4 mg via ORAL
  Filled 2021-10-03: qty 1

## 2021-10-03 MED ORDER — INSULIN ASPART 100 UNIT/ML IJ SOLN
0.0000 [IU] | Freq: Three times a day (TID) | INTRAMUSCULAR | Status: DC
  Administered 2021-10-03: 3 [IU] via SUBCUTANEOUS
  Administered 2021-10-04: 2 [IU] via SUBCUTANEOUS

## 2021-10-03 MED ORDER — ACETAMINOPHEN 650 MG RE SUPP: 650.0000 mg | Freq: Four times a day (QID) | RECTAL | Status: DC | PRN

## 2021-10-03 MED ORDER — BUPROPION HCL ER (XL) 300 MG PO TB24
450.0000 mg | ORAL_TABLET | Freq: Every morning | ORAL | Status: DC
  Administered 2021-10-03 – 2021-10-04 (×2): 450 mg via ORAL
  Filled 2021-10-03 (×2): qty 1

## 2021-10-03 MED ORDER — CALCIUM CARBONATE ANTACID 500 MG PO CHEW: 2.0000 | CHEWABLE_TABLET | Freq: Every day | ORAL | Status: DC | PRN

## 2021-10-03 MED ORDER — ONDANSETRON HCL 4 MG/2ML IJ SOLN
4.0000 mg | Freq: Four times a day (QID) | INTRAMUSCULAR | Status: DC | PRN
  Administered 2021-10-03: 4 mg via INTRAVENOUS
  Filled 2021-10-03: qty 2

## 2021-10-03 MED ORDER — ALLOPURINOL 100 MG PO TABS
100.0000 mg | ORAL_TABLET | Freq: Every day | ORAL | Status: DC
  Administered 2021-10-03 – 2021-10-04 (×2): 100 mg via ORAL
  Filled 2021-10-03 (×2): qty 1

## 2021-10-03 MED ORDER — TEMAZEPAM 7.5 MG PO CAPS: 30.0000 mg | ORAL_CAPSULE | Freq: Every evening | ORAL | Status: DC | PRN

## 2021-10-03 MED ORDER — SODIUM CHLORIDE 0.9% FLUSH
3.0000 mL | Freq: Two times a day (BID) | INTRAVENOUS | Status: DC
  Administered 2021-10-03 – 2021-10-04 (×3): 3 mL via INTRAVENOUS

## 2021-10-03 MED ORDER — IPRATROPIUM-ALBUTEROL 0.5-2.5 (3) MG/3ML IN SOLN
3.0000 mL | RESPIRATORY_TRACT | Status: DC | PRN
  Administered 2021-10-03: 3 mL via RESPIRATORY_TRACT
  Filled 2021-10-03: qty 3

## 2021-10-03 MED ORDER — LOSARTAN POTASSIUM 50 MG PO TABS
50.0000 mg | ORAL_TABLET | Freq: Every day | ORAL | Status: DC
  Administered 2021-10-03 – 2021-10-04 (×2): 50 mg via ORAL
  Filled 2021-10-03 (×2): qty 1

## 2021-10-03 MED ORDER — MAGNESIUM SULFATE 2 GM/50ML IV SOLN
2.0000 g | Freq: Once | INTRAVENOUS | Status: AC
  Administered 2021-10-03: 2 g via INTRAVENOUS
  Filled 2021-10-03: qty 50

## 2021-10-03 MED ORDER — IPRATROPIUM-ALBUTEROL 0.5-2.5 (3) MG/3ML IN SOLN
3.0000 mL | Freq: Four times a day (QID) | RESPIRATORY_TRACT | Status: DC
  Administered 2021-10-03 (×2): 3 mL via RESPIRATORY_TRACT
  Filled 2021-10-03 (×2): qty 3

## 2021-10-03 MED ORDER — HYDRALAZINE HCL 20 MG/ML IJ SOLN
10.0000 mg | INTRAMUSCULAR | Status: DC | PRN
  Administered 2021-10-03 (×2): 10 mg via INTRAVENOUS
  Filled 2021-10-03 (×2): qty 1

## 2021-10-03 MED ORDER — ENOXAPARIN SODIUM 40 MG/0.4ML IJ SOSY
40.0000 mg | PREFILLED_SYRINGE | INTRAMUSCULAR | Status: DC
  Administered 2021-10-03: 40 mg via SUBCUTANEOUS
  Filled 2021-10-03: qty 0.4

## 2021-10-03 MED ORDER — PREDNISONE 20 MG PO TABS
40.0000 mg | ORAL_TABLET | Freq: Every day | ORAL | Status: DC
  Administered 2021-10-03 – 2021-10-04 (×2): 40 mg via ORAL
  Filled 2021-10-03 (×3): qty 2

## 2021-10-03 MED ORDER — CLONIDINE HCL 0.1 MG PO TABS
0.1000 mg | ORAL_TABLET | Freq: Two times a day (BID) | ORAL | Status: DC
Start: 2021-10-03 — End: 2021-10-04
  Administered 2021-10-03 – 2021-10-04 (×2): 0.1 mg via ORAL
  Filled 2021-10-03 (×2): qty 1

## 2021-10-03 MED ORDER — ACETAMINOPHEN 325 MG PO TABS
650.0000 mg | ORAL_TABLET | Freq: Four times a day (QID) | ORAL | Status: DC | PRN
  Administered 2021-10-03 – 2021-10-04 (×2): 650 mg via ORAL
  Filled 2021-10-03 (×3): qty 2

## 2021-10-03 MED ORDER — METOPROLOL TARTRATE 25 MG PO TABS
75.0000 mg | ORAL_TABLET | Freq: Two times a day (BID) | ORAL | Status: DC
  Administered 2021-10-03 – 2021-10-04 (×3): 75 mg via ORAL
  Filled 2021-10-03 (×3): qty 3

## 2021-10-03 MED ORDER — ALBUTEROL SULFATE (2.5 MG/3ML) 0.083% IN NEBU
2.5000 mg | INHALATION_SOLUTION | Freq: Four times a day (QID) | RESPIRATORY_TRACT | Status: DC
  Administered 2021-10-03 – 2021-10-04 (×3): 2.5 mg via RESPIRATORY_TRACT
  Filled 2021-10-03 (×4): qty 3

## 2021-10-03 MED ORDER — DICLOFENAC SODIUM 1 % EX GEL: 4.0000 g | Freq: Four times a day (QID) | CUTANEOUS | Status: DC | PRN

## 2021-10-03 MED ORDER — CLONIDINE HCL 0.1 MG PO TABS: 0.1000 mg | ORAL_TABLET | Freq: Every day | ORAL | Status: DC

## 2021-10-03 MED ORDER — ALBUTEROL SULFATE (2.5 MG/3ML) 0.083% IN NEBU: 2.5000 mg | INHALATION_SOLUTION | RESPIRATORY_TRACT | Status: DC | PRN

## 2021-10-03 NOTE — H&P (Addendum)
History and Physical    Patient: Courtney Clayton SNK:[id number] DOB: 18-Jul-1970 DOA: 10/02/2021 DOS: the patient was seen and examined on 10/03/2021 PCP: Soundra Pilon, FNP  Patient coming from: Home  Chief Complaint:  Chief Complaint  Patient presents with   Chest Pain   Shortness of Breath   HPI: Courtney Clayton is a 51 y.o. female with medical history significant of hypertension, CHF, MI, diabetes mellitus type 2, gout, and depression who presents with complaints of chest pain and shortness of breath over the last week.  She reports having centralized chest pressure with radiation to her back.  Shortness of breath worsened with laying flat and reports some relief with sitting up.  She has had this intermittent cough with wheezing, headache, nausea, and a couple episodes of vomiting.  Denies having any significant fever, chills, abdominal pain, or recent sick contacts.  She last had an asthma exacerbation requiring hospitalization and 2019. Patient has been using her albuterol inhaler approximately 3 times a day with temporary relief in symptoms.  Patient notes that she is not had health insurance and ran out of her medications of clonidine which she has not seen her primary.  She was unaware of having [employer] health plan.  She notes a significant family history of heart disease reporting her mother having congestive heart failure and pacemaker as well as brain aneurysm dying at the age of 84.  She had two maternal aunts who died under the age of 71 from aneurysm and the other from heart attack.  On admission into the emergency department patient was noted to be afebrile with pulse 87-75, respiration 14-24, blood pressure was elevated up to 212/114, and O2 saturations maintained on room air.  Labs significant for potassium 3.4, BNP 63.2, and high-sensitivity troponin 5.  Chest x-rays did not note any acute abnormality.  Patient has been given DuoNeb breathing treatments, hydralazine 10 mg IV,  magnesium sulfate 2 g IV, and Decadron 2 mg IV.  Review of Systems: As mentioned in the history of present illness. All other systems reviewed and are negative. Past Medical History:  Diagnosis Date   Asthma    CHF (congestive heart failure) (HCC)    Depression    Diabetes mellitus without complication (HCC)    Hypertension    MI, old    Past Surgical History:  Procedure Laterality Date   ABDOMINAL HYSTERECTOMY     BACK SURGERY     GANGLION CYST EXCISION     HERNIA REPAIR     TONSILLECTOMY     Social History:  reports that she has never smoked. She has never used smokeless tobacco. She reports current alcohol use. She reports that she does not use drugs.  Allergies  Allergen Reactions   Sulfa Antibiotics Hives    History reviewed. No pertinent family history.  Prior to Admission medications   Medication Sig Start Date End Date Taking? Authorizing Provider  acetaminophen (TYLENOL) 500 MG tablet Take 1,000 mg by mouth every 6 (six) hours as needed for moderate pain or headache.   Yes [provider]  allopurinol (ZYLOPRIM) 100 MG tablet Take 100 mg by mouth daily. 07/12/21  Yes [provider]  buPROPion (WELLBUTRIN XL) 150 MG 24 hr tablet Take 450 mg by mouth every morning. 07/12/21  Yes [provider]  calcium carbonate (TUMS - DOSED IN MG ELEMENTAL CALCIUM) 500 MG chewable tablet Chew 2 tablets by mouth daily as needed for indigestion or heartburn.   Yes [provider]  diclofenac Sodium (VOLTAREN) 1 % GEL Apply 4 g topically 4 (four) times daily. Patient taking differently: Apply 4 g topically 4 (four) times daily as needed (knee pain). 07/16/20  Yes Joy, Shawn C, PA-C  losartan (COZAAR) 50 MG tablet Take 50 mg by mouth daily. 02/11/19  Yes [provider]  metFORMIN (GLUCOPHAGE) 500 MG tablet Take 1,000 mg by mouth 2 (two) times daily with a meal. 03/20/19  Yes [provider]  Metoprolol Tartrate 75 MG TABS Take 75 tablets by  mouth 2 (two) times daily. 03/15/19  Yes [provider]  pioglitazone (ACTOS) 15 MG tablet Take 15 mg by mouth daily. 07/12/21  Yes [provider]  simvastatin (ZOCOR) 20 MG tablet Take 20 mg by mouth at bedtime. 02/11/19  Yes [provider]  temazepam (RESTORIL) 30 MG capsule Take 30 mg by mouth at bedtime as needed for sleep.  02/11/19  Yes [provider]  cloNIDine (CATAPRES) 0.1 MG tablet Take 1 tablet (0.1 mg total) by mouth daily. Patient not taking: Reported on 10/03/2021 04/17/21 05/17/21  Henderly, Britni A, PA-C  famotidine (PEPCID) 20 MG tablet Take 1 tablet (20 mg total) by mouth 2 (two) times daily. Patient not taking: Reported on 07/10/2019 04/01/19   Muthersbaugh, Dahlia ClientHannah, PA-C  hydrocortisone 2.5 % lotion Apply topically 2 (two) times daily. Patient not taking: Reported on 04/15/2019 04/01/19   Muthersbaugh, Dahlia ClientHannah, PA-C  lidocaine (LIDODERM) 5 % Place 1 patch onto the skin daily. Remove & Discard patch within 12 hours or as directed by MD Patient not taking: Reported on 10/03/2021 07/05/21   Blue, Soijett A, PA-C  methocarbamol (ROBAXIN) 500 MG tablet Take 1 tablet (500 mg total) by mouth 2 (two) times daily. Patient not taking: Reported on 10/03/2021 07/05/21   Blue, Soijett A, PA-C    Physical Exam: Vitals:   10/03/21 0500 10/03/21 0600 10/03/21 0628 10/03/21 0656  BP: (!) 141/80 (!) 150/87  (!) 168/100  Pulse: 64 63  75  Resp: 20 (!) 21  20  Temp:   98.6 F (37 C) 98.1 F (36.7 C)  TempSrc:   Oral Oral  SpO2: 98% 98%  96%  Weight:    129.6 kg  Height:    5\' 5"  (1.651 m)   Exam  Constitutional: Middle-aged female who appears to be in some distress Eyes: PERRL, lids and conjunctivae normal ENMT: Mucous membranes are moist. Posterior pharynx clear of any exudate or lesions.  Neck: normal, supple, no JVD Respiratory: Decreased overall aeration with intermittent wheezes appreciated.  O2 saturation current maintained on room  air. Cardiovascular: Regular rate and rhythm, no murmurs / rubs / gallops.  Trace lower extremity edema.  Abdomen: no tenderness, no masses palpated.  Bowel sounds positive.  Musculoskeletal: no clubbing / cyanosis. No joint deformity upper and lower extremities. Good ROM, no contractures. Normal muscle tone.  Skin: no rashes, lesions, ulcers. No induration Neurologic: CN 2-12 grossly intact. Strength 5/5 in all 4.  Psychiatric: Normal judgment and insight. Alert and oriented x 3. Normal mood.   Data Reviewed:  EKG reveals sinus rhythm at 59 bpm with nonspecific T wave abnormality appreciated.  Repeat check this morning noted heart rate of 83 bpm without significant abnormality appreciated.  Otherwise reviewed patient's labs and imaging as noted above in HPI  Assessment and Plan: Mild intermittent asthma with exacerbation Patient presented with complaints of shortness of breath with wheezing and chest pain.  Initially thought chest pressure was secondary to  asthma.  She has been using inhalers 3 times a day with only temporary relief in symptoms.  Last asthma exacerbation requiring hospitalization in 2019. -Admit to a medical telemetry bed -Monitor O2 saturations and provide nasal cannula oxygen if needed to maintain O2 saturations greater than 92% -Check pulse oximetry with ambulation -Albuterol breathing scheduled and as needed -Prednisone 40 mg daily  Chest pain Patient reports substernal chest pressure with radiation to her back.  Noted associated symptoms of nausea and vomiting.  Repeat EKG without significant ST changes and high-sensitivity troponins negative x2.  Patient has significant family history of heart disease.  D-dimer was negative at 0.37. -Check lipid panel -Check echocardiogram -Formally consult cardiology if echocardiogram abnormal.  Hypertensive urgency On admission blood pressures noted to be elevated up to 212/114.  Home blood pressure regimen includes losartan 50  mg daily and metoprolol 75 mg twice daily.  She reports being without clonidine for the last month, but previously.  Only to take it once daily. -Continue beta-blocker and losartan -Start clonidine 0.1 mg twice daily -Hydralazine IV as needed for elevated blood pressures  Hypokalemia Acute.  Potassium 3.4 on admission. -Give potassium chloride 20 meq p.o. -Continue to monitor and replace as needed  Headache Patient reports having headache thought possibly secondary to patient's blood pressure being uncontrolled. -Trying to obtain better blood pressure control at this time -Tylenol as needed  Diabetes mellitus type 2, uncomplicated On admission glucose 115.  Home regimen includes metformin 1000 mg twice daily and Actos 15 mg daily. -Hypoglycemic protocols -Check hemoglobin A1c -Hold metformin and Actos -CBGs before every meal with moderate SSI  Depression Home blood pressure regimen includes Wellbutrin 450 mg daily -Continue home regimen  Morbid obesity BMI 47.62 kg/m2   Advance Care Planning:   Code Status: Full Code    Consults: None  Family Communication: Daughter updated at bedside  Severity of Illness: The appropriate patient status for this patient is OBSERVATION. Observation status is judged to be reasonable and necessary in order to provide the required intensity of service to ensure the patient's safety. The patient's presenting symptoms, physical exam findings, and initial radiographic and laboratory data in the context of their medical condition is felt to place them at decreased risk for further clinical deterioration. Furthermore, it is anticipated that the patient will be medically stable for discharge from the hospital within 2 midnights of admission.   Author: Clydie Braun, MD 10/03/2021 7:54 AM  For on call review [email or web address].

## 2021-10-03 NOTE — Progress Notes (Signed)
  Transition of Care [facility]) Screening Note   Patient Details  Name: Courtney Clayton Date of Birth: 1970/10/04   Transition of Care [facility]) CM/SW Contact:    Patrice Paradise, LCSW Phone Number: 10/03/2021, 3:34 PM    Transition of Care Department [facility]) has reviewed patient and no TOC needs have been identified at this time. We will continue to monitor patient advancement through interdisciplinary progression rounds. If new patient transition needs arise, please place a TOC consult.

## 2021-10-03 NOTE — Progress Notes (Signed)
  Echocardiogram 2D Echocardiogram has been performed.  Courtney Clayton F 10/03/2021, 5:24 PM

## 2021-10-03 NOTE — Plan of Care (Signed)
  Problem: Education: Goal: Knowledge of General Education information will improve Description Including pain rating scale, medication(s)/side effects and non-pharmacologic comfort measures Outcome: Progressing   

## 2021-10-03 NOTE — ED Notes (Signed)
2nd nebulizer treatment administered. Pt shortness of breath/coughing improved post 1st neb treatment, but symptoms have returned at this time.

## 2021-10-04 DIAGNOSIS — J4521 Mild intermittent asthma with (acute) exacerbation: Secondary | ICD-10-CM | POA: Diagnosis not present

## 2021-10-04 LAB — BASIC METABOLIC PANEL
Anion gap: 7 (ref 5–15)
BUN: 16 mg/dL (ref 6–20)
CO2: 25 mmol/L (ref 22–32)
Calcium: 9 mg/dL (ref 8.9–10.3)
Chloride: 107 mmol/L (ref 98–111)
Creatinine, Ser: 1.05 mg/dL — ABNORMAL HIGH (ref 0.44–1.00)
GFR, Estimated: >60 mL/min (ref >60)
Glucose, Bld: 118 mg/dL — ABNORMAL HIGH (ref 70–99)
Potassium: 3.9 mmol/L (ref 3.5–5.1)
Sodium: 139 mmol/L (ref 135–145)

## 2021-10-04 LAB — CBC
HCT: 36.9 % (ref 36.0–46.0)
Hemoglobin: 11.7 g/dL — ABNORMAL LOW (ref 12.0–15.0)
MCH: 27 pg (ref 26.0–34.0)
MCHC: 31.7 g/dL (ref 30.0–36.0)
MCV: 85 fL (ref 80.0–100.0)
Platelets: 270 10*3/uL (ref 150–400)
RBC: 4.34 MIL/uL (ref 3.87–5.11)
RDW: 14.2 % (ref 11.5–15.5)
WBC: 12.7 10*3/uL — ABNORMAL HIGH (ref 4.0–10.5)
nRBC: 0 % (ref 0.0–0.2)

## 2021-10-04 LAB — GLUCOSE, CAPILLARY: Glucose-Capillary: 142 mg/dL — ABNORMAL HIGH (ref 70–99)

## 2021-10-04 MED ORDER — PREDNISONE 20 MG PO TABS: 40.0000 mg | ORAL_TABLET | Freq: Every day | ORAL | 0 refills | Status: DC

## 2021-10-04 MED ORDER — IPRATROPIUM-ALBUTEROL 0.5-2.5 (3) MG/3ML IN SOLN: 3.0000 mL | RESPIRATORY_TRACT | 1 refills | Status: DC | PRN

## 2021-10-04 MED ORDER — CLONIDINE HCL 0.1 MG PO TABS: 0.1000 mg | ORAL_TABLET | Freq: Two times a day (BID) | ORAL | 1 refills | Status: DC

## 2021-10-04 MED ORDER — PEAK FLOW METER DEVI: 1.0000 | 0 refills | Status: DC | PRN

## 2021-10-04 MED ORDER — ALBUTEROL SULFATE (2.5 MG/3ML) 0.083% IN NEBU: 2.5000 mg | INHALATION_SOLUTION | RESPIRATORY_TRACT | 12 refills | Status: DC | PRN

## 2021-10-04 NOTE — Plan of Care (Signed)

## 2021-10-04 NOTE — Discharge Summary (Signed)
Physician Discharge Summary  Courtney Clayton [id number] DOB: [birth year] DOA: 10/02/2021  PCP: Kristen Loader, FNP  Admit date: 10/02/2021 Discharge date: 10/04/2021  Time spent: 36 minutes  Recommendations for Outpatient Follow-up:  Patient requires Chem-12 CBC in about 1 week Patient requires chest x-ray in about 3 to 4 weeks if continues to have symptoms Recommend outpatient consideration for stress testing given family history--echocardiogram this admission was negative Does require weight loss counseling  Discharge Diagnoses:  MAIN problem for hospitalization acute asthma exacerbation  Please see below for itemized issues addressed in [place]- refer to other progress notes for clarity if needed  Discharge Condition: Improved  Diet recommendation: Diabetic heart healthy  Filed Weights   10/03/21 0656 10/03/21 0907  Weight: 129.6 kg 129.8 kg    History of present illness:  51 year old black female class IV obesity with restrictive lung disease in addition to underlying asthma diabetes mellitus hypertension depression Was admitted 5/27 with shortness of breath in the setting of prior asthma exacerbation hospitalization 2019 --she has family history of mother having heart attack and aneurysm at age 77 etc. She came into the hospital with hypertensive urgency of 200 BNP of 63 and was wheezing She was given DuoNebs hydralazine magnesium Decadron  Hospital Course:  Mild intermittent asthma Completely resolved at time of discharge Have given her a taper of prednisone for several days in addition to refills on her nebulizations Chest pain noncardiogenic Because patient describes a heaviness in the chest-we got an echo which showed no wall motion abnormalities sensitivity troponins were negative X2 Because of her underlying history she may benefit from getting a stress echo in the outpatient setting DM TY 2 Patient was placed on her usual home meds and these were  resumed Hypertensive urgency on admission Much improved patient was given prescription for meds and these were all resumed on discharge We did provide her with a prescription of clonidine at time of discharge and she will continue to use the same  Discharge Exam: Vitals:   10/04/21 0300 10/04/21 0717  BP:  (!) 154/94  Pulse:  63  Resp: 17 16  Temp:  98.9 F (37.2 C)  SpO2:  98%    Subj on day of d/c   Still mild pain in the chest but reproducible no concerning features  General Exam on discharge  Obese pleasant no distress EOMI NCAT no focal deficit CTA B no added sound rales rhonchi Obese abdomen cannot appreciate HSM No lower extremity edema ROM intact   Discharge Instructions   Discharge Instructions     Diet - low sodium heart healthy   Complete by: As directed    Discharge instructions   Complete by: As directed    Make sure that you follow-up with your primary care physician in about 1 week and try to set up for may be an outpatient stress test to rule out issues Would recommend close coordination of care with PCP for weight loss counseling management and nutritionist referral You will need labs in a week Please complete your prednisone taper that we have prescribed for you Please use your peak flow meter if you have further issues and if you are in the red zone more than once or twice you may need to be seen Take care of yourself and have a nice Memorial Day   Increase activity slowly   Complete by: As directed       Allergies as of 10/04/2021       Reactions  Sulfa Antibiotics Hives        Medication List     TAKE these medications    acetaminophen 500 MG tablet Commonly known as: TYLENOL Take 1,000 mg by mouth every 6 (six) hours as needed for moderate pain or headache.   albuterol (2.5 MG/3ML) 0.083% nebulizer solution Commonly known as: PROVENTIL Take 3 mLs (2.5 mg total) by nebulization every 2 (two) hours as needed for wheezing.    allopurinol 100 MG tablet Commonly known as: ZYLOPRIM Take 100 mg by mouth daily.   buPROPion 150 MG 24 hr tablet Commonly known as: WELLBUTRIN XL Take 450 mg by mouth every morning.   calcium carbonate 500 MG chewable tablet Commonly known as: TUMS - dosed in mg elemental calcium Chew 2 tablets by mouth daily as needed for indigestion or heartburn.   cloNIDine 0.1 MG tablet Commonly known as: CATAPRES Take 1 tablet (0.1 mg total) by mouth daily.   diclofenac Sodium 1 % Gel Commonly known as: VOLTAREN Apply 4 g topically 4 (four) times daily. What changed:  when to take this reasons to take this   famotidine 20 MG tablet Commonly known as: Pepcid Take 1 tablet (20 mg total) by mouth 2 (two) times daily.   hydrocortisone 2.5 % lotion Apply topically 2 (two) times daily.   ipratropium-albuterol 0.5-2.5 (3) MG/3ML Soln Commonly known as: DUONEB Take 3 mLs by nebulization every 5 (five) minutes x 3 doses as needed.   lidocaine 5 % Commonly known as: Lidoderm Place 1 patch onto the skin daily. Remove & Discard patch within 12 hours or as directed by MD   losartan 50 MG tablet Commonly known as: COZAAR Take 50 mg by mouth daily.   metFORMIN 500 MG tablet Commonly known as: GLUCOPHAGE Take 1,000 mg by mouth 2 (two) times daily with a meal.   methocarbamol 500 MG tablet Commonly known as: ROBAXIN Take 1 tablet (500 mg total) by mouth 2 (two) times daily.   Metoprolol Tartrate 75 MG Tabs Take 75 tablets by mouth 2 (two) times daily.   Peak Flow Meter Devi 1 Device by Does not apply route as needed.   pioglitazone 15 MG tablet Commonly known as: ACTOS Take 15 mg by mouth daily.   predniSONE 20 MG tablet Commonly known as: DELTASONE Take 2 tablets (40 mg total) by mouth daily with breakfast. Start taking on: Oct 05, 2021   simvastatin 20 MG tablet Commonly known as: ZOCOR Take 20 mg by mouth at bedtime.   temazepam 30 MG capsule Commonly known as:  RESTORIL Take 30 mg by mouth at bedtime as needed for sleep.       Allergies  Allergen Reactions   Sulfa Antibiotics Hives      The results of significant diagnostics from this hospitalization (including imaging, microbiology, ancillary and laboratory) are listed below for reference.    Significant Diagnostic Studies: DG Chest 2 View  Result Date: 10/02/2021 CLINICAL DATA:  Chest pain, shortness of breath. EXAM: CHEST - 2 VIEW COMPARISON:  03/02/2021. FINDINGS: The heart size and mediastinal contours are within normal limits. No consolidation, effusion, or pneumothorax. Degenerative changes are noted in the thoracic spine. IMPRESSION: No active cardiopulmonary disease. Electronically Signed   By: Brett Fairy M.D.   On: 10/02/2021 22:46   ECHOCARDIOGRAM COMPLETE  Result Date: 10/03/2021    ECHOCARDIOGRAM REPORT   Patient Name:   Courtney Clayton Date of Exam: 10/03/2021 Medical Rec #:  [id number]  Height:       65.0 in Accession #:    [id number]       Weight:       286.2 lb Date of Birth:  11-23-1970       BSA:          2.303 m Patient Age:    44 years         BP:           138/93 mmHg Patient Gender: F                HR:           80 bpm. Exam Location:  Inpatient Procedure: 2D Echo, Cardiac Doppler, Color Doppler and Strain Analysis Indications:    Chest pain  History:        Patient has no prior history of Echocardiogram examinations.                 Signs/Symptoms:Chest Pain.  Sonographer:    Merrie Roof RDCS Referring Phys: [id number] [place]  1. Left ventricular ejection fraction, by estimation, is >75%. The left ventricle has hyperdynamic function. The left ventricle has no regional wall motion abnormalities. There is moderate left ventricular hypertrophy. Left ventricular diastolic parameters are consistent with Grade I diastolic dysfunction (impaired relaxation).  2. Right ventricular systolic function is normal. The right ventricular size is normal. Tricuspid  regurgitation signal is inadequate for assessing PA pressure.  3. The mitral valve is grossly normal. Trivial mitral valve regurgitation.  4. The aortic valve is tricuspid. Aortic valve regurgitation is not visualized.  5. The inferior vena cava is normal in size with greater than 50% respiratory variability, suggesting right atrial pressure of 3 mmHg.  6. Cannot exclude a small PFO. Comparison(s): No prior Echocardiogram. FINDINGS  Left Ventricle: Left ventricular ejection fraction, by estimation, is >75%. The left ventricle has hyperdynamic function. The left ventricle has no regional wall motion abnormalities. The left ventricular internal cavity size was normal in size. There is moderate left ventricular hypertrophy. Left ventricular diastolic parameters are consistent with Grade I diastolic dysfunction (impaired relaxation). Indeterminate filling pressures. Right Ventricle: The right ventricular size is normal. No increase in right ventricular wall thickness. Right ventricular systolic function is normal. Tricuspid regurgitation signal is inadequate for assessing PA pressure. Left Atrium: Left atrial size was normal in size. Right Atrium: Right atrial size was normal in size. Pericardium: There is no evidence of pericardial effusion. Mitral Valve: The mitral valve is grossly normal. Trivial mitral valve regurgitation. Tricuspid Valve: The tricuspid valve is grossly normal. Tricuspid valve regurgitation is trivial. Aortic Valve: The aortic valve is tricuspid. Aortic valve regurgitation is not visualized. Pulmonic Valve: The pulmonic valve was normal in structure. Pulmonic valve regurgitation is not visualized. Aorta: The aortic root and ascending aorta are structurally normal, with no evidence of dilitation. Venous: The inferior vena cava is normal in size with greater than 50% respiratory variability, suggesting right atrial pressure of 3 mmHg. IAS/Shunts: Cannot exclude a small PFO.  LEFT VENTRICLE PLAX 2D  LVIDd:         4.30 cm   Diastology LVIDs:         2.10 cm   LV e' medial:    7.93 cm/s LV PW:         1.40 cm   LV E/e' medial:  12.5 LV IVS:        1.70 cm   LV e' lateral:   9.79 cm/s  LVOT diam:     1.90 cm   LV E/e' lateral: 10.2 LV SV:         103 LV SV Index:   45 LVOT Area:     2.84 cm  RIGHT VENTRICLE RV Basal diam:  3.20 cm LEFT ATRIUM              Index        RIGHT ATRIUM           Index LA diam:        4.40 cm  1.91 cm/m   RA Area:     15.00 cm LA Vol (A2C):   111.0 ml 48.19 ml/m  RA Volume:   36.90 ml  16.02 ml/m LA Vol (A4C):   74.8 ml  32.47 ml/m LA Biplane Vol: 94.4 ml  40.98 ml/m  AORTIC VALVE LVOT Vmax:   178.00 cm/s LVOT Vmean:  117.000 cm/s LVOT VTI:    0.363 m  AORTA Ao Root diam: 2.90 cm Ao Asc diam:  2.90 cm MITRAL VALVE MV Area (PHT): 3.77 cm     SHUNTS MV Decel Time: 201 msec     Systemic VTI:  0.36 m MV E velocity: 99.40 cm/s   Systemic Diam: 1.90 cm MV A velocity: 111.00 cm/s MV E/A ratio:  0.90 Lyman Bishop MD Electronically signed by Lyman Bishop MD Signature Date/Time: 10/03/2021/8:08:28 PM    Final     Microbiology: Recent Results (from the past 240 hour(s))  Resp Panel by RT-PCR (Flu A&B, Covid) Anterior Nasal Swab     Status: None   Collection Time: 10/02/21 11:30 PM   Specimen: Anterior Nasal Swab  Result Value Ref Range Status   SARS Coronavirus 2 by RT PCR NEGATIVE NEGATIVE Final    Comment: (NOTE) SARS-CoV-2 target nucleic acids are NOT DETECTED.  The SARS-CoV-2 RNA is generally detectable in upper respiratory specimens during the acute phase of infection. The lowest concentration of SARS-CoV-2 viral copies this assay can detect is 138 copies/mL. A negative result does not preclude SARS-Cov-2 infection and should not be used as the sole basis for treatment or other patient management decisions. A negative result may occur with  improper specimen collection/handling, submission of specimen other than nasopharyngeal swab, presence of viral mutation(s)  within the areas targeted by this assay, and inadequate number of viral copies(<138 copies/mL). A negative result must be combined with clinical observations, patient history, and epidemiological information. The expected result is Negative.  Fact Sheet for Patients:  EntrepreneurPulse.com.au  Fact Sheet for Healthcare Providers:  IncredibleEmployment.be  This test is no t yet approved or cleared by the [place] FDA and  has been authorized for detection and/or diagnosis of SARS-CoV-2 by FDA under an Emergency Use Authorization (EUA). This EUA will remain  in effect (meaning this test can be used) for the duration of the COVID-19 declaration under Section 564(b)(1) of the Act, 21 U.S.C.section 360bbb-3(b)(1), unless the authorization is terminated  or revoked sooner.       Influenza A by PCR NEGATIVE NEGATIVE Final   Influenza B by PCR NEGATIVE NEGATIVE Final    Comment: (NOTE) The Xpert Xpress SARS-CoV-2/FLU/RSV plus assay is intended as an aid in the diagnosis of influenza from Nasopharyngeal swab specimens and should not be used as a sole basis for treatment. Nasal washings and aspirates are unacceptable for Xpert Xpress SARS-CoV-2/FLU/RSV testing.  Fact Sheet for Patients: EntrepreneurPulse.com.au  Fact Sheet for Healthcare Providers: IncredibleEmployment.be  This test is not yet approved  or cleared by the [place] and has been authorized for detection and/or diagnosis of SARS-CoV-2 by FDA under an Emergency Use Authorization (EUA). This EUA will remain in effect (meaning this test can be used) for the duration of the COVID-19 declaration under Section 564(b)(1) of the Act, 21 U.S.C. section 360bbb-3(b)(1), unless the authorization is terminated or revoked.  Performed at [facility], [place] [address] [place], [place] [address]   MRSA Next Gen by PCR, Nasal     Status: None    Collection Time: 10/03/21  7:17 AM   Specimen: Nasal Mucosa; Nasal Swab  Result Value Ref Range Status   MRSA by PCR Next Gen NOT DETECTED NOT DETECTED Final    Comment: (NOTE) The GeneXpert MRSA Assay (FDA approved for NASAL specimens only), is one component of a comprehensive MRSA colonization surveillance program. It is not intended to diagnose MRSA infection nor to guide or monitor treatment for MRSA infections. Test performance is not FDA approved in patients less than 43 years old. Performed at [facility], [place] [address], [place], [place] [address]      Labs: Basic Metabolic Panel: Recent Labs  Lab 10/02/21 2229 10/04/21 0101  NA 137 139  K 3.4* 3.9  CL 105 107  CO2 25 25  GLUCOSE 115* 118*  BUN 16 16  CREATININE 0.83 1.05*  CALCIUM 9.1 9.0   Liver Function Tests: No results for input(s): AST, ALT, ALKPHOS, BILITOT, PROT, ALBUMIN in the last 168 hours. No results for input(s): LIPASE, AMYLASE in the last 168 hours. No results for input(s): AMMONIA in the last 168 hours. CBC: Recent Labs  Lab 10/02/21 2229 10/04/21 0101  WBC 10.0 12.7*  HGB 12.4 11.7*  HCT 39.1 36.9  MCV 85.9 85.0  PLT 267 270   Cardiac Enzymes: No results for input(s): CKTOTAL, CKMB, CKMBINDEX, TROPONINI in the last 168 hours. BNP: BNP (last 3 results) Recent Labs    10/02/21 2229  BNP 63.2    ProBNP (last 3 results) No results for input(s): PROBNP in the last 8760 hours.  CBG: Recent Labs  Lab 10/03/21 0656 10/03/21 1141 10/03/21 1606 10/03/21 2140 10/04/21 0609  GLUCAP 223* 166* 159* 184* 142*       Signed:  [name] MD   [facility] 10/04/2021, 9:30 AM

## 2022-04-28 ENCOUNTER — Emergency Department (HOSPITAL_COMMUNITY)
Admission: EM | Admit: 2022-04-28 | Discharge: 2022-04-29 | Disposition: A | Discharge status: Home / Self Care | Payer: Self-pay | Attending: Emergency Medicine | Admitting: Emergency Medicine

## 2022-04-28 ENCOUNTER — Other Ambulatory Visit: Payer: Self-pay

## 2022-04-28 ENCOUNTER — Emergency Department (HOSPITAL_COMMUNITY): Payer: Self-pay

## 2022-04-28 ENCOUNTER — Encounter (HOSPITAL_COMMUNITY): Payer: Self-pay | Admitting: Emergency Medicine

## 2022-04-28 DIAGNOSIS — J101 Influenza due to other identified influenza virus with other respiratory manifestations: Secondary | ICD-10-CM | POA: Insufficient documentation

## 2022-04-28 DIAGNOSIS — Z1152 Encounter for screening for COVID-19: Secondary | ICD-10-CM | POA: Insufficient documentation

## 2022-04-28 DIAGNOSIS — Z79899 Other long term (current) drug therapy: Secondary | ICD-10-CM | POA: Insufficient documentation

## 2022-04-28 DIAGNOSIS — I509 Heart failure, unspecified: Secondary | ICD-10-CM | POA: Insufficient documentation

## 2022-04-28 DIAGNOSIS — Z7984 Long term (current) use of oral hypoglycemic drugs: Secondary | ICD-10-CM | POA: Insufficient documentation

## 2022-04-28 DIAGNOSIS — H6690 Otitis media, unspecified, unspecified ear: Secondary | ICD-10-CM

## 2022-04-28 DIAGNOSIS — I110 Hypertensive heart disease with heart failure: Secondary | ICD-10-CM | POA: Insufficient documentation

## 2022-04-28 DIAGNOSIS — E119 Type 2 diabetes mellitus without complications: Secondary | ICD-10-CM | POA: Insufficient documentation

## 2022-04-28 DIAGNOSIS — J111 Influenza due to unidentified influenza virus with other respiratory manifestations: Secondary | ICD-10-CM

## 2022-04-28 DIAGNOSIS — J45901 Unspecified asthma with (acute) exacerbation: Secondary | ICD-10-CM | POA: Insufficient documentation

## 2022-04-28 DIAGNOSIS — J45909 Unspecified asthma, uncomplicated: Secondary | ICD-10-CM | POA: Insufficient documentation

## 2022-04-28 DIAGNOSIS — H6693 Otitis media, unspecified, bilateral: Secondary | ICD-10-CM | POA: Insufficient documentation

## 2022-04-28 LAB — TROPONIN I (HIGH SENSITIVITY): Troponin I (High Sensitivity): 4 ng/L (ref <18)

## 2022-04-28 LAB — BASIC METABOLIC PANEL
Anion gap: 9 (ref 5–15)
BUN: 12 mg/dL (ref 6–20)
CO2: 28 mmol/L (ref 22–32)
Calcium: 9.3 mg/dL (ref 8.9–10.3)
Chloride: 104 mmol/L (ref 98–111)
Creatinine, Ser: 1.05 mg/dL — ABNORMAL HIGH (ref 0.44–1.00)
GFR, Estimated: >60 mL/min (ref >60)
Glucose, Bld: 128 mg/dL — ABNORMAL HIGH (ref 70–99)
Potassium: 4.2 mmol/L (ref 3.5–5.1)
Sodium: 141 mmol/L (ref 135–145)

## 2022-04-28 LAB — CBC
HCT: 41.7 % (ref 36.0–46.0)
Hemoglobin: 13.3 g/dL (ref 12.0–15.0)
MCH: 26.5 pg (ref 26.0–34.0)
MCHC: 31.9 g/dL (ref 30.0–36.0)
MCV: 83.1 fL (ref 80.0–100.0)
Platelets: 262 10*3/uL (ref 150–400)
RBC: 5.02 MIL/uL (ref 3.87–5.11)
RDW: 14.4 % (ref 11.5–15.5)
WBC: 9.3 10*3/uL (ref 4.0–10.5)
nRBC: 0 % (ref 0.0–0.2)

## 2022-04-28 LAB — GROUP A STREP BY PCR: Group A Strep by PCR: NOT DETECTED

## 2022-04-28 LAB — RESP PANEL BY RT-PCR (RSV, FLU A&B, COVID)  RVPGX2
Influenza A by PCR: POSITIVE — AB
Influenza B by PCR: NEGATIVE
Resp Syncytial Virus by PCR: NEGATIVE
SARS Coronavirus 2 by RT PCR: NEGATIVE

## 2022-04-28 MED ORDER — ALBUTEROL SULFATE HFA 108 (90 BASE) MCG/ACT IN AERS
1.0000 | INHALATION_SPRAY | Freq: Once | RESPIRATORY_TRACT | Status: AC
  Administered 2022-04-28: 1 via RESPIRATORY_TRACT
  Filled 2022-04-28: qty 6.7

## 2022-04-28 MED ORDER — METOPROLOL TARTRATE 25 MG PO TABS
75.0000 mg | ORAL_TABLET | Freq: Once | ORAL | Status: AC
  Administered 2022-04-28: 75 mg via ORAL
  Filled 2022-04-28: qty 3

## 2022-04-28 MED ORDER — LOSARTAN POTASSIUM 50 MG PO TABS
50.0000 mg | ORAL_TABLET | Freq: Once | ORAL | Status: AC
  Administered 2022-04-28: 50 mg via ORAL
  Filled 2022-04-28: qty 1

## 2022-04-28 NOTE — ED Triage Notes (Signed)
Pt endorses headache, sore throat and chest pains since last Thursday. Pt reports frequent coughing and has been using her home neb and inhaler tx. Pt has been taking OTC cold medications.

## 2022-04-28 NOTE — ED Provider Triage Note (Signed)
Emergency Medicine Provider Triage Evaluation Note  Courtney Clayton , a 51 y.o. female  was evaluated in triage.  Pt complains of chest pain, headache, shortness of breath and cough.  Feeling short of breath.  Been out of her albuterol inhaler.  Has not had her night medicine yet..  Review of Systems  Per HPI  Physical Exam  BP (!) 218/130   Pulse 88   Temp 99.1 F (37.3 C) (Oral)   Resp 20   Ht 5\' 5"  (1.651 m)   Wt 130 kg   SpO2 99%   BMI 47.69 kg/m  Gen:   Awake, no distress   Resp:  Normal effort  MSK:   Moves extremities without difficulty  Other:    Medical Decision Making  Medically screening exam initiated at 6:48 PM.  Appropriate orders placed.  was informed that the remainder of the evaluation will be completed by another provider, this initial triage assessment does not replace that evaluation, and the importance of remaining in the ED until their evaluation is complete.  Chest pain the context significant hypertension will check labs as well as viral panel and strep panel.  Night doses of HTN medicine ordered   [name], PA-C 04/28/22 1851

## 2022-04-29 LAB — TROPONIN I (HIGH SENSITIVITY): Troponin I (High Sensitivity): 4 ng/L (ref <18)

## 2022-04-29 MED ORDER — AMOXICILLIN-POT CLAVULANATE 875-125 MG PO TABS
1.0000 | ORAL_TABLET | Freq: Once | ORAL | Status: AC
  Administered 2022-04-29: 1 via ORAL
  Filled 2022-04-29: qty 1

## 2022-04-29 MED ORDER — PREDNISONE 20 MG PO TABS
60.0000 mg | ORAL_TABLET | Freq: Once | ORAL | Status: AC
  Administered 2022-04-29: 60 mg via ORAL
  Filled 2022-04-29: qty 3

## 2022-04-29 MED ORDER — GUAIFENESIN ER 600 MG PO TB12
600.0000 mg | ORAL_TABLET | Freq: Once | ORAL | Status: AC
  Administered 2022-04-29: 600 mg via ORAL
  Filled 2022-04-29: qty 1

## 2022-04-29 MED ORDER — LOSARTAN POTASSIUM 50 MG PO TABS
50.0000 mg | ORAL_TABLET | Freq: Once | ORAL | Status: AC
  Administered 2022-04-29: 50 mg via ORAL
  Filled 2022-04-29: qty 1

## 2022-04-29 MED ORDER — IPRATROPIUM-ALBUTEROL 0.5-2.5 (3) MG/3ML IN SOLN: 3.0000 mL | RESPIRATORY_TRACT | 0 refills | Status: DC | PRN

## 2022-04-29 MED ORDER — IPRATROPIUM-ALBUTEROL 0.5-2.5 (3) MG/3ML IN SOLN
3.0000 mL | Freq: Once | RESPIRATORY_TRACT | Status: AC
  Administered 2022-04-29: 3 mL via RESPIRATORY_TRACT
  Filled 2022-04-29: qty 3

## 2022-04-29 MED ORDER — AMOXICILLIN-POT CLAVULANATE 875-125 MG PO TABS: 1.0000 | ORAL_TABLET | Freq: Two times a day (BID) | ORAL | 0 refills | Status: DC

## 2022-04-29 MED ORDER — PREDNISONE 20 MG PO TABS: 60.0000 mg | ORAL_TABLET | Freq: Every day | ORAL | 0 refills | Status: AC

## 2022-04-29 NOTE — Discharge Instructions (Addendum)
You have tested positive for flu this is likely contributing to a lot of your symptoms and triggering an asthma exacerbation.  It does look like you have a left-sided ear infection, please take prescribed Augmentin twice daily for the next 7 days to treat this.  Make sure you take this antibiotic with food on your stomach to help prevent diarrhea and stomach upset.  Influenza must run its course use ibuprofen and Tylenol to help with fevers or body aches and pain, this will also help with headache.  You can use over-the-counter cough medications like Mucinex or Robitussin.  Please make sure you are drinking plenty of fluids.  To treat asthma exacerbation continue using inhaler or nebulizer as needed and take steroids for the next 5 days.

## 2022-04-29 NOTE — ED Provider Notes (Signed)
[facility] EMERGENCY DEPARTMENT Provider Note   CSN: [id number] Arrival date & time: 04/28/22  1827     History  Chief Complaint  Patient presents with   Influenza    Courtney Clayton is a 51 y.o. female.  Courtney Clayton is a 51 y.o. female history of hypertension, asthma, CHF, diabetes, depression, who presents to the emergency department for evaluation of cough, sore throat, headache, congestion.  Symptoms have been present for about a week, worsening over the last few days with development of associated chest pain and shortness of breath.  Patient reports that she ran out of her inhaler a few days ago and feels like her asthma is flaring up.  She arrived to the emergency department last night and has not taken her blood pressure or diabetes medications since yesterday morning.  She reports subjective fevers.  Reports chest pain is primarily present with cough.  Denies any lightheadedness or syncope, no diaphoresis, nausea or vomiting.  In addition to home nebulizer and inhaler she has been using over-the-counter cold medications without much improvement.  Over the past few days she has also started to have more persistent right ear pain and is concerned she may have an ear infection.  No other aggravating or alleviating factors.  The history is provided by the patient and medical records.  Influenza Presenting symptoms: cough, fever, headache, myalgias, shortness of breath and sore throat   Presenting symptoms: no diarrhea, no nausea and no vomiting   Associated symptoms: chills, ear pain and nasal congestion        Home Medications Prior to Admission medications   Medication Sig Start Date End Date Taking? Authorizing Provider  amoxicillin-clavulanate (AUGMENTIN) 875-125 MG tablet Take 1 tablet by mouth every 12 (twelve) hours. 04/29/22  Yes [name], PA-C  acetaminophen (TYLENOL) 500 MG tablet Take 1,000 mg by mouth every 6 (six) hours as needed for  moderate pain or headache.    [provider]  albuterol (PROVENTIL) (2.5 MG/3ML) 0.083% nebulizer solution Take 3 mLs (2.5 mg total) by nebulization every 2 (two) hours as needed for wheezing. 10/04/21   [name], MD  allopurinol (ZYLOPRIM) 100 MG tablet Take 100 mg by mouth daily. 07/12/21   [provider]  buPROPion (WELLBUTRIN XL) 150 MG 24 hr tablet Take 450 mg by mouth every morning. 07/12/21   [provider]  calcium carbonate (TUMS - DOSED IN MG ELEMENTAL CALCIUM) 500 MG chewable tablet Chew 2 tablets by mouth daily as needed for indigestion or heartburn.    [provider]  cloNIDine (CATAPRES) 0.1 MG tablet Take 1 tablet (0.1 mg total) by mouth 2 (two) times daily. 10/04/21 12/03/21  [name], MD  diclofenac Sodium (VOLTAREN) 1 % GEL Apply 4 g topically 4 (four) times daily. Patient taking differently: Apply 4 g topically 4 (four) times daily as needed (knee pain). 07/16/20   Joy, Shawn C, PA-C  famotidine (PEPCID) 20 MG tablet Take 1 tablet (20 mg total) by mouth 2 (two) times daily. Patient not taking: Reported on 07/10/2019 04/01/19   Muthersbaugh, Jarrett Soho, PA-C  hydrocortisone 2.5 % lotion Apply topically 2 (two) times daily. Patient not taking: Reported on 04/15/2019 04/01/19   Muthersbaugh, Jarrett Soho, PA-C  ipratropium-albuterol (DUONEB) 0.5-2.5 (3) MG/3ML SOLN Take 3 mLs by nebulization every 4 (four) hours as needed. 04/29/22   [name], PA-C  lidocaine (LIDODERM) 5 % Place 1 patch onto the skin daily. Remove & Discard patch within 12  hours or as directed by MD Patient not taking: Reported on 10/03/2021 07/05/21   Blue, Soijett A, PA-C  losartan (COZAAR) 50 MG tablet Take 50 mg by mouth daily. 02/11/19   [provider]  metFORMIN (GLUCOPHAGE) 500 MG tablet Take 1,000 mg by mouth 2 (two) times daily with a meal. 03/20/19   [provider]  methocarbamol (ROBAXIN) 500 MG tablet Take 1 tablet (500 mg total) by mouth 2  (two) times daily. Patient not taking: Reported on 10/03/2021 07/05/21   Blue, Soijett A, PA-C  Metoprolol Tartrate 75 MG TABS Take 75 tablets by mouth 2 (two) times daily. 03/15/19   [provider]  Peak Flow Meter DEVI 1 Device by Does not apply route as needed. 10/04/21   [name], MD  pioglitazone (ACTOS) 15 MG tablet Take 15 mg by mouth daily. 07/12/21   [provider]  simvastatin (ZOCOR) 20 MG tablet Take 20 mg by mouth at bedtime. 02/11/19   [provider]  temazepam (RESTORIL) 30 MG capsule Take 30 mg by mouth at bedtime as needed for sleep.  02/11/19   [provider]      Allergies    Sulfa antibiotics    Review of Systems   Review of Systems  Constitutional:  Positive for chills and fever.  HENT:  Positive for congestion, ear pain and sore throat.   Respiratory:  Positive for cough and shortness of breath.   Cardiovascular:  Positive for chest pain.  Gastrointestinal:  Negative for abdominal pain, diarrhea, nausea and vomiting.  Musculoskeletal:  Positive for myalgias.  Neurological:  Positive for headaches. Negative for syncope.    Physical Exam Updated Vital Signs BP (!) 195/103 (BP Location: Right Arm)   Pulse 72   Temp 98.4 F (36.9 C) (Oral)   Resp 18   Ht 5\' 5"  (1.651 m)   Wt 130 kg   SpO2 99%   BMI 47.69 kg/m  Physical Exam Vitals and nursing note reviewed.  Constitutional:      General: She is not in acute distress.    Appearance: She is well-developed. She is not ill-appearing or diaphoretic.  HENT:     Head: Normocephalic and atraumatic.     Ears:     Comments: Right TM erythematous and bulging, left TM appears mildly erythematous    Nose: Rhinorrhea present.     Mouth/Throat:     Mouth: Mucous membranes are moist.     Pharynx: Oropharynx is clear.  Eyes:     General:        Right eye: No discharge.        Left eye: No discharge.  Neck:     Comments: No rigidity Cardiovascular:     Rate and Rhythm:  Normal rate and regular rhythm.     Heart sounds: Normal heart sounds. No murmur heard.    No friction rub. No gallop.  Pulmonary:     Effort: Pulmonary effort is normal. No respiratory distress.     Breath sounds: Normal breath sounds.     Comments: Respirations equal and unlabored, patient able to speak in full sentences, lungs with some intermittent expiratory wheezing and slightly decreased air movement.  Intermittent coughing during exam.  Chest pain is reproducible with palpation over the anterior chest wall, no overlying skin changes or palpable deformity Chest:     Chest wall: Tenderness present.  Abdominal:     General: Bowel sounds are normal. There is no distension.  Palpations: Abdomen is soft. There is no mass.     Tenderness: There is no abdominal tenderness. There is no guarding.     Comments: Abdomen soft, nondistended, nontender to palpation in all quadrants without guarding or peritoneal signs  Musculoskeletal:        General: No deformity.     Cervical back: Neck supple.     Right lower leg: No edema.     Left lower leg: No edema.  Lymphadenopathy:     Cervical: No cervical adenopathy.  Skin:    General: Skin is warm and dry.     Capillary Refill: Capillary refill takes less than 2 seconds.  Neurological:     Mental Status: She is alert and oriented to person, place, and time.  Psychiatric:        Mood and Affect: Mood normal.        Behavior: Behavior normal.     ED Results / Procedures / Treatments   Labs (all labs ordered are listed, but only abnormal results are displayed) Labs Reviewed  RESP PANEL BY RT-PCR (RSV, FLU A&B, COVID)  RVPGX2 - Abnormal; Notable for the following components:      Result Value   Influenza A by PCR POSITIVE (*)    All other components within normal limits  BASIC METABOLIC PANEL - Abnormal; Notable for the following components:   Glucose, Bld 128 (*)    Creatinine, Ser 1.05 (*)    All other components within normal limits   GROUP A STREP BY PCR  CBC  TROPONIN I (HIGH SENSITIVITY)  TROPONIN I (HIGH SENSITIVITY)    EKG EKG Interpretation  Date/Time:  Wednesday April 28 2022 18:56:51 EST Ventricular Rate:  95 PR Interval:  144 QRS Duration: 82 QT Interval:  378 QTC Calculation: 475 R Axis:   133 Text Interpretation: Normal sinus rhythm Right axis deviation Nonspecific T wave abnormality Prolonged QT Poor data quality otherwise no acute ST/T changes Confirmed by Pricilla Loveless [phone]) on 04/29/2022 8:01:43 AM  Radiology DG Chest 2 View  Result Date: 04/28/2022 CLINICAL DATA:  Chest pain EXAM: CHEST - 2 VIEW COMPARISON:  10/02/2021 FINDINGS: The heart size and mediastinal contours are within normal limits. Both lungs are clear. The visualized skeletal structures are unremarkable. IMPRESSION: No active cardiopulmonary disease. Electronically Signed   By: Jasmine Pang M.D.   On: 04/28/2022 19:46    Procedures Procedures    Medications Ordered in ED Medications  albuterol (VENTOLIN HFA) 108 (90 Base) MCG/ACT inhaler 1 puff (1 puff Inhalation Given 04/28/22 1857)  losartan (COZAAR) tablet 50 mg (50 mg Oral Given 04/28/22 1858)  metoprolol tartrate (LOPRESSOR) tablet 75 mg (75 mg Oral Given 04/28/22 1857)  ipratropium-albuterol (DUONEB) 0.5-2.5 (3) MG/3ML nebulizer solution 3 mL (3 mLs Nebulization Given 04/29/22 0956)  predniSONE (DELTASONE) tablet 60 mg (60 mg Oral Given 04/29/22 0955)  guaiFENesin (MUCINEX) 12 hr tablet 600 mg (600 mg Oral Given 04/29/22 0956)  amoxicillin-clavulanate (AUGMENTIN) 875-125 MG per tablet 1 tablet (1 tablet Oral Given 04/29/22 0956)  losartan (COZAAR) tablet 50 mg (50 mg Oral Given 04/29/22 1121)    ED Course/ Medical Decision Making/ A&P                           Medical Decision Making Risk OTC drugs. Prescription drug management.   51 y.o. female presents to the ED with complaints of cough, sore throat and congestion for a week with developing shortness  of  breath and chest pain over the past few days, this involves an extensive number of treatment options, and is a complaint that carries with it a high risk of complications and morbidity.  The differential diagnosis includes influenza, COVID, other viral upper respiratory infection, bronchitis, asthma exacerbation, CHF exacerbation, ACS, PE, hypertensive urgency/emergency, musculoskeletal pain  On arrival pt is nontoxic, vitals significant for hypertension, patient has not had her blood pressure medication since yesterday morning otherwise vitals normal.   Additional history obtained from chart review. Previous records obtained and reviewed  I ordered medication including patient's home blood pressure medication as well as albuterol nebulizer treatment, prednisone, Mucinex and Augmentin  Lab Tests:  I Ordered, reviewed, and interpreted labs, which included: No leukocytosis and normal hemoglobin, creatinine of 1.05 at baseline for patient, glucose of 128, no other electrolyte derangements.  Troponin negative x 2 despite elevated blood pressures, have very low suspicion for hypertensive urgency or emergency contributing to chest pain especially since this is reproducible on palpable patient and seems to be more so associated with cough.  Respiratory viral panel is positive for influenza A  Imaging Studies ordered:  I ordered imaging studies which included chest x-ray, I independently visualized and interpreted imaging which showed no evidence of pneumonia or pulmonary edema  ED Course:   Patient with 1 week of upper respiratory symptoms likely due to influenza A.  Now experiencing more ear pain, exam is consistent with otitis media so we will treat with Augmentin.  Cardiac workup has overall been reassuring and while patient's blood pressure is elevated she has been coughing and has not had her medications on a routine schedule, do not feel that chest pain is related to cardiac issue but or some  musculoskeletal pain in the setting of ongoing cough.  Low suspicion for PE.  Patient has also had some wheezing noted and in the setting of viral infection could be having a mild asthma exacerbation.  After treatment here in the ED patient is feeling much better.  Will discharge home with new albuterol inhaler as well as short burst of steroids and prescription for Augmentin given otitis media noted.  Discussed close PCP follow-up and strict return precautions if patient feels that she is worsening despite these treatments at home.  She expresses understanding and agreement, discharged home in good condition.   Portions of this note were generated with [employer]. Dictation errors may occur despite best attempts at proofreading.         Final Clinical Impression(s) / ED Diagnoses Final diagnoses:  Influenza  Acute otitis media, unspecified otitis media type  Exacerbation of asthma, unspecified asthma severity, unspecified whether persistent    Rx / DC Orders ED Discharge Orders          Ordered    amoxicillin-clavulanate (AUGMENTIN) 875-125 MG tablet  Every 12 hours        04/29/22 1048    predniSONE (DELTASONE) 20 MG tablet  Daily        04/29/22 1048    ipratropium-albuterol (DUONEB) 0.5-2.5 (3) MG/3ML SOLN  Every 4 hours PRN        04/29/22 1048              Benedetto Goad [place], [place] 05/20/22 1334    Sherwood Gambler, MD 05/21/22 [phone]

## 2022-06-23 ENCOUNTER — Emergency Department (HOSPITAL_COMMUNITY): Payer: Self-pay

## 2022-06-23 ENCOUNTER — Other Ambulatory Visit: Payer: Self-pay

## 2022-06-23 ENCOUNTER — Encounter (HOSPITAL_COMMUNITY): Payer: Self-pay

## 2022-06-23 ENCOUNTER — Inpatient Hospital Stay (HOSPITAL_COMMUNITY)
Admission: EM | Admit: 2022-06-23 | Discharge: 2022-06-24 | DRG: 305 | Disposition: A | Discharge status: Home / Self Care | Payer: Self-pay | Attending: Internal Medicine | Admitting: Internal Medicine

## 2022-06-23 DIAGNOSIS — Z91148 Patient's other noncompliance with medication regimen for other reason: Secondary | ICD-10-CM

## 2022-06-23 DIAGNOSIS — Z6841 Body Mass Index (BMI) 40.0 and over, adult: Secondary | ICD-10-CM

## 2022-06-23 DIAGNOSIS — I252 Old myocardial infarction: Secondary | ICD-10-CM

## 2022-06-23 DIAGNOSIS — R079 Chest pain, unspecified: Principal | ICD-10-CM

## 2022-06-23 DIAGNOSIS — Z882 Allergy status to sulfonamides status: Secondary | ICD-10-CM

## 2022-06-23 DIAGNOSIS — I5032 Chronic diastolic (congestive) heart failure: Secondary | ICD-10-CM | POA: Diagnosis present

## 2022-06-23 DIAGNOSIS — I2510 Atherosclerotic heart disease of native coronary artery without angina pectoris: Secondary | ICD-10-CM | POA: Diagnosis present

## 2022-06-23 DIAGNOSIS — J45909 Unspecified asthma, uncomplicated: Secondary | ICD-10-CM | POA: Diagnosis present

## 2022-06-23 DIAGNOSIS — I110 Hypertensive heart disease with heart failure: Secondary | ICD-10-CM | POA: Diagnosis present

## 2022-06-23 DIAGNOSIS — F32A Depression, unspecified: Secondary | ICD-10-CM | POA: Diagnosis present

## 2022-06-23 DIAGNOSIS — E785 Hyperlipidemia, unspecified: Secondary | ICD-10-CM | POA: Diagnosis present

## 2022-06-23 DIAGNOSIS — Z79899 Other long term (current) drug therapy: Secondary | ICD-10-CM

## 2022-06-23 DIAGNOSIS — E876 Hypokalemia: Secondary | ICD-10-CM | POA: Diagnosis present

## 2022-06-23 DIAGNOSIS — Z7984 Long term (current) use of oral hypoglycemic drugs: Secondary | ICD-10-CM

## 2022-06-23 DIAGNOSIS — E119 Type 2 diabetes mellitus without complications: Secondary | ICD-10-CM

## 2022-06-23 DIAGNOSIS — M109 Gout, unspecified: Secondary | ICD-10-CM | POA: Diagnosis present

## 2022-06-23 DIAGNOSIS — I160 Hypertensive urgency: Principal | ICD-10-CM | POA: Diagnosis present

## 2022-06-23 DIAGNOSIS — G4700 Insomnia, unspecified: Secondary | ICD-10-CM | POA: Diagnosis present

## 2022-06-23 DIAGNOSIS — Z1152 Encounter for screening for COVID-19: Secondary | ICD-10-CM

## 2022-06-23 MED ORDER — LOSARTAN POTASSIUM 25 MG PO TABS
50.0000 mg | ORAL_TABLET | Freq: Once | ORAL | Status: AC
  Administered 2022-06-24: 50 mg via ORAL
  Filled 2022-06-23: qty 2

## 2022-06-23 MED ORDER — CLONIDINE HCL 0.1 MG PO TABS
0.1000 mg | ORAL_TABLET | Freq: Once | ORAL | Status: AC
  Administered 2022-06-24: 0.1 mg via ORAL
  Filled 2022-06-23: qty 1

## 2022-06-23 MED ORDER — METOPROLOL TARTRATE 25 MG PO TABS
75.0000 mg | ORAL_TABLET | Freq: Once | ORAL | Status: AC
  Administered 2022-06-24: 75 mg via ORAL
  Filled 2022-06-23: qty 3

## 2022-06-23 NOTE — ED Provider Triage Note (Signed)
Emergency Medicine Provider Triage Evaluation Note  Courtney Clayton , a 52 y.o. female  was evaluated in triage.  Pt with past medical history hypertension, type 2 diabetes, hyperlipidemia, heart failure complains of midsternal pressure, nausea, shortness of breath, and dizziness for the last week.  She states that the chest pain is nonradiating but has been getting worse.  States she has had the symptoms before when her blood pressure has gotten out of control.  She has been out of all of her chronic medications for the last 2 to 3 months due to financial difficulties.  States that she is currently employed living paycheck to paycheck and her employer does not provide insurance coverage and she was being followed by a PCP but had to stop seeing them after getting behind on pain meds.  She denies abdominal pain, vomiting, diarrhea, fever, chills, dysuria.  States that she has recently had a productive cough with green sputum and nasal congestion.  No known sick contacts.  She attempted to go to urgent care to get her chronic medications refilled but was told that they could not help her with this that she had to present to the ED instead.  Review of Systems  Positive: See HPI Negative: See HPI  Physical Exam  BP (!) 214/140 (BP Location: Right Arm)   Pulse 92   Temp 98.9 F (37.2 C) (Oral)   Resp 16   SpO2 98%  Gen:   Awake, no distress   Resp:  Normal effort lungs clear to auscultation MSK:   Moves extremities without difficulty bilateral 1+ pitting lower extremity edema Other:  Regular rate and rhythm, abdomen soft and nontender, alert and oriented and neurologically intact  Medical Decision Making  Medically screening exam initiated at 11:21 PM.  Appropriate orders placed.  Daralene Milch was informed that the remainder of the evaluation will be completed by another provider, this initial triage assessment does not replace that evaluation, and the importance of remaining in the ED until  their evaluation is complete.     Suzzette Righter, PA-C 06/23/22 2324

## 2022-06-23 NOTE — ED Triage Notes (Signed)
Intermittent chest pain for the past week. Ran out of BP medication about a month or two ago.

## 2022-06-23 NOTE — ED Provider Notes (Signed)
[facility] Provider Note   CSN: [id number] Arrival date & time: 06/23/22  2159     History {Add pertinent medical, surgical, social history, OB history to HPI:1} Chief Complaint  Patient presents with   Chest Pain    Courtney Clayton is a 52 y.o. female.  Patient with history of hypertension, CHF, diabetes presenting with elevated blood pressure and chest pain.  She has not had her medications for at least 3 months.  States intermittent chest pain for the past 1 week in the center of her chest that radiates to her mid back.  Pain comes and goes lasting for several minutes to hours at a time and is associated with shortness of breath.  Not having chest pain currently.  Having nausea but no vomiting.  No cough or fever.  Pain is not exertional or pleuritic.  Reports negative stress test several years ago.  No catheterizations.  States the chest pain does not radiate except to her mid back and is associated with shortness of breath and nausea.  No abdominal pain, vomiting, diarrhea, fever or chills.  Also has had a gradual onset diffuse headache associated with lightheadedness.  No room spinning vertigo.  No sudden onset headache.  No focal weakness, numbness or tingling.  No pain with urination or blood in the urine.  The history is provided by the patient.  Chest Pain Associated symptoms: dizziness, headache, nausea and shortness of breath   Associated symptoms: no abdominal pain, no back pain, no cough, no fever, no vomiting and no weakness        Home Medications Prior to Admission medications   Medication Sig Start Date End Date Taking? Authorizing Provider  acetaminophen (TYLENOL) 500 MG tablet Take 1,000 mg by mouth every 6 (six) hours as needed for moderate pain or headache.    [provider]  albuterol (PROVENTIL) (2.5 MG/3ML) 0.083% nebulizer solution Take 3 mLs (2.5 mg total) by nebulization every 2 (two) hours as needed  for wheezing. 10/04/21   [name], MD  allopurinol (ZYLOPRIM) 100 MG tablet Take 100 mg by mouth daily. 07/12/21   [provider]  amoxicillin-clavulanate (AUGMENTIN) 875-125 MG tablet Take 1 tablet by mouth every 12 (twelve) hours. 04/29/22   [name], PA-C  buPROPion (WELLBUTRIN XL) 150 MG 24 hr tablet Take 450 mg by mouth every morning. 07/12/21   [provider]  calcium carbonate (TUMS - DOSED IN MG ELEMENTAL CALCIUM) 500 MG chewable tablet Chew 2 tablets by mouth daily as needed for indigestion or heartburn.    [provider]  cloNIDine (CATAPRES) 0.1 MG tablet Take 1 tablet (0.1 mg total) by mouth 2 (two) times daily. 10/04/21 12/03/21  [name], MD  diclofenac Sodium (VOLTAREN) 1 % GEL Apply 4 g topically 4 (four) times daily. Patient taking differently: Apply 4 g topically 4 (four) times daily as needed (knee pain). 07/16/20   Joy, Shawn C, PA-C  famotidine (PEPCID) 20 MG tablet Take 1 tablet (20 mg total) by mouth 2 (two) times daily. Patient not taking: Reported on 07/10/2019 04/01/19   Muthersbaugh, Jarrett Soho, PA-C  hydrocortisone 2.5 % lotion Apply topically 2 (two) times daily. Patient not taking: Reported on 04/15/2019 04/01/19   Muthersbaugh, Jarrett Soho, PA-C  ipratropium-albuterol (DUONEB) 0.5-2.5 (3) MG/3ML SOLN Take 3 mLs by nebulization every 4 (four) hours as needed. 04/29/22   [name], PA-C  lidocaine (LIDODERM) 5 % Place 1 patch onto the skin daily.  Remove & Discard patch within 12 hours or as directed by MD Patient not taking: Reported on 10/03/2021 07/05/21   Blue, Soijett A, PA-C  losartan (COZAAR) 50 MG tablet Take 50 mg by mouth daily. 02/11/19   [provider]  metFORMIN (GLUCOPHAGE) 500 MG tablet Take 1,000 mg by mouth 2 (two) times daily with a meal. 03/20/19   [provider]  methocarbamol (ROBAXIN) 500 MG tablet Take 1 tablet (500 mg total) by mouth 2 (two) times daily. Patient not taking: Reported on  10/03/2021 07/05/21   Blue, Soijett A, PA-C  Metoprolol Tartrate 75 MG TABS Take 75 tablets by mouth 2 (two) times daily. 03/15/19   [provider]  Peak Flow Meter DEVI 1 Device by Does not apply route as needed. 10/04/21   [name], MD  pioglitazone (ACTOS) 15 MG tablet Take 15 mg by mouth daily. 07/12/21   [provider]  simvastatin (ZOCOR) 20 MG tablet Take 20 mg by mouth at bedtime. 02/11/19   [provider]  temazepam (RESTORIL) 30 MG capsule Take 30 mg by mouth at bedtime as needed for sleep.  02/11/19   [provider]      Allergies    Sulfa antibiotics    Review of Systems   Review of Systems  Constitutional:  Negative for activity change, appetite change and fever.  HENT:  Negative for congestion and rhinorrhea.   Eyes:  Negative for visual disturbance.  Respiratory:  Positive for chest tightness and shortness of breath. Negative for cough.   Cardiovascular:  Positive for chest pain.  Gastrointestinal:  Positive for nausea. Negative for abdominal pain and vomiting.  Genitourinary:  Negative for dysuria and hematuria.  Musculoskeletal:  Negative for arthralgias, back pain and myalgias.  Skin:  Negative for rash.  Neurological:  Positive for dizziness, light-headedness and headaches. Negative for weakness.   all other systems are negative except as noted in the HPI and PMH.    Physical Exam Updated Vital Signs BP (!) 214/140 (BP Location: Right Arm)   Pulse 92   Temp 98.9 F (37.2 C) (Oral)   Resp 16   SpO2 98%  Physical Exam Vitals and nursing note reviewed.  Constitutional:      General: She is not in acute distress.    Appearance: She is well-developed.  HENT:     Head: Normocephalic and atraumatic.     Mouth/Throat:     Pharynx: No oropharyngeal exudate.  Eyes:     Conjunctiva/sclera: Conjunctivae normal.     Pupils: Pupils are equal, round, and reactive to light.  Neck:     Comments: No  meningismus. Cardiovascular:     Rate and Rhythm: Normal rate and regular rhythm.     Heart sounds: Normal heart sounds. No murmur heard. Pulmonary:     Effort: Pulmonary effort is normal. No respiratory distress.     Breath sounds: Normal breath sounds.  Chest:     Chest wall: No tenderness.  Abdominal:     Palpations: Abdomen is soft.     Tenderness: There is no abdominal tenderness. There is no guarding or rebound.  Musculoskeletal:        General: No tenderness. Normal range of motion.     Cervical back: Normal range of motion and neck supple.  Skin:    General: Skin is warm.  Neurological:     Mental Status: She is alert and oriented to person, place, and time.     Cranial Nerves: No  cranial nerve deficit.     Motor: No abnormal muscle tone.     Coordination: Coordination normal.     Comments: CN 2-12 intact, no ataxia on finger to nose, no nystagmus, 5/5 strength throughout, no pronator drift,    Psychiatric:        Behavior: Behavior normal.     ED Results / Procedures / Treatments   Labs (all labs ordered are listed, but only abnormal results are displayed) Labs Reviewed  RESP PANEL BY RT-PCR (RSV, FLU A&B, COVID)  RVPGX2  CBC  COMPREHENSIVE METABOLIC PANEL  TROPONIN I (HIGH SENSITIVITY)    EKG EKG Interpretation  Date/Time:  Wednesday June 23 2022 22:39:23 EST Ventricular Rate:  86 PR Interval:  144 QRS Duration: 102 QT Interval:  407 QTC Calculation: 487 R Axis:   79 Text Interpretation: Sinus rhythm Borderline prolonged QT interval No previous ECGs available Confirmed by Ezequiel Essex [phone]) on 06/23/2022 11:23:15 PM  Radiology No results found.  Procedures Procedures  {Document cardiac monitor, telemetry assessment procedure when appropriate:1}  Medications Ordered in ED Medications  cloNIDine (CATAPRES) tablet 0.1 mg (has no administration in time range)  metoprolol tartrate (LOPRESSOR) tablet 75 mg (has no administration in time range)   losartan (COZAAR) tablet 50 mg (has no administration in time range)    ED Course/ Medical Decision Making/ A&P   {   Click here for ABCD2, HEART and other calculatorsREFRESH Note before signing :1}                          Medical Decision Making Amount and/or Complexity of Data Reviewed Labs: ordered. Decision-making details documented in ED Course. Radiology: ordered and independent interpretation performed. Decision-making details documented in ED Course. ECG/medicine tests: ordered and independent interpretation performed. Decision-making details documented in ED Course.  Risk Prescription drug management.   1 week of intermittent central chest pain going to the mid back associated with headache and dizziness.  Hypertensive 214/140 on arrival.  She is given her home blood pressure medications including clonidine, metoprolol and losartan.  Nonfocal neurological exam.  EKG without acute ischemia.  {Document critical care time when appropriate:1} {Document review of labs and clinical decision tools ie heart score, Chads2Vasc2 etc:1}  {Document your independent review of radiology images, and any outside records:1} {Document your discussion with family members, caretakers, and with consultants:1} {Document social determinants of health affecting pt's care:1} {Document your decision making why or why not admission, treatments were needed:1} Final Clinical Impression(s) / ED Diagnoses Final diagnoses:  None    Rx / DC Orders ED Discharge Orders     None

## 2022-06-24 ENCOUNTER — Encounter (HOSPITAL_COMMUNITY): Payer: Self-pay

## 2022-06-24 ENCOUNTER — Emergency Department (HOSPITAL_COMMUNITY): Payer: Self-pay

## 2022-06-24 ENCOUNTER — Other Ambulatory Visit (HOSPITAL_COMMUNITY): Payer: Self-pay

## 2022-06-24 ENCOUNTER — Inpatient Hospital Stay (HOSPITAL_COMMUNITY): Payer: Self-pay

## 2022-06-24 DIAGNOSIS — R079 Chest pain, unspecified: Secondary | ICD-10-CM

## 2022-06-24 DIAGNOSIS — I160 Hypertensive urgency: Secondary | ICD-10-CM

## 2022-06-24 LAB — CBC
HCT: 40.1 % (ref 36.0–46.0)
HCT: 41.5 % (ref 36.0–46.0)
Hemoglobin: 12.2 g/dL (ref 12.0–15.0)
Hemoglobin: 13 g/dL (ref 12.0–15.0)
MCH: 25.5 pg — ABNORMAL LOW (ref 26.0–34.0)
MCH: 26.3 pg (ref 26.0–34.0)
MCHC: 30.4 g/dL (ref 30.0–36.0)
MCHC: 31.3 g/dL (ref 30.0–36.0)
MCV: 83.8 fL (ref 80.0–100.0)
MCV: 83.9 fL (ref 80.0–100.0)
Platelets: 250 10*3/uL (ref 150–400)
Platelets: 289 10*3/uL (ref 150–400)
RBC: 4.78 MIL/uL (ref 3.87–5.11)
RBC: 4.95 MIL/uL (ref 3.87–5.11)
RDW: 15.1 % (ref 11.5–15.5)
RDW: 15.2 % (ref 11.5–15.5)
WBC: 9.1 10*3/uL (ref 4.0–10.5)
WBC: 9.7 10*3/uL (ref 4.0–10.5)
nRBC: 0 % (ref 0.0–0.2)
nRBC: 0 % (ref 0.0–0.2)

## 2022-06-24 LAB — ECHOCARDIOGRAM COMPLETE
Area-P 1/2: 2.7 cm2
Calc EF: 69.8 %
S' Lateral: 2.2 cm
Single Plane A2C EF: 70.2 %
Single Plane A4C EF: 70.1 %

## 2022-06-24 LAB — COMPREHENSIVE METABOLIC PANEL
ALT: 17 U/L (ref 0–44)
ALT: 14 U/L (ref 0–44)
AST: 26 U/L (ref 15–41)
AST: 22 U/L (ref 15–41)
Albumin: 3.8 g/dL (ref 3.5–5.0)
Albumin: 3.4 g/dL — ABNORMAL LOW (ref 3.5–5.0)
Alkaline Phosphatase: 66 U/L (ref 38–126)
Alkaline Phosphatase: 59 U/L (ref 38–126)
Anion gap: 10 (ref 5–15)
Anion gap: 8 (ref 5–15)
BUN: 11 mg/dL (ref 6–20)
BUN: 11 mg/dL (ref 6–20)
CO2: 26 mmol/L (ref 22–32)
CO2: 28 mmol/L (ref 22–32)
Calcium: 9.1 mg/dL (ref 8.9–10.3)
Calcium: 9 mg/dL (ref 8.9–10.3)
Chloride: 102 mmol/L (ref 98–111)
Chloride: 102 mmol/L (ref 98–111)
Creatinine, Ser: 0.86 mg/dL (ref 0.44–1.00)
Creatinine, Ser: 0.83 mg/dL (ref 0.44–1.00)
GFR, Estimated: >60 mL/min (ref >60)
GFR, Estimated: >60 mL/min (ref >60)
Glucose, Bld: 189 mg/dL — ABNORMAL HIGH (ref 70–99)
Glucose, Bld: 165 mg/dL — ABNORMAL HIGH (ref 70–99)
Potassium: 3.3 mmol/L — ABNORMAL LOW (ref 3.5–5.1)
Potassium: 3.7 mmol/L (ref 3.5–5.1)
Sodium: 138 mmol/L (ref 135–145)
Sodium: 138 mmol/L (ref 135–145)
Total Bilirubin: 0.4 mg/dL (ref 0.3–1.2)
Total Bilirubin: 0.3 mg/dL (ref 0.3–1.2)
Total Protein: 7.6 g/dL (ref 6.5–8.1)
Total Protein: 6.8 g/dL (ref 6.5–8.1)

## 2022-06-24 LAB — URIC ACID: Uric Acid, Serum: 6.8 mg/dL (ref 2.5–7.1)

## 2022-06-24 LAB — TSH: TSH: 0.399 u[IU]/mL (ref 0.350–4.500)

## 2022-06-24 LAB — LIPID PANEL
Cholesterol: 234 mg/dL — ABNORMAL HIGH (ref 0–200)
HDL: 56 mg/dL (ref >40)
LDL Cholesterol: 164 mg/dL — ABNORMAL HIGH (ref 0–99)
Total CHOL/HDL Ratio: 4.2 RATIO
Triglycerides: 69 mg/dL (ref <150)
VLDL: 14 mg/dL (ref 0–40)

## 2022-06-24 LAB — RESP PANEL BY RT-PCR (RSV, FLU A&B, COVID)  RVPGX2
Influenza A by PCR: NEGATIVE
Influenza B by PCR: NEGATIVE
Resp Syncytial Virus by PCR: NEGATIVE
SARS Coronavirus 2 by RT PCR: NEGATIVE

## 2022-06-24 LAB — TROPONIN I (HIGH SENSITIVITY)
Troponin I (High Sensitivity): 3 ng/L (ref <18)
Troponin I (High Sensitivity): 3 ng/L (ref <18)

## 2022-06-24 LAB — HEMOGLOBIN A1C
Hgb A1c MFr Bld: 6.9 % — ABNORMAL HIGH (ref 4.8–5.6)
Mean Plasma Glucose: 151.33 mg/dL

## 2022-06-24 LAB — BRAIN NATRIURETIC PEPTIDE: B Natriuretic Peptide: 15.8 pg/mL (ref 0.0–100.0)

## 2022-06-24 LAB — MAGNESIUM: Magnesium: 2.1 mg/dL (ref 1.7–2.4)

## 2022-06-24 LAB — CBG MONITORING, ED
Glucose-Capillary: 141 mg/dL — ABNORMAL HIGH (ref 70–99)
Glucose-Capillary: 127 mg/dL — ABNORMAL HIGH (ref 70–99)

## 2022-06-24 MED ORDER — BUPROPION HCL ER (XL) 150 MG PO TB24
450.0000 mg | ORAL_TABLET | Freq: Every morning | ORAL | 0 refills | Status: DC
  Filled 2022-06-24: qty 90, 30d supply, fill #0

## 2022-06-24 MED ORDER — BLOOD GLUCOSE MONITOR SYSTEM W/DEVICE KIT
1.0000 | PACK | Freq: Three times a day (TID) | 0 refills | Status: DC
  Filled 2022-06-24: qty 1, 30d supply, fill #0

## 2022-06-24 MED ORDER — ACETAMINOPHEN 325 MG PO TABS: 650.0000 mg | ORAL_TABLET | Freq: Four times a day (QID) | ORAL | Status: DC | PRN

## 2022-06-24 MED ORDER — METOPROLOL TARTRATE 75 MG PO TABS
75.0000 mg | ORAL_TABLET | Freq: Two times a day (BID) | ORAL | 0 refills | Status: DC
  Filled 2022-06-24 – 2022-06-25 (×2): qty 60, 30d supply, fill #0

## 2022-06-24 MED ORDER — ALLOPURINOL 100 MG PO TABS
100.0000 mg | ORAL_TABLET | Freq: Every day | ORAL | 0 refills | Status: DC
  Filled 2022-06-24 – 2022-08-31 (×2): qty 30, 30d supply, fill #0
  Filled 2022-11-03: qty 30, 30d supply, fill #1

## 2022-06-24 MED ORDER — AMLODIPINE BESYLATE 5 MG PO TABS
10.0000 mg | ORAL_TABLET | Freq: Every day | ORAL | Status: DC
  Administered 2022-06-24: 10 mg via ORAL
  Filled 2022-06-24: qty 2

## 2022-06-24 MED ORDER — SIMVASTATIN 20 MG PO TABS
20.0000 mg | ORAL_TABLET | Freq: Every day | ORAL | 0 refills | Status: DC
  Filled 2022-06-24: qty 30, 30d supply, fill #0

## 2022-06-24 MED ORDER — CETIRIZINE HCL 10 MG PO TABS
10.0000 mg | ORAL_TABLET | Freq: Every evening | ORAL | 0 refills | Status: DC | PRN
  Filled 2022-06-24: qty 30, 30d supply, fill #0
  Filled 2022-11-03: qty 100, 100d supply, fill #0

## 2022-06-24 MED ORDER — METOPROLOL TARTRATE 25 MG PO TABS
75.0000 mg | ORAL_TABLET | Freq: Two times a day (BID) | ORAL | Status: DC
  Administered 2022-06-24: 75 mg via ORAL
  Filled 2022-06-24: qty 3

## 2022-06-24 MED ORDER — LANCET DEVICE MISC
1.0000 | Freq: Three times a day (TID) | 0 refills | Status: AC
  Filled 2022-06-24: qty 1, 30d supply, fill #0

## 2022-06-24 MED ORDER — HEPARIN SODIUM (PORCINE) 5000 UNIT/ML IJ SOLN
5000.0000 [IU] | Freq: Three times a day (TID) | INTRAMUSCULAR | Status: DC
  Administered 2022-06-24: 5000 [IU] via SUBCUTANEOUS
  Filled 2022-06-24 (×2): qty 1

## 2022-06-24 MED ORDER — LOSARTAN POTASSIUM 100 MG PO TABS
100.0000 mg | ORAL_TABLET | Freq: Every day | ORAL | 0 refills | Status: DC
  Filled 2022-06-24: qty 30, 30d supply, fill #0

## 2022-06-24 MED ORDER — LOSARTAN POTASSIUM 25 MG PO TABS
100.0000 mg | ORAL_TABLET | Freq: Every day | ORAL | Status: DC
  Administered 2022-06-24: 100 mg via ORAL
  Filled 2022-06-24: qty 4

## 2022-06-24 MED ORDER — AMLODIPINE BESYLATE 10 MG PO TABS
10.0000 mg | ORAL_TABLET | Freq: Every day | ORAL | 0 refills | Status: DC
  Filled 2022-06-24: qty 30, 30d supply, fill #0

## 2022-06-24 MED ORDER — IPRATROPIUM-ALBUTEROL 0.5-2.5 (3) MG/3ML IN SOLN: 3.0000 mL | RESPIRATORY_TRACT | Status: DC | PRN

## 2022-06-24 MED ORDER — ASPIRIN 81 MG PO CHEW
324.0000 mg | CHEWABLE_TABLET | Freq: Once | ORAL | Status: AC
  Administered 2022-06-24: 324 mg via ORAL
  Filled 2022-06-24: qty 4

## 2022-06-24 MED ORDER — INSULIN ASPART 100 UNIT/ML IJ SOLN
0.0000 [IU] | Freq: Three times a day (TID) | INTRAMUSCULAR | Status: DC
  Filled 2022-06-24: qty 0.06

## 2022-06-24 MED ORDER — METFORMIN HCL 500 MG PO TABS
1000.0000 mg | ORAL_TABLET | Freq: Two times a day (BID) | ORAL | 2 refills | Status: DC
  Filled 2022-06-24: qty 120, 30d supply, fill #0

## 2022-06-24 MED ORDER — ALBUTEROL SULFATE (2.5 MG/3ML) 0.083% IN NEBU: 2.5000 mg | INHALATION_SOLUTION | RESPIRATORY_TRACT | Status: DC | PRN

## 2022-06-24 MED ORDER — ASPIRIN 81 MG PO TBEC
81.0000 mg | DELAYED_RELEASE_TABLET | Freq: Every day | ORAL | Status: DC
  Administered 2022-06-24: 81 mg via ORAL
  Filled 2022-06-24: qty 1

## 2022-06-24 MED ORDER — PIOGLITAZONE HCL 15 MG PO TABS
15.0000 mg | ORAL_TABLET | Freq: Every day | ORAL | 0 refills | Status: DC
  Filled 2022-06-24: qty 30, 30d supply, fill #0

## 2022-06-24 MED ORDER — FREESTYLE LANCETS MISC
1.0000 | Freq: Three times a day (TID) | 0 refills | Status: AC
  Filled 2022-06-24: qty 100, 33d supply, fill #0

## 2022-06-24 MED ORDER — GLUCOSE BLOOD VI STRP
1.0000 | ORAL_STRIP | Freq: Three times a day (TID) | 0 refills | Status: AC
  Filled 2022-06-24: qty 100, 34d supply, fill #0

## 2022-06-24 MED ORDER — ONDANSETRON HCL 4 MG PO TABS: 4.0000 mg | ORAL_TABLET | Freq: Four times a day (QID) | ORAL | Status: DC | PRN

## 2022-06-24 MED ORDER — HYDRALAZINE HCL 20 MG/ML IJ SOLN
5.0000 mg | Freq: Once | INTRAMUSCULAR | Status: AC
  Administered 2022-06-24: 5 mg via INTRAVENOUS
  Filled 2022-06-24: qty 1

## 2022-06-24 MED ORDER — ZOLPIDEM TARTRATE ER 12.5 MG PO TBCR
12.5000 mg | EXTENDED_RELEASE_TABLET | Freq: Every evening | ORAL | 3 refills | Status: DC | PRN
  Filled 2022-06-24: qty 30, 30d supply, fill #0

## 2022-06-24 MED ORDER — ONDANSETRON HCL 4 MG/2ML IJ SOLN: 4.0000 mg | Freq: Four times a day (QID) | INTRAMUSCULAR | Status: DC | PRN

## 2022-06-24 MED ORDER — SODIUM CHLORIDE (PF) 0.9 % IJ SOLN
INTRAMUSCULAR | Status: AC
  Filled 2022-06-24: qty 50

## 2022-06-24 MED ORDER — HYDRALAZINE HCL 20 MG/ML IJ SOLN: 5.0000 mg | Freq: Four times a day (QID) | INTRAMUSCULAR | Status: DC | PRN

## 2022-06-24 MED ORDER — IOHEXOL 350 MG/ML SOLN
100.0000 mL | Freq: Once | INTRAVENOUS | Status: AC | PRN
  Administered 2022-06-24: 100 mL via INTRAVENOUS

## 2022-06-24 MED ORDER — ACETAMINOPHEN 650 MG RE SUPP: 650.0000 mg | Freq: Four times a day (QID) | RECTAL | Status: DC | PRN

## 2022-06-24 NOTE — Discharge Instructions (Addendum)
[facility] Ph# [phone]   Heart Healthy, Consistent Carbohydrate Nutrition Therapy   A heart-healthy and consistent carbohydrate diet is recommended to manage heart disease and diabetes. To follow a heart-healthy and consistent carbohydrate diet, Eat a balanced diet with whole grains, fruits and vegetables, and lean protein sources.  Choose heart-healthy unsaturated fats. Limit saturated fats, trans fats, and cholesterol intake. Eat more plant-based or vegetarian meals using beans and soy foods for protein.  Eat whole, unprocessed foods to limit the amount of sodium (salt) you eat.  Choose a consistent amount of carbohydrate at each meal and snack. Limit refined carbohydrates especially sugar, sweets and sugar-sweetened beverages.  If you drink alcohol, do so in moderation: one serving per day (women) and two servings per day (men). o One serving is equivalent to 12 ounces beer, 5 ounces wine, or 1.5 ounces distilled spirits  Tips Tips for Choosing Heart-Healthy Fats Choose lean protein and low-fat dairy foods to reduce saturated fat intake. Saturated fat is usually found in animal-based protein and is associated with certain health risks. Saturated fat is the biggest contributor to raise low-density lipoprotein (LDL) cholesterol levels. Research shows that limiting saturated fat lowers unhealthy cholesterol levels. Eat no more than 7% of your total calories each day from saturated fat. Ask your RDN to help you determine how much saturated fat is right for you. There are many foods that do not contain large amounts of saturated fats. Swapping these foods to replace foods high in saturated fats will help you limit the saturated fat you eat and improve your cholesterol levels. You can also try eating more plant-based or vegetarian meals. Instead of. Try:  Whole milk, cheese, yogurt, and ice cream 1% or skim milk, low-fat cheese, non-fat yogurt, and low-fat ice cream   Fatty, marbled beef and pork Lean beef, pork, or venison  Poultry with skin Poultry without skin  Butter, stick margarine Reduced-fat, whipped, or liquid spreads  Coconut oil, palm oil Liquid vegetable oils: corn, canola, olive, soybean and safflower oils   Avoid foods that contain trans fats. Trans fats increase levels of LDL-cholesterol. Hydrogenated fat in processed foods is the main source of trans fats in foods.  Trans fats can be found in stick margarine, shortening, processed sweets, baked goods, some fried foods, and packaged foods made with hydrogenated oils. Avoid foods with "partially hydrogenated oil" on the ingredient list such as: cookies, pastries, baked goods, biscuits, crackers, microwave popcorn, and frozen dinners.  Choose foods with heart healthy fats. Polyunsaturated and monounsaturated fat are unsaturated fats that may help lower your blood cholesterol level when used in place of saturated fat in your diet. Ask your RDN about taking a dietary supplement with plant sterols and stanols to help lower your cholesterol level. Research shows that substituting saturated fats with unsaturated fats is beneficial to cholesterol levels. Try these easy swaps: Instead of. Try:  Butter, stick margarine, or solid shortening Reduced-fat, whipped, or liquid spreads  Beef, pork, or poultry with skin Fish and seafood  Chips, crackers, snack foods Raw or unsalted nuts and seeds or nut butters Hummus with vegetables Avocado on toast  Coconut oil, palm oil Liquid vegetable oils: corn, canola, olive, soybean and safflower oils   Limit the amount of cholesterol you eat to less than 200 milligrams per day. Cholesterol is a substance carried through the bloodstream via lipoproteins, which are known as "transporters" of fat. Some body functions need cholesterol to work properly, but too much cholesterol in  the bloodstream can damage arteries and build up blood vessel linings (which can lead to  heart attack and stroke). You should eat less than 200 milligrams cholesterol per day. People respond differently to eating cholesterol. There is no test available right now that can figure out which people will respond more to dietary cholesterol and which will respond less. For individuals with high intake of dietary cholesterol, different types of increase (none, small, moderate, large) in LDL-cholesterol levels are all possible.  Food sources of cholesterol include egg yolks and organ meats such as liver, gizzards. Limit egg yolks to two to four per week and avoid organ meats like liver and gizzards to control cholesterol intake.  Tips for Choosing Heart-Healthy Carbohydrates Consume a consistent amount of carbohydrate It is important to eat foods with carbohydrates in moderation because they impact your blood glucose level. Carbohydrates can be found in many foods such as: Grains (breads, crackers, rice, pasta, and cereals)  Starchy Vegetables (potatoes, corn, and peas)  Beans and legumes  Milk, soy milk, and yogurt  Fruit and fruit juice  Sweets (cakes, cookies, ice cream, jam and jelly) Your RDN will help you set a goal for how many carbohydrate servings to eat at your meals and snacks. For many adults, eating 3 to 5 servings of carbohydrate foods at each meal and 1 or 2 carbohydrate servings for each snack works well.  Check your blood glucose level regularly. It can tell you if you need to adjust when you eat carbohydrates.  Choose foods rich in viscous (soluble) fiber Viscous, or soluble, is found in the walls of plant cells. Viscous fiber is found only in plant-based foods. Eating foods with fiber helps to lower your unhealthy cholesterol and keep your blood glucose in range  Rich sources of viscous fiber include vegetables (asparagus, Brussels sprouts, sweet potatoes, turnips) fruit (apricots, mangoes, oranges), legumes, and whole grains (barley, oats, and oat bran).  As you increase  your fiber intake gradually, also increase the amount of water you drink. This will help prevent constipation.  If you have difficulty achieving this goal, ask your RDN about fiber laxatives. Choose fiber supplements made with viscous fibers such as psyllium seed husks or methylcellulose to help lower unhealthy cholesterol.  Limit refined carbohydrates  There are three types of carbohydrates: starches, sugar, and fiber. Some carbohydrates occur naturally in food, like the starches in rice or corn or the sugars in fruits and milk. Refined carbohydrates--foods with high amounts of simple sugars--can raise triglyceride levels. High triglyceride levels are associated with coronary heart disease. Some examples of refined carbohydrate foods are table sugar, sweets, and beverages sweetened with added sugar.  Tips for Reducing Sodium (Salt) Although sodium is important for your body to function, too much sodium can be harmful for people with high blood pressure. As sodium and fluid buildup in your tissues and bloodstream, your blood pressure increases. High blood pressure may cause damage to other organs and increase your risk for a stroke. Even if you take a pill for blood pressure or a water pill (diuretic) to remove fluid, it is still important to have less salt in your diet. Ask your doctor and RDN what amount of sodium is right for you. Avoid processed foods. Eat more fresh foods.  Fresh fruits and vegetables are naturally low in sodium, as well as frozen vegetables and fruits that have no added juices or sauces.  Fresh meats are lower in sodium than processed meats, such as bacon, sausage, and  hotdogs. Read the nutrition label or ask your butcher to help you find a fresh meat that is low in sodium. Eat less salt--at the table and when cooking.  A single teaspoon of table salt has 2,300 mg of sodium.  Leave the salt out of recipes for pasta, casseroles, and soups.  Ask your RDN how to cook your favorite  recipes without sodium Be a smart shopper.  Look for food packages that say "salt-free" or "sodium-free." These items contain less than 5 milligrams of sodium per serving.  "Very low-sodium" products contain less than 35 milligrams of sodium per serving.  "Low-sodium" products contain less than 140 milligrams of sodium per serving.  Beware for "Unsalted" or "No Added Salt" products. These items may still be high in sodium. Check the nutrition label. Add flavors to your food without adding sodium.  Try lemon juice, lime juice, fruit juice or vinegar.  Dry or fresh herbs add flavor. Try basil, bay leaf, dill, rosemary, parsley, sage, dry mustard, nutmeg, thyme, and paprika.  Pepper, red pepper flakes, and cayenne pepper can add spice t your meals without adding sodium. Hot sauce contains sodium, but if you use just a drop or two, it will not add up to much.  Buy a sodium-free seasoning blend or make your own at home.  Additional Lifestyle Tips Achieve and maintain a healthy weight. Talk with your RDN or your doctor about what is a healthy weight for you. Set goals to reach and maintain that weight.  To lose weight, reduce your calorie intake along with increasing your physical activity. A weight loss of 10 to 15 pounds could reduce LDL-cholesterol by 5 milligrams per deciliter. Participate in physical activity. Talk with your health care team to find out what types of physical activity are best for you. Set a plan to get about 30 minutes of exercise on most days.  Foods Recommended Food Group Foods Recommended  Grains Whole grain breads and cereals, including whole wheat, barley, rye, buckwheat, corn, teff, quinoa, millet, amaranth, brown or wild rice, sorghum, and oats Pasta, especially whole wheat or other whole grain types  [employer], quinoa or wild rice Whole grain crackers, bread, rolls, pitas Home-made bread with reduced-sodium baking soda  Protein Foods Lean cuts of beef and pork  (loin, leg, round, extra lean hamburger)  Skinless Cytogeneticist and other wild game Dried beans and peas Nuts and nut butters Meat alternatives made with soy or textured vegetable protein  Egg whites or egg substitute Cold cuts made with lean meat or soy protein  Dairy Nonfat (skim), low-fat, or 1%-fat milk  Nonfat or low-fat yogurt or cottage cheese Fat-free and low-fat cheese  Vegetables Fresh, frozen, or canned vegetables without added fat or salt   Fruits Fresh, frozen, canned, or dried fruit   Oils Unsaturated oils (corn, olive, peanut, soy, sunflower, canola)  Soft or liquid margarines and vegetable oil spreads  Salad dressings Seeds and nuts  Avocado   Foods Not Recommended Food Group Foods Not Recommended  Grains Breads or crackers topped with salt Cereals (hot or cold) with more than 300 mg sodium per serving Biscuits, cornbread, and other "quick" breads prepared with baking soda Bread crumbs or stuffing mix from a store High-fat bakery products, such as doughnuts, biscuits, croissants, danish pastries, pies, cookies Instant cooking foods to which you add hot water and stir--potatoes, noodles, rice, etc. Packaged starchy foods--seasoned noodle or rice dishes, stuffing mix, macaroni and cheese dinner Snacks made with partially  hydrogenated oils, including chips, cheese puffs, snack mixes, regular crackers, butter-flavored popcorn  Protein Foods Higher-fat cuts of meats (ribs, t-bone steak, regular hamburger) Bacon, sausage, or hot dogs Cold cuts, such as salami or bologna, deli meats, cured meats, corned beef Organ meats (liver, brains, gizzards, sweetbreads) Poultry with skin Fried or smoked meat, poultry, and fish Whole eggs and egg yolks (more than 2-4 per week) Salted legumes, nuts, seeds, or nut/seed butters Meat alternatives with high levels of sodium (>300 mg per serving) or saturated fat (>5 g per serving)  Dairy Whole milk,?2% fat milk,  buttermilk Whole milk yogurt or ice cream Cream Half-&-half Cream cheese Sour cream Cheese  Vegetables Canned or frozen vegetables with salt, fresh vegetables prepared with salt, butter, cheese, or cream sauce Fried vegetables Pickled vegetables such as olives, pickles, or sauerkraut  Fruits Fried fruits Fruits served with butter or cream  Oils Butter, stick margarine, shortening Partially hydrogenated oils or trans fats Tropical oils (coconut, palm, palm kernel oils)  Other Candy, sugar sweetened soft drinks and desserts Salt, sea salt, garlic salt, and seasoning mixes containing salt Bouillon cubes Ketchup, barbecue sauce, Worcestershire sauce, soy sauce, teriyaki sauce Miso Salsa Pickles, olives, relish   Heart Healthy Consistent Carbohydrate Vegetarian (Lacto-Ovo) Sample 1-Day Menu  Breakfast 1 cup oatmeal, cooked (2 carbohydrate servings)   cup blueberries (1 carbohydrate serving)  11 almonds, without salt  1 cup 1% milk (1 carbohydrate serving)  1 cup coffee  Morning Snack 1 cup fat-free plain yogurt (1 carbohydrate serving)  Lunch 1 whole wheat bun (1 carbohydrate servings)  1 black bean burger (1 carbohydrate servings)  1 slice cheddar cheese, low sodium  2 slices tomatoes  2 leaves lettuce  1 teaspoon mustard  1 small pear (1 carbohydrate servings)  1 cup green tea, unsweetened  Afternoon Snack 1/3 cup trail mix with nuts, seeds, and raisins, without salt (1 carbohydrate servinga)  Evening Meal  cup meatless chicken  2/3 cup brown rice, cooked (2 carbohydrate servings)  1 cup broccoli, cooked (2/3 carbohydrate serving)   cup carrots, cooked (1/3 carbohydrate serving)  2 teaspoons olive oil  1 teaspoon balsamic vinegar  1 whole wheat dinner roll (1 carbohydrate serving)  1 teaspoon margarine, soft, tub  1 cup 1% milk (1 carbohydrate serving)  Evening Snack 1 extra small banana (1 carbohydrate serving)  1 tablespoon peanut butter   Heart Healthy  Consistent Carbohydrate Vegan Sample 1-Day Menu  Breakfast 1 cup oatmeal, cooked (2 carbohydrate servings)   cup blueberries (1 carbohydrate serving)  11 almonds, without salt  1 cup soymilk fortified with calcium, vitamin B12, and vitamin D  1 cup coffee  Morning Snack 6 ounces soy yogurt (1 carbohydrate servings)  Lunch 1 whole wheat bun(1 carbohydrate servings)  1 black bean burger (1 carbohydrate serving)  2 slices tomatoes  2 leaves lettuce  1 teaspoon mustard  1 small pear (1 carbohydrate servings)  1 cup green tea, unsweetened  Afternoon Snack 1/3 cup trail mix with nuts, seeds, and raisins, without salt (1 carbohydrate servings)  Evening Meal  cup meatless chicken  2/3 cup brown rice, cooked (2 carbohydrate servings)  1 cup broccoli, cooked (2/3 carbohydrate serving)   cup carrots, cooked (1/3 carbohydrate serving)  2 teaspoons olive oil  1 teaspoon balsamic vinegar  1 whole wheat dinner roll (1 carbohydrate serving)  1 teaspoon margarine, soft, tub  1 cup soymilk fortified with calcium, vitamin B12, and vitamin D  Evening Snack 1 extra small banana (1  carbohydrate serving)  1 tablespoon peanut butter    Heart Healthy Consistent Carbohydrate Sample 1-Day Menu  Breakfast 1 cup cooked oatmeal (2 carbohydrate servings)  3/4 cup blueberries (1 carbohydrate serving)  1 ounce almonds  1 cup skim milk (1 carbohydrate serving)  1 cup coffee  Morning Snack 1 cup sugar-free nonfat yogurt (1 carbohydrate serving)  Lunch 2 slices whole-wheat bread (2 carbohydrate servings)  2 ounces lean [place] breast  1 ounce low-fat Swiss cheese  1 teaspoon mustard  1 slice tomato  1 lettuce leaf  1 small pear (1 carbohydrate serving)  1 cup skim milk (1 carbohydrate serving)  Afternoon Snack 1 ounce trail mix with unsalted nuts, seeds, and raisins (1 carbohydrate serving)  Evening Meal 3 ounces salmon  2/3 cup cooked brown rice (2 carbohydrate servings)  1 teaspoon soft margarine   1 cup cooked broccoli with 1/2 cup cooked carrots (1 carbohydrate serving  Carrots, cooked, boiled, drained, without salt  1 cup lettuce  1 teaspoon olive oil with vinegar for dressing  1 small whole grain roll (1 carbohydrate serving)  1 teaspoon soft margarine  1 cup unsweetened tea  Evening Snack 1 extra-small banana (1 carbohydrate serving)  Copyright 2020  [employer]. All rights reserved.

## 2022-06-24 NOTE — ED Notes (Signed)
Pt stated she has high b\p and has not had her meds for a while RN notified of b\p

## 2022-06-24 NOTE — H&P (Addendum)
History and Physical    Courtney Clayton [id number] DOB: 1970-08-22 DOA: 06/23/2022  PCP: Kristen Loader, FNP  Patient coming from: home  I have personally briefly reviewed patient's old medical records in [place]  Chief Complaint: chest pain for intermittently over the lat 1-2 week worse over the last few days  HPI: Courtney Clayton is a 52 y.o. female with medical history significant of essential hypertension, DMII, Asthma, CHFpef , CAD s/p MI who presents to ED with substernal chest pain that has been intermittent over the last 1-2 weeks but worse over the last few days. Patient states that pain is pressure like centration and radiates to her back. She notes associate symptoms of fatigue, sob, nausea, HA and vision changes. She states current episode was  so severe that she came to ED. She also noted that she has not taken any of her medication in the last 3 months due to financial constraints.    ED Course:  Afeb, bp 214/140, hr 92, rr 16 sat 98% on ra  Labs:  NA 138 , K 3.3 , cr 086,  CE3  Wbc:CTH: NAD CTA: IMPRESSION: 1. No evidence of pulmonary embolism or acute cardiopulmonary disease. 2. Hepatic steatosis. 3. No evidence of aneurysmal dilatation, dissection, major vessel occlusion or hemodynamically significant stenosis throughout the thoracic aorta, abdominal aorta and arterial structures throughout the abdomen and pelvis.  Tx cozaar36m, metoprolol 79m, clonidine 0.1 , hydralazine 62m2mv x 1 , asa 324  Review of Systems: As per HPI otherwise 10 point review of systems negative.   Past Medical History:  Diagnosis Date   Asthma    CHF (congestive heart failure) ([facility]  Depression    Diabetes mellitus without complication ([facility]  Hypertension    MI, old     Past Surgical History:  Procedure Laterality Date   ABDOMINAL HYSTERECTOMY     BACK SURGERY     GANGLION CYST EXCISION     HERNIA REPAIR     TONSILLECTOMY       reports that she has  never smoked. She has never used smokeless tobacco. She reports current alcohol use. She reports that she does not use drugs.  Allergies  Allergen Reactions   Sulfa Antibiotics Hives    History reviewed. No pertinent family history.  Prior to Admission medications   Medication Sig Start Date End Date Taking? Authorizing Provider  acetaminophen (TYLENOL) 500 MG tablet Take 1,000 mg by mouth every 6 (six) hours as needed for moderate pain or headache.   Yes [provider]  albuterol (PROVENTIL) (2.5 MG/3ML) 0.083% nebulizer solution Take 3 mLs (2.5 mg total) by nebulization every 2 (two) hours as needed for wheezing. 10/04/21  Yes SamNita SellsD  cloNIDine (CATAPRES) 0.1 MG tablet Take 0.1 mg by mouth 2 (two) times daily.   Yes [provider]  ipratropium-albuterol (DUONEB) 0.5-2.5 (3) MG/3ML SOLN Take 3 mLs by nebulization every 4 (four) hours as needed. Patient taking differently: Take 3 mLs by nebulization every 4 (four) hours as needed (sob/wheezing). 04/29/22  Yes ForJacqlyn LarsenA-C  losartan (COZAAR) 100 MG tablet Take 100 mg by mouth daily.   Yes [provider]  metFORMIN (GLUCOPHAGE) 500 MG tablet Take 1,000 mg by mouth 2 (two) times daily with a meal. 03/20/19  Yes [provider]  Metoprolol Tartrate 75 MG TABS Take 75 tablets by mouth 2 (two) times daily. 03/15/19  Yes [provider]  pioglitazone (ACTOS) 15 MG tablet Take 15 mg by mouth daily. 07/12/21  Yes [provider]  simvastatin (ZOCOR) 20 MG tablet Take 20 mg by mouth at bedtime. 02/11/19  Yes [provider]  temazepam (RESTORIL) 30 MG capsule Take 30 mg by mouth at bedtime as needed for sleep.  02/11/19  Yes [provider]  allopurinol (ZYLOPRIM) 100 MG tablet Take 100 mg by mouth daily. 07/12/21   [provider]  amoxicillin-clavulanate (AUGMENTIN) 875-125 MG tablet Take 1 tablet by mouth every 12 (twelve) hours. Patient not taking:  Reported on 06/24/2022 04/29/22   [name], PA-C  buPROPion (WELLBUTRIN XL) 150 MG 24 hr tablet Take 450 mg by mouth every morning. 07/12/21   [provider]  calcium carbonate (TUMS - DOSED IN MG ELEMENTAL CALCIUM) 500 MG chewable tablet Chew 2 tablets by mouth daily as needed for indigestion or heartburn.    [provider]  cloNIDine (CATAPRES) 0.1 MG tablet Take 1 tablet (0.1 mg total) by mouth 2 (two) times daily. 10/04/21 12/03/21  [name], MD  diclofenac Sodium (VOLTAREN) 1 % GEL Apply 4 g topically 4 (four) times daily. Patient not taking: Reported on 06/24/2022 07/16/20   Lorayne Bender, PA-C  famotidine (PEPCID) 20 MG tablet Take 1 tablet (20 mg total) by mouth 2 (two) times daily. Patient not taking: Reported on 07/10/2019 04/01/19   Muthersbaugh, Jarrett Soho, PA-C  hydrocortisone 2.5 % lotion Apply topically 2 (two) times daily. Patient not taking: Reported on 04/15/2019 04/01/19   Muthersbaugh, Jarrett Soho, PA-C  lidocaine (LIDODERM) 5 % Place 1 patch onto the skin daily. Remove & Discard patch within 12 hours or as directed by MD Patient not taking: Reported on 10/03/2021 07/05/21   Blue, Soijett A, PA-C  methocarbamol (ROBAXIN) 500 MG tablet Take 1 tablet (500 mg total) by mouth 2 (two) times daily. Patient not taking: Reported on 10/03/2021 07/05/21   Blue, Soijett A, PA-C  Peak Flow Meter DEVI 1 Device by Does not apply route as needed. 10/04/21   [name], MD    Physical Exam: Vitals:   06/24/22 0130 06/24/22 0228 06/24/22 0230 06/24/22 0245  BP: (!) 208/115  (!) 177/93 (!) 173/91  Pulse: 70  79 74  Resp: 20  (!) 22 (!) 24  Temp:  98.1 F (36.7 C)    TempSrc:  Oral    SpO2: 95%  96% 96%    Constitutional: NAD, calm, comfortable,appears fatigued Vitals:   06/24/22 0130 06/24/22 0228 06/24/22 0230 06/24/22 0245  BP: (!) 208/115  (!) 177/93 (!) 173/91  Pulse: 70  79 74  Resp: 20  (!) 22 (!) 24  Temp:  98.1 F (36.7 C)    TempSrc:  Oral     SpO2: 95%  96% 96%   Eyes: PERRL, lids and conjunctivae normal ENMT: Mucous membranes are moist. Posterior pharynx clear of any exudate or lesions.Normal dentition.  Neck: normal, supple, no masses, no thyromegaly Respiratory: clear to auscultation bilaterally, no wheezing, no crackles. Normal respiratory effort. No accessory muscle use.  Cardiovascular: Regular rate and rhythm, no murmurs / rubs / gallops. + extremity edema. 2+ pedal pulses.  Abdomen: no tenderness, no masses palpated. No hepatosplenomegaly. Bowel sounds positive.  Musculoskeletal: no clubbing / cyanosis. No joint deformity upper and lower extremities. Good ROM, no contractures. Normal muscle tone.  Skin: no rashes, lesions, ulcers. No induration Neurologic: CN 2-12 grossly intact. Sensation intact,  Strength 5/5 in all 4.  Psychiatric: Normal judgment and  insight. Alert and oriented x 3. Normal mood.    Labs on Admission: I have personally reviewed following labs and imaging studies  CBC: Recent Labs  Lab 06/23/22 0027  WBC 9.7  HGB 13.0  HCT 41.5  MCV 83.8  PLT [id number]   Basic Metabolic Panel: Recent Labs  Lab 06/23/22 0027  NA 138  K 3.3*  CL 102  CO2 26  GLUCOSE 189*  BUN 11  CREATININE 0.86  CALCIUM 9.1   GFR: CrCl cannot be calculated (Unknown ideal weight.). Liver Function Tests: Recent Labs  Lab 06/23/22 0027  AST 26  ALT 17  ALKPHOS 66  BILITOT 0.4  PROT 7.6  ALBUMIN 3.8   No results for input(s): "LIPASE", "AMYLASE" in the last 168 hours. No results for input(s): "AMMONIA" in the last 168 hours. Coagulation Profile: No results for input(s): "INR", "PROTIME" in the last 168 hours. Cardiac Enzymes: No results for input(s): "CKTOTAL", "CKMB", "CKMBINDEX", "TROPONINI" in the last 168 hours. BNP (last 3 results) No results for input(s): "PROBNP" in the last 8760 hours. HbA1C: No results for input(s): "HGBA1C" in the last 72 hours. CBG: No results for input(s): "GLUCAP" in the last  168 hours. Lipid Profile: No results for input(s): "CHOL", "HDL", "LDLCALC", "TRIG", "CHOLHDL", "LDLDIRECT" in the last 72 hours. Thyroid Function Tests: No results for input(s): "TSH", "T4TOTAL", "FREET4", "T3FREE", "THYROIDAB" in the last 72 hours. Anemia Panel: No results for input(s): "VITAMINB12", "FOLATE", "FERRITIN", "TIBC", "IRON", "RETICCTPCT" in the last 72 hours. Urine analysis: No results found for: "COLORURINE", "APPEARANCEUR", "LABSPEC", "PHURINE", "GLUCOSEU", "HGBUR", "BILIRUBINUR", "KETONESUR", "PROTEINUR", "UROBILINOGEN", "NITRITE", "LEUKOCYTESUR"  Radiological Exams on Admission: CT Angio Chest/Abd/Pel for Dissection W and/or Wo Contrast  Result Date: 06/24/2022 CLINICAL DATA:  History of hypertension, presenting with headache and intermittent chest pain x1 week. EXAM: CT ANGIOGRAPHY CHEST, ABDOMEN AND PELVIS TECHNIQUE: Non-contrast CT of the chest was initially obtained. Multidetector CT imaging through the chest, abdomen and pelvis was performed using the standard protocol during bolus administration of intravenous contrast. Multiplanar reconstructed images and MIPs were obtained and reviewed to evaluate the vascular anatomy. RADIATION DOSE REDUCTION: This exam was performed according to the departmental dose-optimization program which includes automated exposure control, adjustment of the mA and/or kV according to patient size and/or use of iterative reconstruction technique. CONTRAST:  150m OMNIPAQUE IOHEXOL 350 MG/ML SOLN COMPARISON:  None Available. FINDINGS: CTA CHEST FINDINGS Cardiovascular: The thoracic aorta is normal in appearance, without evidence of aneurysmal dilatation or dissection. Satisfactory opacification of the pulmonary arteries to the segmental level. No evidence of pulmonary embolism. Normal heart size. No pericardial effusion. Mediastinum/Nodes: No enlarged mediastinal, hilar, or axillary lymph nodes. Thyroid gland, trachea, and esophagus demonstrate no  significant findings. Lungs/Pleura: Lungs are clear. No pleural effusion or pneumothorax. Musculoskeletal: No chest wall abnormality. No acute or significant osseous findings. Review of the MIP images confirms the above findings. CTA ABDOMEN AND PELVIS FINDINGS VASCULAR Aorta: Normal caliber aorta without aneurysm, dissection, vasculitis or significant stenosis. Celiac: Patent without evidence of aneurysm, dissection, vasculitis or significant stenosis. SMA: Patent without evidence of aneurysm, dissection, vasculitis or significant stenosis. Renals: Both renal arteries are patent without evidence of aneurysm, dissection, vasculitis, fibromuscular dysplasia or significant stenosis. IMA: Patent without evidence of aneurysm, dissection, vasculitis or significant stenosis. Inflow: Patent without evidence of aneurysm, dissection, vasculitis or significant stenosis. Veins: No obvious venous abnormality within the limitations of this arterial phase study. Review of the MIP images confirms the above findings. NON-VASCULAR Hepatobiliary: There is diffuse fatty infiltration  of the liver parenchyma. No focal liver abnormality is seen. No gallstones, gallbladder wall thickening, or biliary dilatation. Pancreas: Unremarkable. No pancreatic ductal dilatation or surrounding inflammatory changes. Spleen: Normal in size without focal abnormality. Adrenals/Urinary Tract: Adrenal glands are unremarkable. Kidneys are normal, without obstructing renal calculi, focal lesion, or hydronephrosis. Bladder is unremarkable. Stomach/Bowel: Stomach is within normal limits. Appendix appears normal. No evidence of bowel wall thickening, distention, or inflammatory changes. Lymphatic: No abnormal abdominal or pelvic lymph nodes are identified. Reproductive: Status post hysterectomy. No adnexal masses. Other: No abdominal wall hernia or abnormality. No abdominopelvic ascites. Musculoskeletal: No acute or significant osseous findings. Review of the  MIP images confirms the above findings. IMPRESSION: 1. No evidence of pulmonary embolism or acute cardiopulmonary disease. 2. Hepatic steatosis. 3. No evidence of aneurysmal dilatation, dissection, major vessel occlusion or hemodynamically significant stenosis throughout the thoracic aorta, abdominal aorta and arterial structures throughout the abdomen and pelvis. Electronically Signed   By: Virgina Norfolk M.D.   On: 06/24/2022 02:05   CT Head Wo Contrast  Result Date: 06/24/2022 CLINICAL DATA:  History of hypertension, presenting with headache and intermittent chest pain x1 week. EXAM: CT HEAD WITHOUT CONTRAST TECHNIQUE: Contiguous axial images were obtained from the base of the skull through the vertex without intravenous contrast. RADIATION DOSE REDUCTION: This exam was performed according to the departmental dose-optimization program which includes automated exposure control, adjustment of the mA and/or kV according to patient size and/or use of iterative reconstruction technique. COMPARISON:  March 02, 2021 FINDINGS: Brain: No evidence of acute infarction, hemorrhage, hydrocephalus, extra-axial collection or mass lesion/mass effect. Vascular: No hyperdense vessel or unexpected calcification. Skull: Normal. Negative for fracture or focal lesion. Sinuses/Orbits: No acute finding. Other: None. IMPRESSION: No acute intracranial pathology. Electronically Signed   By: Virgina Norfolk M.D.   On: 06/24/2022 02:01   DG Chest 2 View  Result Date: 06/23/2022 CLINICAL DATA:  Chest pressure and dyspnea EXAM: CHEST - 2 VIEW COMPARISON:  Chest x-ray 06/23/2022 FINDINGS: The heart size and mediastinal contours are within normal limits. Both lungs are clear. The visualized skeletal structures are unremarkable. IMPRESSION: No active cardiopulmonary disease. Electronically Signed   By: Ronney Asters M.D.   On: 06/23/2022 23:47    EKG: Independently reviewed.   Assessment/Plan  Hypertensive Urgency -in  setting of noncompliance with medications  - resume home cozaar and metoprolol  -previously on clonidine , consider other medication if need due to history of intermittent compliance -prn hydralazine  Chest pain  -hx of CAD with MI -intial CE neg  -cycle ce  -echo in am  -asa daily -de-escalate therapy as able   Hypokalemia -replete prn    DMII -check A1C -resume metformin  Asthma  -no acute exacerbation  -prn nebs   Obesity    Depression -has not taken medication in 3 mo  -will need to f/u with pcp re resumption of treatment   Gout -previously on allopurinol -check uric acid    DVT prophylaxis: SCD Code Status: full/ as discussed per patient wishes in event of cardiac arrest  Family Communication: none at bedside Disposition Plan: patient  expected to be admitted greater than 2 midnights  Consults called: n/a Admission status: progressive   Clance Boll MD [facility]   If 7PM-7AM, please contact night-coverage [email or web address] Password [facility]  06/24/2022, 3:42 AM

## 2022-06-24 NOTE — ED Notes (Signed)
RN said the CBG does not need to be collected

## 2022-06-24 NOTE — Care Management (Addendum)
Transition of Care [facility]) - Emergency Department Mini Assessment   Patient Details  Name: Courtney Clayton MRN: [id number] Date of Birth: 1971/05/02  Transition of Care [facility]) CM/SW Contact:    Roseanne Kaufman, RN Phone Number: 06/24/2022, 4:03 PM   Clinical Narrative: This RNCM consulted for medication assistance and mental health resources. This RNCM spoke with patient at bedside who reports currently her job doesn't offer medical insurance. Patient reports she no longer sees the PCP on file Jillyn Ledger, NP due to $125 per visit cost and unable to afford at this time. This RNCM shared information regarding [place]. Patient does qualify for [place] and confirms she can afford the $3 copay per medication. [place] letter given to patient at bedside.  Patient reports EDP advise she will have medication delivered by pharmacy to bed. This RNCM faxed a copy of [place] letter to [place]. This RNCM attached outpatient [place] resources to AVS.    Transportation at discharge: daughter   No additional TOC needs at this time.  -5:34p NOTE: [facility] closes at 6pm, iun an effort to meds after 6pm the EDP may want to send to [facility].    No additional TOC needs    ED Mini Assessment: What brought you to the Emergency Department? : intermittent chest pain and without blood pressure medications for one- two months  Barriers to Discharge: Continued Medical Work up  [employer] interventions: medication assistance and provided with PCP information to schedule PCP appoitnment and mental health counseling  Means of departure: Car  Interventions which prevented an admission or readmission: Medication Review, OP BH Referrals for Interventions    Patient Contact and Communications        ,          Patient states their goals for this hospitalization and ongoing recovery are:: get assistance with medications and resources for mental health services CMS  Medicare.gov Compare Post Acute Care list provided to:: Patient Choice offered to / list presented to : Patient  Admission diagnosis:  Hypertensive urgency [I16.0] Patient Active Problem List   Diagnosis Date Noted   Asthma 10/03/2021   Chest pain 10/03/2021   Obesity, Class III, BMI 40-49.9 (morbid obesity) ([place]) 10/03/2021   Hypertensive urgency 10/03/2021   Hypokalemia 10/03/2021   Headache 10/03/2021   Controlled type 2 diabetes mellitus without complication, without long-term current use of insulin ([place]) 10/03/2021   Depression 10/03/2021   PCP:  Kristen Loader, FNP Pharmacy:   [facility], [place] [address] Phone: [phone] Fax: [phone]  [facility] ([place]), [place] - [address] [place] [address] Phone: [phone] Fax: [place] [address] Phone: [phone] Fax: [phone]

## 2022-06-24 NOTE — Discharge Summary (Signed)
Physician Discharge Summary  Courtney Clayton [id number] DOB: 1970-12-12 DOA: 06/23/2022  PCP: Kristen Loader, FNP  Admit date: 06/23/2022 Discharge date: 06/24/2022 Discharging to: home Recommendations for Outpatient Follow-up:  Patient needs affordable medications and continued f/u regarding weight loss and mental health     Discharge Diagnoses:   Principal Problem:   Hypertensive urgency Active Problems:   Asthma   Controlled type 2 diabetes mellitus without complication, without long-term current use of insulin (HCC)   Depression   Obesity, Class III, BMI 40-49.9 (morbid obesity) [facility])     Hospital Course:  Courtney Clayton, is a 52 year old female with essential hypertension, type 2 diabetes mellitus, asthma, morbid obesity presents to the ED with worsening substernal chest pain over the last few days, accompanied by fatigue, shortness of breath, nausea, headache, and vision changes. The pain is pressure-like, centration, and radiates to her back. She reports not taking medications for the past 3 months because he has no insurance and seeing a doctor costs too much money. She called the doctor's office to ask for refills but was told she would have to come back to be evaluated before refills could be given.    Principal Problem:   Hypertensive urgency - due to not being able to obtain meds and having stress from work - dc home with meds listed below - SW consulted to assist with finding an affordable PCP and giving a supply of meds - dietary consult for low sodium diet  Active Problems:    Controlled type 2 diabetes mellitus without complication, without long-term current use of insulin ([place]) - resume Metformin and Actos - dietician consulted    Depression/ job related stress - resume Wellbutrin- I have given her a number for crisis hot line- SW Asked to help her with a local referral for mental health    Obesity, Class III, BMI 40-49.9 (morbid obesity) ([place]) -  as  mentioned, dietary consult - if she finds an affordable PCP, she may be able to get a GLP 1  Gout - resume Allopurinol   Insomnia - stop Temazepam- start Ambien  Dyslipidemia - resume statin- dietician consulted      Asthma - she has an inhaler and a nebulizer to use PRN - she may need an antihistamine in the spring to prevent a flare and I will add this      Discharge Instructions   Allergies as of 06/24/2022       Reactions   Sulfa Antibiotics Hives        Medication List     STOP taking these medications    amoxicillin-clavulanate 875-125 MG tablet Commonly known as: AUGMENTIN   cloNIDine 0.1 MG tablet Commonly known as: CATAPRES   diclofenac Sodium 1 % Gel Commonly known as: VOLTAREN   famotidine 20 MG tablet Commonly known as: Pepcid   hydrocortisone 2.5 % lotion   lidocaine 5 % Commonly known as: Lidoderm   methocarbamol 500 MG tablet Commonly known as: ROBAXIN   temazepam 30 MG capsule Commonly known as: RESTORIL       TAKE these medications    acetaminophen 500 MG tablet Commonly known as: TYLENOL Take 1,000 mg by mouth every 6 (six) hours as needed for moderate pain or headache.   albuterol (2.5 MG/3ML) 0.083% nebulizer solution Commonly known as: PROVENTIL Take 3 mLs (2.5 mg total) by nebulization every 2 (two) hours as needed for wheezing.   allopurinol 100 MG tablet Commonly known as: ZYLOPRIM Take 1 tablet (  100 mg total) by mouth daily.   amLODipine 10 MG tablet Commonly known as: NORVASC Take 1 tablet (10 mg total) by mouth daily.   Blood Glucose Monitor System w/Device Kit Use to test blood sugar in the morning, at noon, and at bedtime.   buPROPion 150 MG 24 hr tablet Commonly known as: WELLBUTRIN XL Take 3 tablets (450 mg total) by mouth every morning.   calcium carbonate 500 MG chewable tablet Commonly known as: TUMS - dosed in mg elemental calcium Chew 2 tablets by mouth daily as needed for indigestion or  heartburn.   cetirizine 10 MG tablet Commonly known as: ZyrTEC Allergy Take 1 tablet (10 mg total) by mouth at bedtime as needed for up to 30 doses for allergies or rhinitis.   freestyle lancets Test blood sugar in the morning, at noon, and at bedtime.   glucose blood test strip Test blood sugar in the morning, at noon, and at bedtime.   ipratropium-albuterol 0.5-2.5 (3) MG/3ML Soln Commonly known as: DUONEB Take 3 mLs by nebulization every 4 (four) hours as needed. What changed: reasons to take this   Lancet Device Misc 1 each by Does not apply route in the morning, at noon, and at bedtime. May substitute to any manufacturer covered by patient's insurance.   losartan 100 MG tablet Commonly known as: COZAAR Take 1 tablet (100 mg total) by mouth daily.   metFORMIN 500 MG tablet Commonly known as: GLUCOPHAGE Take 2 tablets by mouth 2 times daily with a meal.   Metoprolol Tartrate 75 MG Tabs Take 1 tablet (75 mg total) by mouth 2 (two) times daily. What changed: how much to take   Peak Flow Meter Devi 1 Device by Does not apply route as needed.   pioglitazone 15 MG tablet Commonly known as: ACTOS Take 1 tablet (15 mg total) by mouth daily.   simvastatin 20 MG tablet Commonly known as: ZOCOR Take 1 tablet (20 mg total) by mouth at bedtime.   zolpidem 12.5 MG CR tablet Commonly known as: Ambien CR Take 1 tablet by mouth at bedtime as needed for sleep.            The results of significant diagnostics from this hospitalization (including imaging, microbiology, ancillary and laboratory) are listed below for reference.    CT Angio Chest/Abd/Pel for Dissection W and/or Wo Contrast  Result Date: 06/24/2022 CLINICAL DATA:  History of hypertension, presenting with headache and intermittent chest pain x1 week. EXAM: CT ANGIOGRAPHY CHEST, ABDOMEN AND PELVIS TECHNIQUE: Non-contrast CT of the chest was initially obtained. Multidetector CT imaging through the chest, abdomen  and pelvis was performed using the standard protocol during bolus administration of intravenous contrast. Multiplanar reconstructed images and MIPs were obtained and reviewed to evaluate the vascular anatomy. RADIATION DOSE REDUCTION: This exam was performed according to the departmental dose-optimization program which includes automated exposure control, adjustment of the mA and/or kV according to patient size and/or use of iterative reconstruction technique. CONTRAST:  157m OMNIPAQUE IOHEXOL 350 MG/ML SOLN COMPARISON:  None Available. FINDINGS: CTA CHEST FINDINGS Cardiovascular: The thoracic aorta is normal in appearance, without evidence of aneurysmal dilatation or dissection. Satisfactory opacification of the pulmonary arteries to the segmental level. No evidence of pulmonary embolism. Normal heart size. No pericardial effusion. Mediastinum/Nodes: No enlarged mediastinal, hilar, or axillary lymph nodes. Thyroid gland, trachea, and esophagus demonstrate no significant findings. Lungs/Pleura: Lungs are clear. No pleural effusion or pneumothorax. Musculoskeletal: No chest wall abnormality. No acute or significant osseous findings.  Review of the MIP images confirms the above findings. CTA ABDOMEN AND PELVIS FINDINGS VASCULAR Aorta: Normal caliber aorta without aneurysm, dissection, vasculitis or significant stenosis. Celiac: Patent without evidence of aneurysm, dissection, vasculitis or significant stenosis. SMA: Patent without evidence of aneurysm, dissection, vasculitis or significant stenosis. Renals: Both renal arteries are patent without evidence of aneurysm, dissection, vasculitis, fibromuscular dysplasia or significant stenosis. IMA: Patent without evidence of aneurysm, dissection, vasculitis or significant stenosis. Inflow: Patent without evidence of aneurysm, dissection, vasculitis or significant stenosis. Veins: No obvious venous abnormality within the limitations of this arterial phase study. Review of  the MIP images confirms the above findings. NON-VASCULAR Hepatobiliary: There is diffuse fatty infiltration of the liver parenchyma. No focal liver abnormality is seen. No gallstones, gallbladder wall thickening, or biliary dilatation. Pancreas: Unremarkable. No pancreatic ductal dilatation or surrounding inflammatory changes. Spleen: Normal in size without focal abnormality. Adrenals/Urinary Tract: Adrenal glands are unremarkable. Kidneys are normal, without obstructing renal calculi, focal lesion, or hydronephrosis. Bladder is unremarkable. Stomach/Bowel: Stomach is within normal limits. Appendix appears normal. No evidence of bowel wall thickening, distention, or inflammatory changes. Lymphatic: No abnormal abdominal or pelvic lymph nodes are identified. Reproductive: Status post hysterectomy. No adnexal masses. Other: No abdominal wall hernia or abnormality. No abdominopelvic ascites. Musculoskeletal: No acute or significant osseous findings. Review of the MIP images confirms the above findings. IMPRESSION: 1. No evidence of pulmonary embolism or acute cardiopulmonary disease. 2. Hepatic steatosis. 3. No evidence of aneurysmal dilatation, dissection, major vessel occlusion or hemodynamically significant stenosis throughout the thoracic aorta, abdominal aorta and arterial structures throughout the abdomen and pelvis. Electronically Signed   By: Virgina Norfolk M.D.   On: 06/24/2022 02:05   CT Head Wo Contrast  Result Date: 06/24/2022 CLINICAL DATA:  History of hypertension, presenting with headache and intermittent chest pain x1 week. EXAM: CT HEAD WITHOUT CONTRAST TECHNIQUE: Contiguous axial images were obtained from the base of the skull through the vertex without intravenous contrast. RADIATION DOSE REDUCTION: This exam was performed according to the departmental dose-optimization program which includes automated exposure control, adjustment of the mA and/or kV according to patient size and/or use of  iterative reconstruction technique. COMPARISON:  March 02, 2021 FINDINGS: Brain: No evidence of acute infarction, hemorrhage, hydrocephalus, extra-axial collection or mass lesion/mass effect. Vascular: No hyperdense vessel or unexpected calcification. Skull: Normal. Negative for fracture or focal lesion. Sinuses/Orbits: No acute finding. Other: None. IMPRESSION: No acute intracranial pathology. Electronically Signed   By: Virgina Norfolk M.D.   On: 06/24/2022 02:01   DG Chest 2 View  Result Date: 06/23/2022 CLINICAL DATA:  Chest pressure and dyspnea EXAM: CHEST - 2 VIEW COMPARISON:  Chest x-ray 06/23/2022 FINDINGS: The heart size and mediastinal contours are within normal limits. Both lungs are clear. The visualized skeletal structures are unremarkable. IMPRESSION: No active cardiopulmonary disease. Electronically Signed   By: Ronney Asters M.D.   On: 06/23/2022 23:47   Labs:   Basic Metabolic Panel: Recent Labs  Lab 06/23/22 0027 06/24/22 0440  NA 138 138  K 3.3* 3.7  CL 102 102  CO2 26 28  GLUCOSE 189* 165*  BUN 11 11  CREATININE 0.86 0.83  CALCIUM 9.1 9.0  MG  --  2.1     CBC: Recent Labs  Lab 06/23/22 0027 06/24/22 0440  WBC 9.7 9.1  HGB 13.0 12.2  HCT 41.5 40.1  MCV 83.8 83.9  PLT 289 250         SIGNED:   Debbe Odea,  MD  [facility] 06/24/2022, 1:05 PM

## 2022-06-24 NOTE — Progress Notes (Signed)
Nutrition Brief Note  RD carrying on call pager. Received first page around 1500 and again around 1700. Attempted to return call x2 however no answer after multiple rings. Attempted to return call a third time and was transferred to ED RN.    Unfortunately, this RD covers [facility] and consult for diet education was unknown to this Probation officer. Uncertain if [facility] RD's were paged earlier in the day.   Informed ED RN that RD's don't typically cover or see patients in ED. Informed RN that RD will add Heart Healthy/Diabetes nutrition education handout to AVS and enter referral for ongoing outpatient diet education. Secure chat sent to RN to inform her that this has been completed.    Clayborne Dana, RDN, LDN Clinical Nutrition

## 2022-06-24 NOTE — ED Notes (Signed)
RN is aware of pt's B\P

## 2022-06-24 NOTE — ED Notes (Signed)
Per MD, patient can be discharged once dietician and TOC comes to see patient to have her set up with PCP and Mental Health. Pt verbalized understanding on what we are waiting for in order to be discharged.

## 2022-06-24 NOTE — Progress Notes (Signed)
Echocardiogram 2D Echocardiogram has been performed.  Courtney Clayton 06/24/2022, 2:25 PM

## 2022-06-24 NOTE — ED Notes (Signed)
Assumed care of patient. Patient resting comfortably in bed with no signs of acute distress noted. Waiting on hospital bed upstairs.

## 2022-06-24 NOTE — ED Notes (Signed)
CBG - 141 ° °

## 2022-06-24 NOTE — Care Management (Signed)
  [place] Medication Assistance Card Name: Courtney Clayton ID (MRN): [id number] [place]: [id number] RX Group: [id number] Discharge Date: 06/24/2022 Expiration Date:v2/23/2024                                           (must be filled within 7 days of discharge)     You have been approved to have the prescriptions written by your discharging physician filled through our [facility] (Medication Assistance Through [facility]) program. This program allows for a one-time (no refills) 34-day supply of selected medications for a low copay amount.  The copay is $3.00 per prescription. For instance, if you have one prescription, you will pay $3.00; for two prescriptions, you pay $6.00; for three prescriptions, you pay $9.00; and so on.  Only certain pharmacies are participating in this program with [facility]. You will need to select one of the pharmacies from the attached list and take your prescriptions, this letter, and your photo ID to one of the [place] pharmacies, [facility], [facility] [address] or [facility] [address].   We are excited that you are able to use the [facility] program to get your medications. These prescriptions must be filled within 7 days of hospital discharge or they will no longer be valid for the [facility] program. Should you have any problems with your prescriptions please contact your case management team member at [phone] for [facility]/ [place] you, [place] Management

## 2022-06-25 ENCOUNTER — Other Ambulatory Visit (HOSPITAL_COMMUNITY): Payer: Self-pay

## 2022-07-13 ENCOUNTER — Other Ambulatory Visit (HOSPITAL_COMMUNITY): Payer: Self-pay

## 2022-07-13 ENCOUNTER — Encounter: Payer: Self-pay | Admitting: Student

## 2022-07-13 ENCOUNTER — Ambulatory Visit: Payer: Self-pay | Admitting: Student

## 2022-07-13 VITALS — BP 164/95 | HR 68 | Temp 98.0°F | Ht 65.0 in | Wt 308.6 lb

## 2022-07-13 DIAGNOSIS — I10 Essential (primary) hypertension: Secondary | ICD-10-CM | POA: Insufficient documentation

## 2022-07-13 DIAGNOSIS — Z7984 Long term (current) use of oral hypoglycemic drugs: Secondary | ICD-10-CM

## 2022-07-13 DIAGNOSIS — Z6841 Body Mass Index (BMI) 40.0 and over, adult: Secondary | ICD-10-CM

## 2022-07-13 DIAGNOSIS — F32A Depression, unspecified: Secondary | ICD-10-CM

## 2022-07-13 DIAGNOSIS — E1169 Type 2 diabetes mellitus with other specified complication: Secondary | ICD-10-CM | POA: Insufficient documentation

## 2022-07-13 DIAGNOSIS — E119 Type 2 diabetes mellitus without complications: Secondary | ICD-10-CM

## 2022-07-13 DIAGNOSIS — E785 Hyperlipidemia, unspecified: Secondary | ICD-10-CM | POA: Insufficient documentation

## 2022-07-13 MED ORDER — AMLODIPINE BESYLATE 10 MG PO TABS
10.0000 mg | ORAL_TABLET | Freq: Every day | ORAL | 3 refills | Status: DC
  Filled 2022-07-13 – 2022-08-31 (×2): qty 30, 30d supply, fill #0
  Filled 2022-11-03: qty 30, 30d supply, fill #1

## 2022-07-13 MED ORDER — EMPAGLIFLOZIN 10 MG PO TABS
10.0000 mg | ORAL_TABLET | Freq: Every day | ORAL | 3 refills | Status: DC
  Filled 2022-07-13: qty 30, 30d supply, fill #0
  Filled 2022-08-31: qty 30, 30d supply, fill #1
  Filled 2022-11-03: qty 30, 30d supply, fill #2

## 2022-07-13 MED ORDER — SIMVASTATIN 20 MG PO TABS
20.0000 mg | ORAL_TABLET | Freq: Every day | ORAL | 0 refills | Status: DC
  Filled 2022-07-13: qty 90, 90d supply, fill #0
  Filled 2022-08-31: qty 30, 30d supply, fill #0
  Filled 2022-11-03: qty 30, 30d supply, fill #1

## 2022-07-13 MED ORDER — BUPROPION HCL ER (XL) 150 MG PO TB24
450.0000 mg | ORAL_TABLET | Freq: Every morning | ORAL | 3 refills | Status: DC
  Filled 2022-07-13 – 2022-08-31 (×2): qty 90, 30d supply, fill #0
  Filled 2022-11-03: qty 90, 30d supply, fill #1

## 2022-07-13 MED ORDER — METOPROLOL TARTRATE 75 MG PO TABS
75.0000 mg | ORAL_TABLET | Freq: Two times a day (BID) | ORAL | 3 refills | Status: DC
  Filled 2022-07-13 – 2022-09-17 (×3): qty 60, 30d supply, fill #0
  Filled 2022-11-03 (×2): qty 60, 30d supply, fill #1

## 2022-07-13 MED ORDER — METFORMIN HCL 500 MG PO TABS
1000.0000 mg | ORAL_TABLET | Freq: Two times a day (BID) | ORAL | 3 refills | Status: DC
  Filled 2022-07-13 – 2022-08-31 (×2): qty 120, 30d supply, fill #0
  Filled 2022-11-03: qty 120, 30d supply, fill #1

## 2022-07-13 MED ORDER — HYDROCHLOROTHIAZIDE 12.5 MG PO TABS
12.5000 mg | ORAL_TABLET | Freq: Every day | ORAL | 3 refills | Status: DC
  Filled 2022-07-13: qty 30, 30d supply, fill #0
  Filled 2022-09-17: qty 30, 30d supply, fill #1
  Filled 2022-11-03: qty 30, 30d supply, fill #2

## 2022-07-13 MED ORDER — LOSARTAN POTASSIUM 100 MG PO TABS
100.0000 mg | ORAL_TABLET | Freq: Every day | ORAL | 3 refills | Status: DC
  Filled 2022-07-13 – 2022-08-31 (×2): qty 30, 30d supply, fill #0
  Filled 2022-11-03: qty 30, 30d supply, fill #1

## 2022-07-13 NOTE — Assessment & Plan Note (Signed)
Patient with a BMI of 51.35.  Weight currently is 308 pounds (140 kg).  She has multiple comorbidities such as hypertension and type 2 diabetes.  I have informed patient about the negative outcomes associated with having a BMI at this high and she is agreeable to start exercising and controlling her diet better.  Her STOP-BANG score is currently 4 which puts her at intermediate risk for obstructive sleep apnea as she does endorse snoring at night, as well as daytime somnolence.  Given her lack of insurance, will be difficult to get her in for a sleep study.  She is currently working on [employer] and switching jobs.

## 2022-07-13 NOTE — Assessment & Plan Note (Signed)
Patient's last A1c done when she was in the hospital was 6.9%.  Her current regimen is metformin 1000 mg twice daily, as well as Actos 15 mg.  Of note, patient's BMI is also 51.35.  Do not believe Actos is the best medication for her, and she has a history of heart failure.  Will start her on an SGLT2 and stop the Actos.  Plan: - Start Jardiance 10 mg, can titrate up if necessary at next visit.

## 2022-07-13 NOTE — Patient Instructions (Signed)
Thank you so much for coming to the clinic today!   I have refilled your medications. You should stop taking Actos, and instead take the jardiance. I have also added another medication called Hydrochlorothiazide for your blood pressure. Please continue checking your blood pressure at home, and bring a log of your daily checks when you return. I'd like to see you back in a month to check some labs.   If you have any questions please feel free to the call the clinic at anytime at [phone]. It was a pleasure seeing you!  Best, Dr. Sanjuana Mae

## 2022-07-13 NOTE — Assessment & Plan Note (Addendum)
Patient's last LDL done in the hospital was 164.  She is currently on simvastatin 20 mg.  ASCVD risk is 23%.  Will continue simvastatin.  Can consider switching to atorvastatin 40 mg at next visit.

## 2022-07-13 NOTE — Assessment & Plan Note (Signed)
Patient with history of depression, for which she takes Wellbutrin for.  She states this has been helping her significantly, will refill.

## 2022-07-13 NOTE — Progress Notes (Unsigned)
CC: Establishing care  HPI:  Courtney Clayton is a 52 y.o. female living with a history stated below and presents today for establishing care. Please see problem based assessment and plan for additional details.  Past Medical History:  Diagnosis Date   Asthma    CHF (congestive heart failure) ([place])    Depression    Diabetes mellitus without complication ([place])    Hypertension    Hypertensive urgency 10/03/2021   Hypertensive urgency 10/03/2021   MI, old     Current Outpatient Medications on File Prior to Visit  Medication Sig Dispense Refill   albuterol (PROVENTIL) (2.5 MG/3ML) 0.083% nebulizer solution Take 3 mLs (2.5 mg total) by nebulization every 2 (two) hours as needed for wheezing. 75 mL 12   allopurinol (ZYLOPRIM) 100 MG tablet Take 1 tablet (100 mg total) by mouth daily. 90 tablet 0   Blood Glucose Monitoring Suppl (BLOOD GLUCOSE MONITOR SYSTEM) w/Device KIT Use to test blood sugar in the morning, at noon, and at bedtime. 1 kit 0   calcium carbonate (TUMS - DOSED IN MG ELEMENTAL CALCIUM) 500 MG chewable tablet Chew 2 tablets by mouth daily as needed for indigestion or heartburn.     cetirizine (ZYRTEC ALLERGY) 10 MG tablet Take 1 tablet (10 mg total) by mouth at bedtime as needed for up to 30 doses for allergies or rhinitis. 30 tablet 0   glucose blood test strip Test blood sugar in the morning, at noon, and at bedtime. 100 each 0   ipratropium-albuterol (DUONEB) 0.5-2.5 (3) MG/3ML SOLN Take 3 mLs by nebulization every 4 (four) hours as needed. (Patient taking differently: Take 3 mLs by nebulization every 4 (four) hours as needed (sob/wheezing).) 360 mL 0   Lancet Device MISC 1 each by Does not apply route in the morning, at noon, and at bedtime. May substitute to any manufacturer covered by patient's insurance. 1 each 0   Lancets (FREESTYLE) lancets Test blood sugar in the morning, at noon, and at bedtime. 100 each 0   Peak Flow Meter DEVI 1 Device by Does not apply route  as needed. 1 each 0   zolpidem (AMBIEN CR) 12.5 MG CR tablet Take 1 tablet by mouth at bedtime as needed for sleep. 30 tablet 3   No current facility-administered medications on file prior to visit.    No family history on file.  Social History   Socioeconomic History   Marital status: Divorced    Spouse name: Not on file   Number of children: Not on file   Years of education: Not on file   Highest education level: Not on file  Occupational History   Not on file  Tobacco Use   Smoking status: Never   Smokeless tobacco: Never  Vaping Use   Vaping Use: Never used  Substance and Sexual Activity   Alcohol use: Not Currently    Comment: rarely   Drug use: Never   Sexual activity: Not on file  Other Topics Concern   Not on file  Social History Narrative   Not on file   Social Determinants of Health   Financial Resource Strain: Not on file  Food Insecurity: Not on file  Transportation Needs: Not on file  Physical Activity: Not on file  Stress: Not on file  Social Connections: Not on file  Intimate Partner Violence: Not on file    Review of Systems: ROS negative except for what is noted on the assessment and plan.  Vitals:  07/13/22 1014 07/13/22 1032  BP: (!) 157/85 (!) 164/95  Pulse: 71 68  Temp: 98 F (36.7 C)   TempSrc: Oral   SpO2: 100%   Weight: (!) 308 lb 9.6 oz (140 kg)   Height: '5\' 5"'$  (1.651 m)     Physical Exam: Constitutional: Morbidly obese woman,  in no acute distress HENT: normocephalic atraumatic, mucous membranes moist Eyes: conjunctiva non-erythematous Neck: supple Cardiovascular: regular rate and rhythm, no m/r/g Pulmonary/Chest: normal work of breathing on room air, lungs clear to auscultation bilaterally Abdominal: soft, non-tender, non-distended MSK: normal bulk and tone Neurological: alert & oriented x 3, 5/5 strength in bilateral upper and lower extremities, normal gait Skin: warm and dry Psych: normal mood and affect  Assessment  & Plan:   Hypertension Patient recently admitted to the hospital for hypertensive urgency on June 23, 2022.  She was discharged on amlodipine 10 mg, metoprolol 75 mg twice daily, and losartan 100 mg.  She does not have insurance and has not seen a primary care provider in years.  She does have a blood pressure cuff at home and when she checks her blood pressure is around 140/90.  Today in the clinic initial blood pressure was 157/85, but then elevated to 164/95.  She did not take her blood pressure medications today, however with her readings at home being around 140 I believe she will benefit from another antihypertensive agent.  Plan: - Will initiate hydrochlorothiazide 12.5 mg - Continue losartan 100 mg, metoprolol 75 mg twice daily, amlodipine 10 mg - Will follow-up in 1 month and will need BMP at that time - Can consider combination antihypertensive agents at next visit.  Controlled type 2 diabetes mellitus without complication, without long-term current use of insulin ([place]) Patient's last A1c done when she was in the hospital was 6.9%.  Her current regimen is metformin 1000 mg twice daily, as well as Actos 15 mg.  Of note, patient's BMI is also 51.35.  Do not believe Actos is the best medication for her, and she has a history of heart failure.  Will start her on an SGLT2 and stop the Actos.  Plan: - Start Jardiance 10 mg, can titrate up if necessary at next visit.  Morbid obesity with BMI of 50.0-59.9, adult [facility]) Patient with a BMI of 51.35.  Weight currently is 308 pounds (140 kg).  She has multiple comorbidities such as hypertension and type 2 diabetes.  I have informed patient about the negative outcomes associated with having a BMI at this high and she is agreeable to start exercising and controlling her diet better.  Her STOP-BANG score is currently 4 which puts her at intermediate risk for obstructive sleep apnea as she does endorse snoring at night, as well as daytime somnolence.   Given her lack of insurance, will be difficult to get her in for a sleep study.  She is currently working on [employer] and switching jobs.  Hyperlipidemia associated with type 2 diabetes mellitus ([place]) Patient's last LDL done in the hospital was 164.  She is currently on simvastatin 20 mg.  ASCVD risk is 23%.  Will continue simvastatin.  Can consider switching to atorvastatin 40 mg at next visit.  Depression Patient with history of depression, for which she takes Wellbutrin for.  She states this has been helping her significantly, will refill.  Patient discussed with Dr. Charissa Bash Lyda Colcord, M.D. [facility], PGY-1 Pager: [phone] Date 07/13/2022 Time 2:56 PM

## 2022-07-13 NOTE — Assessment & Plan Note (Signed)
Patient recently admitted to the hospital for hypertensive urgency on June 23, 2022.  She was discharged on amlodipine 10 mg, metoprolol 75 mg twice daily, and losartan 100 mg.  She does not have insurance and has not seen a primary care provider in years.  She does have a blood pressure cuff at home and when she checks her blood pressure is around 140/90.  Today in the clinic initial blood pressure was 157/85, but then elevated to 164/95.  She did not take her blood pressure medications today, however with her readings at home being around 140 I believe she will benefit from another antihypertensive agent.  Plan: - Will initiate hydrochlorothiazide 12.5 mg - Continue losartan 100 mg, metoprolol 75 mg twice daily, amlodipine 10 mg - Will follow-up in 1 month and will need BMP at that time - Can consider combination antihypertensive agents at next visit.

## 2022-07-15 NOTE — Progress Notes (Signed)
Internal Medicine Clinic Attending  Case discussed with Dr. Sanjuana Mae  At the time of the visit.  We reviewed the resident's history and exam and pertinent patient test results.  I agree with the assessment, diagnosis, and plan of care documented in the resident's note.

## 2022-07-26 ENCOUNTER — Other Ambulatory Visit (HOSPITAL_COMMUNITY): Payer: Self-pay

## 2022-08-09 ENCOUNTER — Ambulatory Visit: Payer: Self-pay | Admitting: Skilled Nursing Facility1

## 2022-08-31 ENCOUNTER — Other Ambulatory Visit: Payer: Self-pay

## 2022-08-31 ENCOUNTER — Other Ambulatory Visit (HOSPITAL_COMMUNITY): Payer: Self-pay

## 2022-08-31 ENCOUNTER — Other Ambulatory Visit: Payer: Self-pay | Admitting: Pharmacist
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
## 2022-09-16 ENCOUNTER — Other Ambulatory Visit (HOSPITAL_COMMUNITY): Payer: Self-pay

## 2022-09-17 ENCOUNTER — Other Ambulatory Visit (HOSPITAL_COMMUNITY): Payer: Self-pay

## 2022-11-03 ENCOUNTER — Other Ambulatory Visit (HOSPITAL_COMMUNITY): Payer: Self-pay

## 2022-11-05 ENCOUNTER — Other Ambulatory Visit (HOSPITAL_COMMUNITY): Payer: Self-pay

## 2022-12-05 ENCOUNTER — Encounter (HOSPITAL_COMMUNITY): Payer: Self-pay

## 2022-12-05 ENCOUNTER — Other Ambulatory Visit: Payer: Self-pay

## 2022-12-05 ENCOUNTER — Emergency Department (HOSPITAL_COMMUNITY)
Admission: EM | Admit: 2022-12-05 | Discharge: 2022-12-05 | Disposition: A | Discharge status: Home / Self Care | Payer: Self-pay | Attending: Emergency Medicine | Admitting: Emergency Medicine

## 2022-12-05 DIAGNOSIS — Z7984 Long term (current) use of oral hypoglycemic drugs: Secondary | ICD-10-CM | POA: Insufficient documentation

## 2022-12-05 DIAGNOSIS — E1165 Type 2 diabetes mellitus with hyperglycemia: Secondary | ICD-10-CM | POA: Insufficient documentation

## 2022-12-05 DIAGNOSIS — R519 Headache, unspecified: Secondary | ICD-10-CM | POA: Insufficient documentation

## 2022-12-05 DIAGNOSIS — I10 Essential (primary) hypertension: Secondary | ICD-10-CM | POA: Insufficient documentation

## 2022-12-05 DIAGNOSIS — Z79899 Other long term (current) drug therapy: Secondary | ICD-10-CM | POA: Insufficient documentation

## 2022-12-05 DIAGNOSIS — M25512 Pain in left shoulder: Secondary | ICD-10-CM | POA: Insufficient documentation

## 2022-12-05 LAB — BASIC METABOLIC PANEL
Anion gap: 12 (ref 5–15)
BUN: 11 mg/dL (ref 6–20)
CO2: 25 mmol/L (ref 22–32)
Calcium: 9.1 mg/dL (ref 8.9–10.3)
Chloride: 101 mmol/L (ref 98–111)
Creatinine, Ser: 1.18 mg/dL — ABNORMAL HIGH (ref 0.44–1.00)
GFR, Estimated: 56 mL/min — ABNORMAL LOW (ref >60)
Glucose, Bld: 116 mg/dL — ABNORMAL HIGH (ref 70–99)
Potassium: 3.8 mmol/L (ref 3.5–5.1)
Sodium: 138 mmol/L (ref 135–145)

## 2022-12-05 LAB — CBC WITH DIFFERENTIAL/PLATELET
Abs Immature Granulocytes: 0.06 10*3/uL (ref 0.00–0.07)
Basophils Absolute: 0 10*3/uL (ref 0.0–0.1)
Basophils Relative: 0 %
Eosinophils Absolute: 0.1 10*3/uL (ref 0.0–0.5)
Eosinophils Relative: 1 %
HCT: 42.7 % (ref 36.0–46.0)
Hemoglobin: 13.1 g/dL (ref 12.0–15.0)
Immature Granulocytes: 1 %
Lymphocytes Relative: 42 %
Lymphs Abs: 4.4 10*3/uL — ABNORMAL HIGH (ref 0.7–4.0)
MCH: 25.5 pg — ABNORMAL LOW (ref 26.0–34.0)
MCHC: 30.7 g/dL (ref 30.0–36.0)
MCV: 83.1 fL (ref 80.0–100.0)
Monocytes Absolute: 0.8 10*3/uL (ref 0.1–1.0)
Monocytes Relative: 8 %
Neutro Abs: 5.1 10*3/uL (ref 1.7–7.7)
Neutrophils Relative %: 48 %
Platelets: 309 10*3/uL (ref 150–400)
RBC: 5.14 MIL/uL — ABNORMAL HIGH (ref 3.87–5.11)
RDW: 15 % (ref 11.5–15.5)
WBC: 10.5 10*3/uL (ref 4.0–10.5)
nRBC: 0 % (ref 0.0–0.2)

## 2022-12-05 MED ORDER — DIPHENHYDRAMINE HCL 50 MG/ML IJ SOLN
25.0000 mg | Freq: Once | INTRAMUSCULAR | Status: AC
  Administered 2022-12-05: 25 mg via INTRAVENOUS
  Filled 2022-12-05: qty 1

## 2022-12-05 MED ORDER — ONDANSETRON 4 MG PO TBDP
4.0000 mg | ORAL_TABLET | Freq: Once | ORAL | Status: AC
  Administered 2022-12-05: 4 mg via ORAL
  Filled 2022-12-05: qty 1

## 2022-12-05 MED ORDER — KETOROLAC TROMETHAMINE 15 MG/ML IJ SOLN
15.0000 mg | Freq: Once | INTRAMUSCULAR | Status: AC
  Administered 2022-12-05: 15 mg via INTRAVENOUS
  Filled 2022-12-05: qty 1

## 2022-12-05 MED ORDER — SODIUM CHLORIDE 0.9 % IV BOLUS
1000.0000 mL | Freq: Once | INTRAVENOUS | Status: AC
  Administered 2022-12-05: 1000 mL via INTRAVENOUS

## 2022-12-05 MED ORDER — MAGNESIUM SULFATE 2 GM/50ML IV SOLN
2.0000 g | Freq: Once | INTRAVENOUS | Status: AC
  Administered 2022-12-05: 2 g via INTRAVENOUS
  Filled 2022-12-05: qty 50

## 2022-12-05 MED ORDER — PROCHLORPERAZINE EDISYLATE 10 MG/2ML IJ SOLN
10.0000 mg | Freq: Once | INTRAMUSCULAR | Status: AC
  Administered 2022-12-05: 10 mg via INTRAVENOUS
  Filled 2022-12-05: qty 2

## 2022-12-05 MED ORDER — ACETAMINOPHEN 325 MG PO TABS
650.0000 mg | ORAL_TABLET | Freq: Once | ORAL | Status: AC
  Administered 2022-12-05: 650 mg via ORAL
  Filled 2022-12-05: qty 2

## 2022-12-05 MED ORDER — METOCLOPRAMIDE HCL 10 MG PO TABS
10.0000 mg | ORAL_TABLET | Freq: Once | ORAL | Status: AC
  Administered 2022-12-05: 10 mg via ORAL
  Filled 2022-12-05: qty 1

## 2022-12-05 NOTE — ED Triage Notes (Signed)
Pt reports constant headache x 3 days, hx of migraines but has not been able to afford migraine medication (Sumatriptan) and this HA not relieved with Tylenol. She reports neck tightness. She reports this HA feels the same as her previous headaches.

## 2022-12-05 NOTE — ED Provider Triage Note (Signed)
Emergency Medicine Provider Triage Evaluation Note  Courtney Clayton , a 52 y.o. female  was evaluated in triage.  Pt complains of bad headache.  She has a history of migraine headaches.  She currently does not have insurance states that when she does have a bad headaches usually comes here.  She is not on any kind of preventatives.  She has taken triptans in the past with some relief..  Complains of light sensitivity  Review of Systems  Positive: Bad headache Negative: Fever  Physical Exam  BP (!) 167/94 (BP Location: Right Arm)   Pulse 69   Temp 98.7 F (37.1 C) (Oral)   Resp 16   Ht 5\' 5"  (1.651 m)   Wt (!) 139.7 kg   SpO2 96%   BMI 51.25 kg/m  Gen:   Awake, no distress   Resp:  Normal effort  MSK:   Moves extremities without difficulty  Other:   Medical Decision Making  Medically screening exam initiated at 3:21 PM.  Appropriate orders placed.  Courtney Clayton was informed that the remainder of the evaluation will be completed by another provider, this initial triage assessment does not replace that evaluation, and the importance of remaining in the ED until their evaluation is complete.     Arthor Captain, PA-C 12/05/22 1525

## 2022-12-05 NOTE — ED Provider Notes (Signed)
[place] EMERGENCY DEPARTMENT AT [facility] Provider Note   CSN: [id number] Arrival date & time: 12/05/22  1438     History  Chief Complaint  Patient presents with   Headache    Courtney Clayton is a 52 y.o. female.  HPI 52 year old female with a history of recurrent headaches, hypertension, type 2 diabetes, presents with occipital headache and left shoulder pain.  Symptoms start about 3 days ago.  They have gradually worsened since onset.  She states that she does not typically get pain and tightness in her left shoulder.  She thinks there is some spasm.  No neck stiffness, fever, vision changes.  She does have photophobia and nausea.  No focal weakness or numbness.  She will get headaches very often but typically gets a headache this bad about once or twice a month.   Home Medications Prior to Admission medications   Medication Sig Start Date End Date Taking? Authorizing Provider  albuterol (PROVENTIL) (2.5 MG/3ML) 0.083% nebulizer solution Take 3 mLs (2.5 mg total) by nebulization every 2 (two) hours as needed for wheezing. 10/04/21   Rhetta Mura, MD  allopurinol (ZYLOPRIM) 100 MG tablet Take 1 tablet (100 mg total) by mouth daily. 06/24/22 12/03/22  Calvert Cantor, MD  amLODipine (NORVASC) 10 MG tablet Take 1 tablet (10 mg total) by mouth daily. 07/13/22   Nooruddin, Jason Fila, MD  Blood Glucose Monitoring Suppl (BLOOD GLUCOSE MONITOR SYSTEM) w/Device KIT Use to test blood sugar in the morning, at noon, and at bedtime. 06/24/22   Calvert Cantor, MD  buPROPion (WELLBUTRIN XL) 150 MG 24 hr tablet Take 3 tablets (450 mg total) by mouth every morning. 07/13/22   Nooruddin, Jason Fila, MD  calcium carbonate (TUMS - DOSED IN MG ELEMENTAL CALCIUM) 500 MG chewable tablet Chew 2 tablets by mouth daily as needed for indigestion or heartburn.    [provider]  cetirizine (ZYRTEC ALLERGY) 10 MG tablet Take 1 tablet (10 mg total) by mouth at bedtime as needed  for allergies or  rhinitis. 06/24/22   Calvert Cantor, MD  empagliflozin (JARDIANCE) 10 MG TABS tablet Take 1 tablet (10 mg total) by mouth daily before breakfast. 07/13/22   Nooruddin, Jason Fila, MD  hydrochlorothiazide (HYDRODIURIL) 12.5 MG tablet Take 1 tablet (12.5 mg total) by mouth daily. 07/13/22   Nooruddin, Jason Fila, MD  ipratropium-albuterol (DUONEB) 0.5-2.5 (3) MG/3ML SOLN Take 3 mLs by nebulization every 4 (four) hours as needed. Patient taking differently: Take 3 mLs by nebulization every 4 (four) hours as needed (sob/wheezing). 04/29/22   Dartha Lodge, PA-C  losartan (COZAAR) 100 MG tablet Take 1 tablet (100 mg total) by mouth daily. 07/13/22   Nooruddin, Jason Fila, MD  metFORMIN (GLUCOPHAGE) 500 MG tablet Take 2 tablets by mouth 2 times daily with a meal. 07/13/22   Nooruddin, Jason Fila, MD  Metoprolol Tartrate 75 MG TABS Take 1 tablet (75 mg total) by mouth 2 (two) times daily. 07/13/22   Nooruddin, Jason Fila, MD  Peak Flow Meter DEVI 1 Device by Does not apply route as needed. 10/04/21   Rhetta Mura, MD  simvastatin (ZOCOR) 20 MG tablet Take 1 tablet (20 mg total) by mouth at bedtime. 07/13/22   Nooruddin, Jason Fila, MD  zolpidem (AMBIEN CR) 12.5 MG CR tablet Take 1 tablet by mouth at bedtime as needed for sleep. 06/24/22   Calvert Cantor, MD      Allergies    Sulfa antibiotics    Review of Systems   Review of Systems  Constitutional:  Negative for fever.  Eyes:  Positive for photophobia. Negative for visual disturbance.  Gastrointestinal:  Positive for nausea.  Musculoskeletal:  Positive for neck pain.  Neurological:  Positive for headaches. Negative for weakness and numbness.    Physical Exam Updated Vital Signs BP (!) 165/92 (BP Location: Left Arm)   Pulse 61   Temp 98 F (36.7 C) (Oral)   Resp 17   Ht 5\' 5"  (1.651 m)   Wt (!) 139.7 kg   SpO2 97%   BMI 51.25 kg/m  Physical Exam Vitals and nursing note reviewed.  Constitutional:      General: She is not in acute distress.    Appearance: She is well-developed.  She is obese. She is not ill-appearing or diaphoretic.  HENT:     Head: Normocephalic and atraumatic.  Eyes:     Pupils: Pupils are equal, round, and reactive to light.  Neck:     Comments: Diffuse tenderness along left trapezius Cardiovascular:     Rate and Rhythm: Normal rate and regular rhythm.     Pulses:          Radial pulses are 2+ on the left side.     Heart sounds: Normal heart sounds.  Pulmonary:     Effort: Pulmonary effort is normal.     Breath sounds: Normal breath sounds.  Abdominal:     Palpations: Abdomen is soft.     Tenderness: There is no abdominal tenderness.  Musculoskeletal:     Cervical back: No rigidity.  Skin:    General: Skin is warm and dry.  Neurological:     Mental Status: She is alert.     Comments: CN 3-12 grossly intact. 5/5 strength in all 4 extremities. Grossly normal sensation. Normal finger to nose.      ED Results / Procedures / Treatments   Labs (all labs ordered are listed, but only abnormal results are displayed) Labs Reviewed  CBC WITH DIFFERENTIAL/PLATELET - Abnormal; Notable for the following components:      Result Value   RBC 5.14 (*)    MCH 25.5 (*)    Lymphs Abs 4.4 (*)    All other components within normal limits  BASIC METABOLIC PANEL - Abnormal; Notable for the following components:   Glucose, Bld 116 (*)    Creatinine, Ser 1.18 (*)    GFR, Estimated 56 (*)    All other components within normal limits    EKG None  Radiology No results found.  Procedures Procedures    Medications Ordered in ED Medications  ondansetron (ZOFRAN-ODT) disintegrating tablet 4 mg (4 mg Oral Given 12/05/22 1525)  acetaminophen (TYLENOL) tablet 650 mg (650 mg Oral Given 12/05/22 1524)  metoCLOPramide (REGLAN) tablet 10 mg (10 mg Oral Given 12/05/22 1524)  sodium chloride 0.9 % bolus 1,000 mL (0 mLs Intravenous Stopped 12/05/22 2217)  ketorolac (TORADOL) 15 MG/ML injection 15 mg (15 mg Intravenous Given 12/05/22 1942)  prochlorperazine  (COMPAZINE) injection 10 mg (10 mg Intravenous Given 12/05/22 1940)  diphenhydrAMINE (BENADRYL) injection 25 mg (25 mg Intravenous Given 12/05/22 1939)  magnesium sulfate IVPB 2 g 50 mL (2 g Intravenous New Bag/Given 12/05/22 2229)    ED Course/ Medical Decision Making/ A&P                             Medical Decision Making Amount and/or Complexity of Data Reviewed Labs: ordered.    Details: Normal WBC.  Mild bump in  her creatinine.  Mild hyperglycemia.  Risk Prescription drug management.   Patient's headache has improved.  Still has a headache though is feeling better and she wants to go home.  Discussed with this multiple times and for now she wants to hold off on a head CT.  She states this headache is similar to many prior headaches.  No meningismus.  I think the trapezius pain is muscular.  She was given headache medicine and feels better.  Highly doubt meningitis, stroke, CNS mass, venous thrombosis, etc.  Will discharge home with return precautions.  Recommend NSAIDs and Tylenol.        Final Clinical Impression(s) / ED Diagnoses Final diagnoses:  Bad headache    Rx / DC Orders ED Discharge Orders     None         Pricilla Loveless, MD 12/05/22 2303

## 2022-12-05 NOTE — Discharge Instructions (Addendum)
Take Ibuprofen and Tylenol for pain. Drink plenty of fluids.   If your headache does not improve, worsens, or if you develop fever, vomiting, vision changes, speech changes, weakness or numbness in your arms/legs, or any other new/concerning symptoms then return to the ER or call 911.

## 2022-12-21 ENCOUNTER — Other Ambulatory Visit: Payer: Self-pay

## 2022-12-21 ENCOUNTER — Other Ambulatory Visit (HOSPITAL_COMMUNITY)
Admission: RE | Admit: 2022-12-21 | Discharge: 2022-12-21 | Disposition: A | Discharge status: Home / Self Care | Payer: Self-pay | Source: Ambulatory Visit | Attending: Internal Medicine | Admitting: Internal Medicine

## 2022-12-21 ENCOUNTER — Ambulatory Visit (INDEPENDENT_AMBULATORY_CARE_PROVIDER_SITE_OTHER): Payer: Self-pay | Admitting: Internal Medicine

## 2022-12-21 ENCOUNTER — Encounter: Payer: Self-pay | Admitting: Internal Medicine

## 2022-12-21 ENCOUNTER — Other Ambulatory Visit (HOSPITAL_COMMUNITY): Payer: Self-pay

## 2022-12-21 VITALS — BP 128/74 | HR 65 | Temp 97.9°F | Ht 65.0 in | Wt 298.4 lb

## 2022-12-21 DIAGNOSIS — F339 Major depressive disorder, recurrent, unspecified: Secondary | ICD-10-CM

## 2022-12-21 DIAGNOSIS — E1169 Type 2 diabetes mellitus with other specified complication: Secondary | ICD-10-CM

## 2022-12-21 DIAGNOSIS — N898 Other specified noninflammatory disorders of vagina: Secondary | ICD-10-CM | POA: Insufficient documentation

## 2022-12-21 DIAGNOSIS — Z124 Encounter for screening for malignant neoplasm of cervix: Secondary | ICD-10-CM | POA: Insufficient documentation

## 2022-12-21 DIAGNOSIS — I10 Essential (primary) hypertension: Secondary | ICD-10-CM

## 2022-12-21 DIAGNOSIS — E785 Hyperlipidemia, unspecified: Secondary | ICD-10-CM

## 2022-12-21 DIAGNOSIS — Z7984 Long term (current) use of oral hypoglycemic drugs: Secondary | ICD-10-CM

## 2022-12-21 DIAGNOSIS — Z1231 Encounter for screening mammogram for malignant neoplasm of breast: Secondary | ICD-10-CM | POA: Insufficient documentation

## 2022-12-21 DIAGNOSIS — E119 Type 2 diabetes mellitus without complications: Secondary | ICD-10-CM

## 2022-12-21 HISTORY — DX: Other specified noninflammatory disorders of vagina: N89.8

## 2022-12-21 LAB — POCT GLYCOSYLATED HEMOGLOBIN (HGB A1C): Hemoglobin A1C: 6.8 % — AB (ref 4.0–5.6)

## 2022-12-21 LAB — GLUCOSE, CAPILLARY: Glucose-Capillary: 143 mg/dL — ABNORMAL HIGH (ref 70–99)

## 2022-12-21 MED ORDER — LOSARTAN POTASSIUM 100 MG PO TABS
100.0000 mg | ORAL_TABLET | Freq: Every day | ORAL | 3 refills | Status: DC
  Filled 2022-12-21: qty 30, 30d supply, fill #0
  Filled 2023-03-04 (×2): qty 30, 30d supply, fill #1
  Filled 2023-04-16: qty 30, 30d supply, fill #2
  Filled 2023-06-13: qty 30, 30d supply, fill #3

## 2022-12-21 MED ORDER — METOPROLOL TARTRATE 75 MG PO TABS
75.0000 mg | ORAL_TABLET | Freq: Two times a day (BID) | ORAL | 3 refills | Status: DC
Start: 2022-12-21 — End: 2023-04-18
  Filled 2022-12-21: qty 60, 30d supply, fill #0
  Filled 2023-03-04: qty 60, 30d supply, fill #1
  Filled 2023-04-16: qty 60, 30d supply, fill #2

## 2022-12-21 MED ORDER — EMPAGLIFLOZIN 10 MG PO TABS
10.0000 mg | ORAL_TABLET | Freq: Every day | ORAL | 3 refills | Status: DC
Start: 2022-12-21 — End: 2023-04-29
  Filled 2022-12-21: qty 30, 30d supply, fill #0
  Filled 2023-04-16: qty 30, 30d supply, fill #1

## 2022-12-21 MED ORDER — AMLODIPINE BESYLATE 10 MG PO TABS
10.0000 mg | ORAL_TABLET | Freq: Every day | ORAL | 3 refills | Status: DC
Start: 2022-12-21 — End: 2023-06-21
  Filled 2022-12-21: qty 30, 30d supply, fill #0
  Filled 2023-03-04: qty 30, 30d supply, fill #1
  Filled 2023-04-16: qty 30, 30d supply, fill #2
  Filled 2023-06-13: qty 30, 30d supply, fill #3

## 2022-12-21 MED ORDER — SIMVASTATIN 20 MG PO TABS
20.0000 mg | ORAL_TABLET | Freq: Every day | ORAL | 0 refills | Status: DC
  Filled 2022-12-21: qty 30, 30d supply, fill #0
  Filled 2023-03-04: qty 30, 30d supply, fill #1
  Filled 2023-04-16: qty 30, 30d supply, fill #2

## 2022-12-21 MED ORDER — HYDROCHLOROTHIAZIDE 12.5 MG PO TABS
12.5000 mg | ORAL_TABLET | Freq: Every day | ORAL | 3 refills | Status: DC
Start: 2022-12-21 — End: 2023-06-21
  Filled 2022-12-21: qty 30, 30d supply, fill #0
  Filled 2023-03-04: qty 30, 30d supply, fill #1
  Filled 2023-04-16: qty 30, 30d supply, fill #2
  Filled 2023-06-13: qty 30, 30d supply, fill #3

## 2022-12-21 MED ORDER — METFORMIN HCL 500 MG PO TABS
1000.0000 mg | ORAL_TABLET | Freq: Two times a day (BID) | ORAL | 3 refills | Status: DC
Start: 2022-12-21 — End: 2023-04-29
  Filled 2022-12-21: qty 120, 30d supply, fill #0

## 2022-12-21 MED ORDER — CITALOPRAM HYDROBROMIDE 20 MG PO TABS
20.0000 mg | ORAL_TABLET | Freq: Every day | ORAL | 1 refills | Status: DC
Start: 2022-12-21 — End: 2023-04-16
  Filled 2022-12-21: qty 30, 30d supply, fill #0
  Filled 2023-03-04: qty 30, 30d supply, fill #1

## 2022-12-21 MED ORDER — CITALOPRAM HYDROBROMIDE 20 MG PO TABS: 20.0000 mg | ORAL_TABLET | Freq: Every day | ORAL | 1 refills | Status: DC

## 2022-12-21 NOTE — Assessment & Plan Note (Signed)
PHQ-9 18, increased from 14 07/2022. On wellbutrin 450 mg daily which is not helping with her symptoms. She is interested in trying a new medication. She has called the mental health hotline multiple times due to her depression symptoms. No SI or HI. Plan:Stop wellbutrin. Start citalopram 20 mg daily. Follow up in about 4 weeks and increase dose if appropriate or try different agent if indicated.

## 2022-12-21 NOTE — Assessment & Plan Note (Signed)
OP regimen is simvastatin 20 mg daily. ASCVD last calculated at 23% in 07/2022 based on last lipid panel. Plan: Lipid panel once patient is insured. Continue simvastatin 20 mg daily.

## 2022-12-21 NOTE — Patient Instructions (Signed)
Courtney Clayton,  It was nice seeing you today! Thank you for choosing [facility] for your Primary Care.    I have sent a prescription for celexa for your depression. You will take this once daily. I would like you to follow up in about 4 weeks to see how the medication is helping your symptoms.  I will let you know what the results of your labs are and if we need to add or change any of your medications based on those results.  I have placed referrals that will allow you to get your mammogram and pap smears done. You will receive a phone call to arrange these.  My best, Dr. August Saucer

## 2022-12-21 NOTE — Assessment & Plan Note (Signed)
Patient complains of yeast infection today. She has been having symptoms for about 2 weeks of itching, discharge that was initially clear > greenish tint. She did use a new soap from [employer] before her symptoms started. No new sexual partners and is not concerned about having contracted STI from her current partner who she is monogamous with. She last had a yeast infection when she was pregnant and this feels similar to that. No burning or pain with urination, no fever or chills. No new antibiotics. She has tried monistat without relief. Plan:Self swab collected today for yeast and bacterial vaginosis.

## 2022-12-21 NOTE — Assessment & Plan Note (Signed)
Screening mammogram ordered under [employer] program given uninsured status.

## 2022-12-21 NOTE — Progress Notes (Signed)
CC: c/f yeast infection, regular check up  HPI:  [name] is a 52 y.o. female with past medical history as detailed below who presents today for regular check up and with concern for yeast infection. Please see problem based charting for detailed assessment and plan.  Past Medical History:  Diagnosis Date   Asthma    CHF (congestive heart failure) (HCC)    Depression    Diabetes mellitus without complication (HCC)    Hypertension    Hypertensive urgency 10/03/2021   Hypertensive urgency 10/03/2021   MI, old    Review of Systems:  Negative unless otherwise stated.  Physical Exam:  Vitals:   12/21/22 0846  BP: 128/74  Pulse: 65  Temp: 97.9 F (36.6 C)  TempSrc: Oral  SpO2: 100%  Weight: 298 lb 6.4 oz (135.4 kg)  Height: 5\' 5"  (1.651 m)   Constitutional:Obese female, well appearing, in no acute distress.: Cardio:Regular rate and rhythm. No murmurs, rubs, or gallops. Pulm:Clear to auscultation bilaterally. Normal work of breathing on room air. ZOX:WRUEAVWU for extremity edema. Skin:Warm and dry. Neuro:Alert and oriented x3. No focal deficit noted. Psych:Pleasant mood and affect.  Assessment & Plan:   See Encounters Tab for problem based charting.  Hypertension BP 128/74. OP regimen is losartan 100 mg daily, metoprolol 75 mg BID, amlodipine 10 mg daily, hydrochlorothiazide 12.5 mg daily which she has been compliant with. She is overdue for a BMP. Plan:BMP today. Continue losartan 100 mg daily, metoprolol 75 mg BID, amlodipine 10 mg daily, hydrochlorothiazide 12.5 mg daily.  Controlled type 2 diabetes mellitus without complication, without long-term current use of insulin (HCC) HbA1c today 6.8%, last checked 06/2022. OP regimen is metformin 1000 mg BID, jardiance 10 mg daily which she is compliant with. She does not consistently check her blood sugar at home. She is not currently scheduled for annual diabetic eye exam. Plan:Discussed need for annual eye  exam. Continue metformin 1000 mg BID, jardiance 10 mg daily. Consider starting SGLT2 such as victoza (if she remains uninsured) in the future.  Hyperlipidemia associated with type 2 diabetes mellitus (HCC) OP regimen is simvastatin 20 mg daily. ASCVD last calculated at 23% in 07/2022 based on last lipid panel. Plan: Lipid panel once patient is insured. Continue simvastatin 20 mg daily.  Depression PHQ-9 18, increased from 14 07/2022. On wellbutrin 450 mg daily which is not helping with her symptoms. She is interested in trying a new medication. She has called the mental health hotline multiple times due to her depression symptoms. No SI or HI. Plan:Stop wellbutrin. Start citalopram 20 mg daily. Follow up in about 4 weeks and increase dose if appropriate or try different agent if indicated.  Vaginal itching Patient complains of yeast infection today. She has been having symptoms for about 2 weeks of itching, discharge that was initially clear > greenish tint. She did use a new soap from [employer] before her symptoms started. No new sexual partners and is not concerned about having contracted STI from her current partner who she is monogamous with. She last had a yeast infection when she was pregnant and this feels similar to that. No burning or pain with urination, no fever or chills. No new antibiotics. She has tried monistat without relief. Plan:Self swab collected today for yeast and bacterial vaginosis.  Cervical cancer screening Referral to OB/Gyn under [facility] program placed for pap smear.  Encounter for screening mammogram for breast cancer Screening mammogram ordered under [employer] program given uninsured  status.  Patient discussed with Dr.  Lafonda Mosses

## 2022-12-21 NOTE — Assessment & Plan Note (Signed)
BP 128/74. OP regimen is losartan 100 mg daily, metoprolol 75 mg BID, amlodipine 10 mg daily, hydrochlorothiazide 12.5 mg daily which she has been compliant with. She is overdue for a BMP. Plan:BMP today. Continue losartan 100 mg daily, metoprolol 75 mg BID, amlodipine 10 mg daily, hydrochlorothiazide 12.5 mg daily.

## 2022-12-21 NOTE — Assessment & Plan Note (Signed)
Referral to OB/Gyn under [employer] program placed for pap smear.

## 2022-12-21 NOTE — Assessment & Plan Note (Signed)
HbA1c today 6.8%, last checked 06/2022. OP regimen is metformin 1000 mg BID, jardiance 10 mg daily which she is compliant with. She does not consistently check her blood sugar at home. She is not currently scheduled for annual diabetic eye exam. Plan:Discussed need for annual eye exam. Continue metformin 1000 mg BID, jardiance 10 mg daily. Consider starting SGLT2 such as victoza (if she remains uninsured) in the future.

## 2022-12-22 ENCOUNTER — Telehealth: Payer: Self-pay

## 2022-12-22 ENCOUNTER — Telehealth: Payer: Self-pay | Admitting: Internal Medicine

## 2022-12-22 ENCOUNTER — Other Ambulatory Visit (HOSPITAL_COMMUNITY): Payer: Self-pay

## 2022-12-22 MED ORDER — FLUCONAZOLE 150 MG PO TABS
150.0000 mg | ORAL_TABLET | Freq: Every day | ORAL | 0 refills | Status: DC
Start: 2022-12-22 — End: 2023-04-29
  Filled 2022-12-22: qty 2, 3d supply, fill #0

## 2022-12-22 MED ORDER — METRONIDAZOLE 500 MG PO TABS
500.0000 mg | ORAL_TABLET | Freq: Two times a day (BID) | ORAL | 0 refills | Status: AC
Start: 2022-12-22 — End: 2022-12-30
  Filled 2022-12-22: qty 14, 7d supply, fill #0

## 2022-12-22 NOTE — Telephone Encounter (Signed)
Contacted patient to discuss results of self swab which was positive for candida and bacterial vaginosis. I have sent in diflucan and metronidazole, and explained how she should take these medications. Advised patient to contact clinic if her symptoms do not improve with treatment. She had no further questions.  Champ Mungo, DO

## 2022-12-22 NOTE — Telephone Encounter (Signed)
Telephoned patient at mobile number. Patient completed [employer] screening process and ineligible for program. [facility] (referral)

## 2022-12-23 ENCOUNTER — Other Ambulatory Visit (HOSPITAL_COMMUNITY): Payer: Self-pay

## 2022-12-23 NOTE — Progress Notes (Signed)
Internal Medicine Clinic Attending  Case discussed with the resident at the time of the visit.  We reviewed the resident's history and exam and pertinent patient test results.  I agree with the assessment, diagnosis, and plan of care documented in the resident's note.  

## 2023-01-05 ENCOUNTER — Ambulatory Visit: Payer: Self-pay

## 2023-01-18 ENCOUNTER — Encounter: Payer: Self-pay | Admitting: Student

## 2023-03-04 ENCOUNTER — Other Ambulatory Visit (HOSPITAL_COMMUNITY): Payer: Self-pay

## 2023-03-04 ENCOUNTER — Other Ambulatory Visit: Payer: Self-pay

## 2023-03-09 ENCOUNTER — Other Ambulatory Visit (HOSPITAL_COMMUNITY): Payer: Self-pay

## 2023-03-16 ENCOUNTER — Other Ambulatory Visit (HOSPITAL_COMMUNITY): Payer: Self-pay

## 2023-04-16 ENCOUNTER — Other Ambulatory Visit: Payer: Self-pay

## 2023-04-16 ENCOUNTER — Other Ambulatory Visit: Payer: Self-pay | Admitting: Student

## 2023-04-16 ENCOUNTER — Other Ambulatory Visit (HOSPITAL_COMMUNITY): Payer: Self-pay

## 2023-04-16 ENCOUNTER — Other Ambulatory Visit: Payer: Self-pay | Admitting: Internal Medicine

## 2023-04-16 DIAGNOSIS — F339 Major depressive disorder, recurrent, unspecified: Secondary | ICD-10-CM

## 2023-04-18 ENCOUNTER — Other Ambulatory Visit: Payer: Self-pay | Admitting: Internal Medicine

## 2023-04-18 ENCOUNTER — Other Ambulatory Visit (HOSPITAL_COMMUNITY): Payer: Self-pay

## 2023-04-18 ENCOUNTER — Other Ambulatory Visit: Payer: Self-pay

## 2023-04-18 DIAGNOSIS — I10 Essential (primary) hypertension: Secondary | ICD-10-CM

## 2023-04-18 MED ORDER — METOPROLOL TARTRATE 75 MG PO TABS
75.0000 mg | ORAL_TABLET | Freq: Two times a day (BID) | ORAL | 3 refills | Status: DC
Start: 2023-04-18 — End: 2023-06-21
  Filled 2023-04-18 (×2): qty 60, 30d supply, fill #0
  Filled 2023-06-13 (×2): qty 60, 30d supply, fill #1

## 2023-04-18 MED ORDER — CITALOPRAM HYDROBROMIDE 20 MG PO TABS
20.0000 mg | ORAL_TABLET | Freq: Every day | ORAL | 1 refills | Status: DC
Start: 2023-04-18 — End: 2023-06-21
  Filled 2023-04-18: qty 30, 30d supply, fill #0
  Filled 2023-06-13: qty 30, 30d supply, fill #1

## 2023-04-18 MED ORDER — CALCIUM CARBONATE ANTACID 500 MG PO CHEW
2.0000 | CHEWABLE_TABLET | Freq: Every day | ORAL | 3 refills | Status: DC | PRN
  Filled 2023-04-18: qty 30, 15d supply, fill #0

## 2023-04-19 ENCOUNTER — Other Ambulatory Visit: Payer: Self-pay

## 2023-04-19 ENCOUNTER — Other Ambulatory Visit (HOSPITAL_COMMUNITY): Payer: Self-pay

## 2023-04-22 ENCOUNTER — Other Ambulatory Visit (HOSPITAL_COMMUNITY): Payer: Self-pay

## 2023-04-28 ENCOUNTER — Other Ambulatory Visit (HOSPITAL_COMMUNITY): Payer: Self-pay

## 2023-04-28 ENCOUNTER — Ambulatory Visit: Payer: Self-pay | Admitting: Certified Nurse Midwife

## 2023-04-28 DIAGNOSIS — Z91199 Patient's noncompliance with other medical treatment and regimen due to unspecified reason: Secondary | ICD-10-CM

## 2023-04-28 NOTE — Progress Notes (Signed)
Patient did not come to appointment. May reschedule.  Lamont Snowball, MSN, CNM, RNC-OB Certified Nurse Midwife, [facility] 04/28/2023 10:06 AM

## 2023-04-29 ENCOUNTER — Other Ambulatory Visit (HOSPITAL_COMMUNITY): Payer: Self-pay

## 2023-04-29 ENCOUNTER — Ambulatory Visit: Payer: Self-pay | Admitting: Internal Medicine

## 2023-04-29 ENCOUNTER — Other Ambulatory Visit: Payer: Self-pay | Admitting: Internal Medicine

## 2023-04-29 VITALS — BP 149/84 | HR 61 | Temp 98.8°F | Ht 65.0 in | Wt 316.8 lb

## 2023-04-29 DIAGNOSIS — E1169 Type 2 diabetes mellitus with other specified complication: Secondary | ICD-10-CM

## 2023-04-29 DIAGNOSIS — I10 Essential (primary) hypertension: Secondary | ICD-10-CM

## 2023-04-29 DIAGNOSIS — Z23 Encounter for immunization: Secondary | ICD-10-CM | POA: Insufficient documentation

## 2023-04-29 DIAGNOSIS — E119 Type 2 diabetes mellitus without complications: Secondary | ICD-10-CM

## 2023-04-29 DIAGNOSIS — Z7984 Long term (current) use of oral hypoglycemic drugs: Secondary | ICD-10-CM

## 2023-04-29 DIAGNOSIS — E785 Hyperlipidemia, unspecified: Secondary | ICD-10-CM

## 2023-04-29 LAB — POCT GLYCOSYLATED HEMOGLOBIN (HGB A1C): Hemoglobin A1C: 7.3 % — AB (ref 4.0–5.6)

## 2023-04-29 LAB — GLUCOSE, CAPILLARY: Glucose-Capillary: 166 mg/dL — ABNORMAL HIGH (ref 70–99)

## 2023-04-29 MED ORDER — CETIRIZINE HCL 10 MG PO TABS
10.0000 mg | ORAL_TABLET | Freq: Every evening | ORAL | 0 refills | Status: DC | PRN
  Filled 2023-04-29: qty 100, 100d supply, fill #0

## 2023-04-29 MED ORDER — LIRAGLUTIDE 18 MG/3ML ~~LOC~~ SOPN: 0.6000 mg | PEN_INJECTOR | Freq: Every day | SUBCUTANEOUS | 1 refills | Status: DC

## 2023-04-29 MED ORDER — EMPAGLIFLOZIN 10 MG PO TABS
10.0000 mg | ORAL_TABLET | Freq: Every day | ORAL | 3 refills | Status: DC
Start: 2023-04-29 — End: 2023-06-21

## 2023-04-29 MED ORDER — ALLOPURINOL 100 MG PO TABS
100.0000 mg | ORAL_TABLET | Freq: Every day | ORAL | 0 refills | Status: DC
Start: 2023-04-29 — End: 2023-07-28
  Filled 2023-04-29: qty 90, 90d supply, fill #0

## 2023-04-29 NOTE — Progress Notes (Signed)
CC: routine f/u  HPI:  Courtney Clayton is a 52 y.o. female with past medical history as detailed below who presents for routine f/u. Please see problem based charting for detailed assessment and plan.  Past Medical History:  Diagnosis Date   Asthma    CHF (congestive heart failure) (HCC)    Depression    Diabetes mellitus without complication (HCC)    Hypertension    Hypertensive urgency 10/03/2021   Hypertensive urgency 10/03/2021   MI, old    Review of Systems:  Negative unless otherwise stated.  Physical Exam:  Vitals:   04/29/23 0900 04/29/23 0940  BP: (!) 150/86 (!) 149/84  Pulse: 66 61  Temp: 98.8 F (37.1 C)   TempSrc: Oral   SpO2: 98%   Weight: (!) 316 lb 12.8 oz (143.7 kg)   Height: 5\' 5"  (1.651 m)    Physical Exam Constitutional:      General: She is not in acute distress.    Appearance: Normal appearance. She is not ill-appearing or toxic-appearing.  Cardiovascular:     Rate and Rhythm: Normal rate and regular rhythm.  Pulmonary:     Effort: Pulmonary effort is normal.     Breath sounds: Normal breath sounds.  Abdominal:     General: There is no distension.     Palpations: Abdomen is soft.     Tenderness: There is no abdominal tenderness.  Musculoskeletal:     Right lower leg: No edema.     Left lower leg: No edema.  Skin:    General: Skin is warm and dry.  Neurological:     General: No focal deficit present.     Mental Status: She is alert and oriented to person, place, and time.  Psychiatric:        Mood and Affect: Mood normal.        Behavior: Behavior normal.    Assessment & Plan:   See Encounters Tab for problem based charting.  Hypertension BP 150/86, on recheck 149/84. OP regimen is amlodipine 10 mg daily, hydrochlorothiazide 12.5 mg daily, losartan 100 mg daily, metoprolol 75 mg daily which she is compliant with but has not taken today. She does check it at home and says it runs SBPs 120s and sometimes into 190s. She thinks it  has been higher normal lately because she was out of one of her BP medications. BMP last checked 02/2023 with mild elevation in creatinine, GFT consistent with CKD stage 2.  Plan:Continue current regimen. Will have her bring her BP cuff to next visit and recheck BMP at that time.  Controlled type 2 diabetes mellitus without complication, without long-term current use of insulin (HCC) HbA1c last checked 12/2022 6.8%, today 7.3%. OP regimen is jardiance 10 mg daily, metformin 1000 mg BID however she stopped taking metformin as it leaves a chalky taste in her mouth and made her feel unwell. She stopped taking it about 1.5-2 months ago. She does check her blood sugar at home but did not bring her meter. Her blood sugar runs in the low 200s normally, sometimes this high fasting in the morning. She did previously on an GLP1 RA but it has been several years since she used a medication like this. I do not see any recent testing of urine microalbumin/creatinine. Plan: Emphasized importance of annual diabetic eye exam. Urine microalbumin/creatinine ratio collected today. Discussed patient assistance program for Victoza which she is interested in; I have sent in a prescription for this and placed a  referral to [facility].  Hyperlipidemia associated with type 2 diabetes mellitus (HCC) OP regimen is simvastatin 20 mg daily which she is compliant with. Lipid panel last checked 06/2022 with LDL above goal at 164.  Plan:Lipid panel today. Continue current regimen.  Encounter for immunization Tetanus booster and flu shots given today.  Patient discussed with Dr. Mikey Bussing

## 2023-04-29 NOTE — Assessment & Plan Note (Signed)
BP 150/86, on recheck 149/84. OP regimen is amlodipine 10 mg daily, hydrochlorothiazide 12.5 mg daily, losartan 100 mg daily, metoprolol 75 mg daily which she is compliant with but has not taken today. She does check it at home and says it runs SBPs 120s and sometimes into 190s. She thinks it has been higher normal lately because she was out of one of her BP medications. BMP last checked 02/2023 with mild elevation in creatinine, GFT consistent with CKD stage 2.  Plan:Continue current regimen. Will have her bring her BP cuff to next visit and recheck BMP at that time.

## 2023-04-29 NOTE — Assessment & Plan Note (Signed)
HbA1c last checked 12/2022 6.8%, today 7.3%. OP regimen is jardiance 10 mg daily, metformin 1000 mg BID however she stopped taking metformin as it leaves a chalky taste in her mouth and made her feel unwell. She stopped taking it about 1.5-2 months ago. She does check her blood sugar at home but did not bring her meter. Her blood sugar runs in the low 200s normally, sometimes this high fasting in the morning. She did previously on an GLP1 RA but it has been several years since she used a medication like this. I do not see any recent testing of urine microalbumin/creatinine. Plan: Emphasized importance of annual diabetic eye exam. Urine microalbumin/creatinine ratio collected today. Discussed patient assistance program for Victoza which she is interested in; I have sent in a prescription for this and placed a referral to [facility].

## 2023-04-29 NOTE — Patient Instructions (Signed)
It was a pleasure to care for you today!  I have sent in victoza for your diabetes. I would like you to investigate the patient assistance program to see if this has potential to be a more affordable option for you. I have also sent a referral to [facility] who can help you explore your options regarding medication affordability.   Link: [email or web address].[email or web address]  I will let you know if your lab results are abnormal.  Please plan to follow up in 1 month for BP recheck. Please bring your home blood pressure cuff at that time.  My best, Dr. August Saucer

## 2023-04-29 NOTE — Assessment & Plan Note (Signed)
Tetanus booster and flu shots given today.

## 2023-04-29 NOTE — Assessment & Plan Note (Signed)
OP regimen is simvastatin 20 mg daily which she is compliant with. Lipid panel last checked 06/2022 with LDL above goal at 164.  Plan:Lipid panel today. Continue current regimen.

## 2023-05-01 LAB — LIPID PANEL
Chol/HDL Ratio: 3.2 {ratio} (ref 0.0–4.4)
Cholesterol, Total: 188 mg/dL (ref 100–199)
HDL: 59 mg/dL (ref >39)
LDL Chol Calc (NIH): 118 mg/dL — ABNORMAL HIGH (ref 0–99)
Triglycerides: 61 mg/dL (ref 0–149)
VLDL Cholesterol Cal: 11 mg/dL (ref 5–40)

## 2023-05-01 LAB — MICROALBUMIN / CREATININE URINE RATIO
Creatinine, Urine: 294 mg/dL
Microalb/Creat Ratio: 6 mg/g{creat} (ref 0–29)
Microalbumin, Urine: 17.8 ug/mL

## 2023-05-02 NOTE — Progress Notes (Signed)
Internal Medicine Clinic Attending  Case discussed with the resident at the time of the visit.  We reviewed the resident's history and exam and pertinent patient test results.  I agree with the assessment, diagnosis, and plan of care documented in the resident's note.  

## 2023-05-09 ENCOUNTER — Other Ambulatory Visit: Payer: Self-pay | Admitting: Internal Medicine

## 2023-05-09 MED ORDER — SIMVASTATIN 40 MG PO TABS: 40.0000 mg | ORAL_TABLET | Freq: Every evening | ORAL | 11 refills | Status: DC

## 2023-05-17 ENCOUNTER — Ambulatory Visit (INDEPENDENT_AMBULATORY_CARE_PROVIDER_SITE_OTHER): Payer: Self-pay | Admitting: Licensed Clinical Social Worker

## 2023-05-17 DIAGNOSIS — F339 Major depressive disorder, recurrent, unspecified: Secondary | ICD-10-CM

## 2023-05-17 NOTE — BH Specialist Note (Signed)
 Integrated Behavioral Health via Telemedicine Visit  05/17/2023 Courtney Clayton [id number]  Number of Integrated Behavioral Health Clinician visits: No data recorded Session Start time: 900  Session End time: 930 Total time in minutes: 30  Referring Provider: PCP Patient/Family location: Home Courtney Clayton Provider location: Office All persons participating in visit: Courtney Clayton and Patient Types of Service: Individual psychotherapy  I connected with Courtney Clayton Alert via  Telephone and verified that I am speaking with Courtney correct person using two identifiers. Discussed confidentiality: Yes   I discussed Courtney limitations of telemedicine and Courtney availability of in person appointments.  Discussed there is a possibility of technology failure and discussed alternative modes of communication if that failure occurs.  I discussed that engaging in this telemedicine visit, they consent to Courtney provision of behavioral healthcare and Courtney Clayton will be billed under their insurance.  Patient and/or legal guardian expressed understanding and consented to Telemedicine visit: Yes   Presenting Concerns: Patient and/or family reports Courtney following symptoms/concerns: Courtney Courtney Clayton (LCSW-A), acting as a Courtney Clayton), initiated a session with patient. Courtney Clayton introduced themselves, explained her role, and provided contact information to Courtney patient. Confidentiality and mandated reporting were discussed, and Courtney patient denied any suicidal ideations or intent to harm others. Courtney Integrated Behavioral Health (IBH) approach was reviewed, and a PHQ-9 assessment was completed.   Patient and/or Family's Strengths/Protective Factors: Social connections  Standardized Assessments completed: PHQ-SADS    12/21/2022    8:54 AM 07/13/2022   11:09 AM 07/13/2022   11:06 AM  PHQ-SADS Last 3 Score only  Total GAD-7 Score  9   PHQ Adolescent Score 18  14     Patient and/or Family  Response: Patient agrees to Clayton  Assessment: Patient currently experiencing Depression.   Patient may benefit from OPT.  Plan: Follow up with behavioral health clinician on : Within Courtney next 60 days.   I discussed Courtney assessment and treatment plan with Courtney patient and/or parent/guardian. They were provided an opportunity to ask questions and all were answered. They agreed with Courtney plan and demonstrated an understanding of Courtney instructions.   They were advised to call back or seek an in-person evaluation if Courtney symptoms worsen or if Courtney condition fails to improve as anticipated.  Courtney Clayton, MSW, LCSW-A She/Her Behavioral Health Clinician Courtney Clayton  Internal Medicine Center Direct Dial:[phone]  Fax [phone] Main Office Phone: [phone] [address] [place] [place], Courtney Clayton [address] Website: Courtney Clayton  Courtney Clayton, Courtney Clayton  Courtney Clayton

## 2023-05-17 NOTE — BH Specialist Note (Deleted)
 Patient no-showed today's appointment; appointment was for Telephone visit at 9:00am. Mailbox was full/  Behavioral Health Clinician [facility] from here on out)  attempted patient twice via Telephone number  [phone]    [facility] contacted patient from telephone number [phone].    Patient will need to reschedule appointment by calling Internal medicine center [phone].  Renda Pontes, MSW, LCSW-A She/Her Behavioral Health Clinician [facility]  Internal Medicine Center Direct Dial:[phone]  Fax [phone] Main Office Phone: [phone] [address] [place] [place], [place] [address] Website: [facility] Internal Medicine [facility]  [place], [place]  [place]

## 2023-05-27 ENCOUNTER — Encounter: Payer: Self-pay | Admitting: Student

## 2023-05-27 NOTE — Progress Notes (Deleted)
   Established Patient Office Visit  Subjective   Patient ID: Courtney Clayton, female    DOB: 10-07-1970  Age: 53 y.o. MRN: [id number]  No chief complaint on file.   Courtney Clayton is a 53 y.o. who presents to the clinic for a one month follow up on HTN. Please see problem based assessment and plan for additional details.   Patient Active Problem List   Diagnosis Date Noted   Encounter for immunization 04/29/2023   Vaginal itching 12/21/2022   Hypertension 07/13/2022   Hyperlipidemia associated with type 2 diabetes mellitus (HCC) 07/13/2022   Asthma 10/03/2021   Morbid obesity with BMI of 50.0-59.9, adult (HCC) 10/03/2021   Controlled type 2 diabetes mellitus without complication, without long-term current use of insulin (HCC) 10/03/2021   Depression 10/03/2021      Objective:     There were no vitals taken for this visit. BP Readings from Last 3 Encounters:  04/29/23 (!) 149/84  12/21/22 128/74  12/05/22 (!) 164/95      Physical Exam   No results found for any visits on 05/27/23.  Last CBC Lab Results  Component Value Date   WBC 10.5 12/05/2022   HGB 13.1 12/05/2022   HCT 42.7 12/05/2022   MCV 83.1 12/05/2022   MCH 25.5 (L) 12/05/2022   RDW 15.0 12/05/2022   PLT 309 12/05/2022   Last metabolic panel Lab Results  Component Value Date   GLUCOSE 106 (H) 12/21/2022   NA 139 12/21/2022   K 3.9 12/21/2022   CL 99 12/21/2022   CO2 23 12/21/2022   BUN 12 12/21/2022   CREATININE 1.07 (H) 12/21/2022   EGFR 63 12/21/2022   CALCIUM 9.5 12/21/2022   PROT 6.8 06/24/2022   ALBUMIN 3.4 (L) 06/24/2022   BILITOT 0.3 06/24/2022   ALKPHOS 59 06/24/2022   AST 22 06/24/2022   ALT 14 06/24/2022   ANIONGAP 12 12/05/2022      The 10-year ASCVD risk score (Arnett DK, et al., 2019) is: 13.3%    Assessment & Plan:   Problem List Items Addressed This Visit   None  HTN Todays BP: Meds?  amlodipine 10 mg daily, hydrochlorothiazide 12.5 mg daily, losartan 100  mg daily, metoprolol 75 mg  Getting meds ok? Check at home?  Cuff today? BMP  No follow-ups on file.    Courtney Rogue, DO

## 2023-06-13 ENCOUNTER — Encounter: Payer: Self-pay | Admitting: Student

## 2023-06-13 ENCOUNTER — Other Ambulatory Visit (HOSPITAL_COMMUNITY): Payer: Self-pay

## 2023-06-13 NOTE — Progress Notes (Deleted)
   Established Patient Office Visit  Subjective   Patient ID: Courtney Clayton, female    DOB: 1970-06-20  Age: 53 y.o. MRN: [id number]  No chief complaint on file.   Courtney Clayton is a 53 y.o. who presents to the clinic for a follow up of HTN. Please see problem based assessment and plan for additional details.  Patient Active Problem List   Diagnosis Date Noted   Encounter for immunization 04/29/2023   Vaginal itching 12/21/2022   Hypertension 07/13/2022   Hyperlipidemia associated with type 2 diabetes mellitus (HCC) 07/13/2022   Asthma 10/03/2021   Morbid obesity with BMI of 50.0-59.9, adult (HCC) 10/03/2021   Controlled type 2 diabetes mellitus without complication, without long-term current use of insulin (HCC) 10/03/2021   Depression 10/03/2021      Objective:     There were no vitals taken for this visit. BP Readings from Last 3 Encounters:  04/29/23 (!) 149/84  12/21/22 128/74  12/05/22 (!) 164/95   Wt Readings from Last 3 Encounters:  04/29/23 (!) 316 lb 12.8 oz (143.7 kg)  12/21/22 298 lb 6.4 oz (135.4 kg)  12/05/22 (!) 308 lb (139.7 kg)      Physical Exam   No results found for any visits on 06/13/23.  Last metabolic panel Lab Results  Component Value Date   GLUCOSE 106 (H) 12/21/2022   NA 139 12/21/2022   K 3.9 12/21/2022   CL 99 12/21/2022   CO2 23 12/21/2022   BUN 12 12/21/2022   CREATININE 1.07 (H) 12/21/2022   EGFR 63 12/21/2022   CALCIUM 9.5 12/21/2022   PROT 6.8 06/24/2022   ALBUMIN 3.4 (L) 06/24/2022   BILITOT 0.3 06/24/2022   ALKPHOS 59 06/24/2022   AST 22 06/24/2022   ALT 14 06/24/2022   ANIONGAP 12 12/05/2022   Last hemoglobin A1c Lab Results  Component Value Date   HGBA1C 7.3 (A) 04/29/2023      The 10-year ASCVD risk score (Arnett DK, et al., 2019) is: 13.3%    Assessment & Plan:   Problem List Items Addressed This Visit   None  HTN Todays BP: Meds?  Meds today amlodipine 10 mg daily, hydrochlorothiazide  12.5 mg daily, losartan 100 mg daily, metoprolol 75 mg  Getting meds ok? Check at home?  Cuff today? BMP StopBang score?     No follow-ups on file.    Faith Rogue, DO

## 2023-06-21 ENCOUNTER — Encounter: Payer: Self-pay | Admitting: Internal Medicine

## 2023-06-21 ENCOUNTER — Other Ambulatory Visit (HOSPITAL_COMMUNITY): Payer: Self-pay

## 2023-06-21 ENCOUNTER — Telehealth: Payer: Self-pay

## 2023-06-21 ENCOUNTER — Ambulatory Visit (INDEPENDENT_AMBULATORY_CARE_PROVIDER_SITE_OTHER): Payer: Self-pay | Admitting: Internal Medicine

## 2023-06-21 ENCOUNTER — Telehealth: Payer: Self-pay | Admitting: *Deleted

## 2023-06-21 DIAGNOSIS — I10 Essential (primary) hypertension: Secondary | ICD-10-CM

## 2023-06-21 DIAGNOSIS — F32A Depression, unspecified: Secondary | ICD-10-CM

## 2023-06-21 DIAGNOSIS — Z7984 Long term (current) use of oral hypoglycemic drugs: Secondary | ICD-10-CM

## 2023-06-21 DIAGNOSIS — F339 Major depressive disorder, recurrent, unspecified: Secondary | ICD-10-CM

## 2023-06-21 DIAGNOSIS — E119 Type 2 diabetes mellitus without complications: Secondary | ICD-10-CM

## 2023-06-21 MED ORDER — CITALOPRAM HYDROBROMIDE 20 MG PO TABS
20.0000 mg | ORAL_TABLET | Freq: Every day | ORAL | 1 refills | Status: DC
  Filled 2023-06-21 – 2023-07-28 (×2): qty 30, 30d supply, fill #0

## 2023-06-21 MED ORDER — LOSARTAN POTASSIUM 100 MG PO TABS
100.0000 mg | ORAL_TABLET | Freq: Every day | ORAL | 3 refills | Status: DC
  Filled 2023-06-21 – 2023-07-28 (×2): qty 30, 30d supply, fill #0

## 2023-06-21 MED ORDER — AMLODIPINE BESYLATE 10 MG PO TABS
10.0000 mg | ORAL_TABLET | Freq: Every day | ORAL | 3 refills | Status: DC
Start: 2023-06-21 — End: 2024-01-27
  Filled 2023-06-21 – 2023-07-28 (×2): qty 30, 30d supply, fill #0

## 2023-06-21 MED ORDER — METOPROLOL TARTRATE 75 MG PO TABS
75.0000 mg | ORAL_TABLET | Freq: Two times a day (BID) | ORAL | 3 refills | Status: DC
  Filled 2023-06-21 – 2023-07-28 (×2): qty 60, 30d supply, fill #0

## 2023-06-21 MED ORDER — HYDROCHLOROTHIAZIDE 25 MG PO TABS
25.0000 mg | ORAL_TABLET | Freq: Every day | ORAL | 3 refills | Status: DC
Start: 2023-06-21 — End: 2024-01-21
  Filled 2023-06-21 – 2023-07-28 (×2): qty 30, 30d supply, fill #0

## 2023-06-21 MED ORDER — EMPAGLIFLOZIN 10 MG PO TABS
10.0000 mg | ORAL_TABLET | Freq: Every day | ORAL | 3 refills | Status: DC
Start: 2023-06-21 — End: 2024-01-21
  Filled 2023-06-21 – 2023-07-28 (×3): qty 30, 30d supply, fill #0

## 2023-06-21 NOTE — Patient Instructions (Signed)
Thank you, Ms.[name] for allowing [place] to provide your care today. Today we discussed:  High blood pressure Increase your hydrochlorothiazide to 25 mg daily Keep taking losartan, amlodipine, and metoprolol like you have been Come back in 4 weeks to recheck BP Diabetes  Keep taking jardiance I will reach out to see if there is any patient assistance for the injectable meds for you  I have ordered the following labs for you:   Lab Orders         BMP8+Anion Gap        Referrals ordered today:   Referral Orders  No referral(s) requested today     I have ordered the following medication/changed the following medications:   Stop the following medications: Medications Discontinued During This Encounter  Medication Reason   amLODipine (NORVASC) 10 MG tablet Reorder   hydrochlorothiazide (HYDRODIURIL) 12.5 MG tablet Reorder   losartan (COZAAR) 100 MG tablet Reorder   Metoprolol Tartrate 75 MG TABS Reorder   empagliflozin (JARDIANCE) 10 MG TABS tablet Reorder     Start the following medications: Meds ordered this encounter  Medications   empagliflozin (JARDIANCE) 10 MG TABS tablet    Sig: Take 1 tablet (10 mg total) by mouth daily before breakfast.    Dispense:  30 tablet    Refill:  3    IM Program   amLODipine (NORVASC) 10 MG tablet    Sig: Take 1 tablet (10 mg total) by mouth daily.    Dispense:  30 tablet    Refill:  3    IM Program   hydrochlorothiazide (HYDRODIURIL) 25 MG tablet    Sig: Take 1 tablet (25 mg total) by mouth daily.    Dispense:  30 tablet    Refill:  3    IM program   losartan (COZAAR) 100 MG tablet    Sig: Take 1 tablet (100 mg total) by mouth daily.    Dispense:  30 tablet    Refill:  3    IM Program   Metoprolol Tartrate 75 MG TABS    Sig: Take 1 tablet (75 mg total) by mouth 2 (two) times daily.    Dispense:  60 tablet    Refill:  3    IM Program     Follow up:  4 weeks     Should you have any questions or concerns please  call the internal medicine clinic at [phone].     Elza Rafter, D.O. [facility] Internal [facility] Center

## 2023-06-21 NOTE — Progress Notes (Signed)
 Pharmacy Medication Assistance Program Note    06/21/2023  Patient ID: Courtney Clayton, female   DOB: 05-30-1970, 53 y.o.   MRN: [id number]     06/21/2023  [employer]reach [employer]  Man[employer]urer [employer]  [employer] Jardiance  Type of Animal nutritionist sent to pharmacy [facility]) to give to patient at pickup.

## 2023-06-21 NOTE — Assessment & Plan Note (Signed)
PHQ-9 filled out at end of visit and remains elevated at 18.  She is on Celexa 20 mg daily. Could consider increasing dose at next office visit if patient is interested.

## 2023-06-21 NOTE — Assessment & Plan Note (Signed)
The patient presents for 1 month follow-up of her hypertension.  Her blood pressure was initially elevated to 137/98, and then was 153/79 upon recheck.  Her meds include amlodipine 10 mg daily, HCTZ 12.5 mg daily, losartan 100 mg daily, and metoprolol 75 mg twice daily.  The patient checks her blood pressure at home with a wrist cuff and notes that her blood pressures are consistently elevated, with systolics no lower than 140.  She denies any headaches, chest pain, palpitations, or dyspnea.  Plan: -Increase HCTZ to 25 mg daily - Continue amlodipine, losartan, and metoprolol as above - BMP today - Follow-up in 4 weeks

## 2023-06-21 NOTE — Telephone Encounter (Signed)
Spoke with patient(driving) then called and left voice message for patient to contact [facility] [phone] to schedule mammogram if have not heard from their office.

## 2023-06-21 NOTE — Assessment & Plan Note (Signed)
Last A1c in December was 7.3%.  The patient is on Jardiance milligrams daily, and stopped taking her metformin, as she did not tolerate this.  She did previously tolerate a GLP-1 agonist in the past, but she does not have insurance and it is cost prohibitive to her.  She tried to get patient assistance for the [employer], but states that she did not qualify for this.  I have sent a message to [facility] to see if we can see if she qualifies for patient assistance for Ozempic/Mounjaro.  Plan: -Continue Jardiance -Trying to get patient GLP-1, working with patient assistance

## 2023-06-21 NOTE — Progress Notes (Signed)
Internal Medicine Clinic Attending  Case discussed with the resident at the time of the visit.  We reviewed the resident's history and exam and pertinent patient test results.  I agree with the assessment, diagnosis, and plan of care documented in the resident's note.

## 2023-06-21 NOTE — Telephone Encounter (Signed)
Medication no longer on IM program. Patient uninsured and will need patient assistance.

## 2023-06-21 NOTE — Progress Notes (Signed)
CC: hypertension  HPI:  [name] is a 53 y.o. female living with a history stated below and presents today for a 1 month follow up of her BP. Please see problem based assessment and plan for additional details.  Past Medical History:  Diagnosis Date   Asthma    CHF (congestive heart failure) (HCC)    Depression    Diabetes mellitus without complication (HCC)    Hypertension    Hypertensive urgency 10/03/2021   Hypertensive urgency 10/03/2021   MI, old    Vaginal itching 12/21/2022    Current Outpatient Medications on File Prior to Visit  Medication Sig Dispense Refill   albuterol (PROVENTIL) (2.5 MG/3ML) 0.083% nebulizer solution Take 3 mLs (2.5 mg total) by nebulization every 2 (two) hours as needed for wheezing. 75 mL 12   allopurinol (ZYLOPRIM) 100 MG tablet Take 1 tablet (100 mg total) by mouth daily. 90 tablet 0   Blood Glucose Monitoring Suppl (BLOOD GLUCOSE MONITOR SYSTEM) w/Device KIT Use to test blood sugar in the morning, at noon, and at bedtime. 1 kit 0   calcium carbonate (TUMS - DOSED IN MG ELEMENTAL CALCIUM) 500 MG chewable tablet Chew 2 tablets (400 mg of elemental calcium total) by mouth daily as needed for indigestion or heartburn. 30 tablet 3   cetirizine (ZYRTEC ALLERGY) 10 MG tablet Take 1 tablet (10 mg total) by mouth at bedtime as needed  for allergies or rhinitis. 100 tablet 0   ipratropium-albuterol (DUONEB) 0.5-2.5 (3) MG/3ML SOLN Take 3 mLs by nebulization every 4 (four) hours as needed. (Patient taking differently: Take 3 mLs by nebulization every 4 (four) hours as needed (sob/wheezing).) 360 mL 0   liraglutide (VICTOZA) 18 MG/3ML SOPN Inject 0.6 mg into the skin daily. 3 mL 1   Peak Flow Meter DEVI 1 Device by Does not apply route as needed. 1 each 0   simvastatin (ZOCOR) 40 MG tablet Take 1 tablet (40 mg total) by mouth every evening. 30 tablet 11   zolpidem (AMBIEN CR) 12.5 MG CR tablet Take 1 tablet by mouth at bedtime as needed for sleep.  30 tablet 3   No current facility-administered medications on file prior to visit.    No family history on file.  Social History   Socioeconomic History   Marital status: Divorced    Spouse name: Not on file   Number of children: Not on file   Years of education: Not on file   Highest education level: Not on file  Occupational History   Not on file  Tobacco Use   Smoking status: Never   Smokeless tobacco: Never  Vaping Use   Vaping status: Never Used  Substance and Sexual Activity   Alcohol use: Not Currently    Comment: rarely   Drug use: Never   Sexual activity: Not on file  Other Topics Concern   Not on file  Social History Narrative   Not on file   Social Drivers of Health   Financial Resource Strain: Not on file  Food Insecurity: Not on file  Transportation Needs: Not on file  Physical Activity: Not on file  Stress: Not on file  Social Connections: Unknown (03/01/2023)   Received from [facility]   Social Network    Social Network: Not on file  Intimate Partner Violence: Not At Risk (03/01/2023)   Received from [facility]   HITS    Over the last 12 months how often did your partner physically hurt you?:  Never    Over the last 12 months how often did your partner insult you or talk down to you?: Never    Over the last 12 months how often did your partner threaten you with physical harm?: Never    Over the last 12 months how often did your partner scream or curse at you?: Never    Review of Systems: ROS negative except for what is noted on the assessment and plan.  Vitals:   06/21/23 0931 06/21/23 0948  BP: (!) 137/98 (!) 153/79  Pulse: 66 61  Temp: 98.7 F (37.1 C)   TempSrc: Oral   SpO2: 99%   Weight: (!) 319 lb 12.8 oz (145.1 kg)   Height: 5\' 5"  (1.651 m)     Physical Exam: Constitutional: well-appearing, obese female sitting in chair, in no acute distress Cardiovascular: regular rate and rhythm, no m/r/g Pulmonary/Chest: normal work of  breathing on room air MSK: normal bulk and tone sugars Skin: warm and dry Psych: normal mood and behavior  Assessment & Plan:    Patient discussed with Dr.  Deirdre Priest  Hypertension The patient presents for 1 month follow-up of her hypertension.  Her blood pressure was initially elevated to 137/98, and then was 153/79 upon recheck.  Her meds include amlodipine 10 mg daily, HCTZ 12.5 mg daily, losartan 100 mg daily, and metoprolol 75 mg twice daily.  The patient checks her blood pressure at home with a wrist cuff and notes that her blood pressures are consistently elevated, with systolics no lower than 140.  She denies any headaches, chest pain, palpitations, or dyspnea.  Plan: -Increase HCTZ to 25 mg daily - Continue amlodipine, losartan, and metoprolol as above - BMP today - Follow-up in 4 weeks  Controlled type 2 diabetes mellitus without complication, without long-term current use of insulin (HCC) Last A1c in December was 7.3%.  The patient is on Jardiance milligrams daily, and stopped taking her metformin, as she did not tolerate this.  She did previously tolerate a GLP-1 agonist in the past, but she does not have insurance and it is cost prohibitive to her.  She tried to get patient assistance for the [employer], but states that she did not qualify for this.  I have sent a message to [facility] to see if we can see if she qualifies for patient assistance for Ozempic/Mounjaro.  Plan: -Continue Jardiance -Trying to get patient GLP-1, working with patient assistance  Depression PHQ-9 filled out at end of visit and remains elevated at 18.  She is on Celexa 20 mg daily. Could consider increasing dose at next office visit if patient is interested.   Elza Rafter, D.O. [facility] Internal Medicine, PGY-3 Phone: [phone] Date 06/21/2023 Time 10:35 AM

## 2023-06-22 LAB — BMP8+ANION GAP
Anion Gap: 15 mmol/L (ref 10.0–18.0)
BUN/Creatinine Ratio: 16 (ref 9–23)
BUN: 14 mg/dL (ref 6–24)
CO2: 23 mmol/L (ref 20–29)
Calcium: 9.1 mg/dL (ref 8.7–10.2)
Chloride: 102 mmol/L (ref 96–106)
Creatinine, Ser: 0.89 mg/dL (ref 0.57–1.00)
Glucose: 155 mg/dL — ABNORMAL HIGH (ref 70–99)
Potassium: 3.9 mmol/L (ref 3.5–5.2)
Sodium: 140 mmol/L (ref 134–144)
eGFR: 78 mL/min/{1.73_m2} (ref >59)

## 2023-06-22 NOTE — Progress Notes (Signed)
Called about nml kidney function.

## 2023-07-01 ENCOUNTER — Other Ambulatory Visit (HOSPITAL_COMMUNITY): Payer: Self-pay

## 2023-07-19 ENCOUNTER — Ambulatory Visit: Payer: Self-pay | Admitting: Student

## 2023-07-19 VITALS — BP 147/82 | HR 55 | Temp 98.7°F | Ht 65.0 in | Wt 321.9 lb

## 2023-07-19 DIAGNOSIS — I10 Essential (primary) hypertension: Secondary | ICD-10-CM

## 2023-07-19 DIAGNOSIS — Z7984 Long term (current) use of oral hypoglycemic drugs: Secondary | ICD-10-CM

## 2023-07-19 DIAGNOSIS — E119 Type 2 diabetes mellitus without complications: Secondary | ICD-10-CM

## 2023-07-19 DIAGNOSIS — M79602 Pain in left arm: Secondary | ICD-10-CM

## 2023-07-19 LAB — POCT GLYCOSYLATED HEMOGLOBIN (HGB A1C): Hemoglobin A1C: 7.3 % — AB (ref 4.0–5.6)

## 2023-07-19 LAB — GLUCOSE, CAPILLARY: Glucose-Capillary: 131 mg/dL — ABNORMAL HIGH (ref 70–99)

## 2023-07-19 MED ORDER — DICLOFENAC SODIUM 1 % EX GEL
2.0000 g | Freq: Four times a day (QID) | CUTANEOUS | 0 refills | Status: DC
Start: 2023-07-19 — End: 2024-01-27

## 2023-07-19 NOTE — Patient Instructions (Signed)
 Thank you so much for coming to the clinic today!   For your blood pressure, we are going to check some other labs today to help determine what the cause is. If those are fine, we will need to start you on another blood pressure medication. Will also look into getting you the ozempic.   For your arm pain, I'm ok with starting a gel to rub on that area, in the meanwhile I think wrist splinting will really help.   If you have any questions please feel free to the call the clinic at anytime at [phone]. It was a pleasure seeing you!  Best, Dr. Thomasene Ripple

## 2023-07-19 NOTE — Progress Notes (Addendum)
 CC: Diabetes and blood pressure follow up  HPI:  [name] is a 53 y.o. female living with a history stated below and presents today for a follow up regarding her diabetes and blood pressure. Please see problem based assessment and plan for additional details.  Past Medical History:  Diagnosis Date   Asthma    CHF (congestive heart failure) (HCC)    Depression    Diabetes mellitus without complication (HCC)    Hypertension    Hypertensive urgency 10/03/2021   Hypertensive urgency 10/03/2021   MI, old    Vaginal itching 12/21/2022    Current Outpatient Medications on File Prior to Visit  Medication Sig Dispense Refill   albuterol (PROVENTIL) (2.5 MG/3ML) 0.083% nebulizer solution Take 3 mLs (2.5 mg total) by nebulization every 2 (two) hours as needed for wheezing. 75 mL 12   allopurinol (ZYLOPRIM) 100 MG tablet Take 1 tablet (100 mg total) by mouth daily. 90 tablet 0   amLODipine (NORVASC) 10 MG tablet Take 1 tablet (10 mg total) by mouth daily. 30 tablet 3   Blood Glucose Monitoring Suppl (BLOOD GLUCOSE MONITOR SYSTEM) w/Device KIT Use to test blood sugar in the morning, at noon, and at bedtime. 1 kit 0   cetirizine (ZYRTEC ALLERGY) 10 MG tablet Take 1 tablet (10 mg total) by mouth at bedtime as needed  for allergies or rhinitis. 100 tablet 0   citalopram (CELEXA) 20 MG tablet Take 1 tablet (20 mg total) by mouth daily. 30 tablet 1   empagliflozin (JARDIANCE) 10 MG TABS tablet Take 1 tablet (10 mg total) by mouth daily before breakfast. 30 tablet 3   hydrochlorothiazide (HYDRODIURIL) 25 MG tablet Take 1 tablet (25 mg total) by mouth daily. 30 tablet 3   ipratropium-albuterol (DUONEB) 0.5-2.5 (3) MG/3ML SOLN Take 3 mLs by nebulization every 4 (four) hours as needed. (Patient taking differently: Take 3 mLs by nebulization every 4 (four) hours as needed (sob/wheezing).) 360 mL 0   liraglutide (VICTOZA) 18 MG/3ML SOPN Inject 0.6 mg into the skin daily. 3 mL 1   losartan  (COZAAR) 100 MG tablet Take 1 tablet (100 mg total) by mouth daily. 30 tablet 3   Metoprolol Tartrate 75 MG TABS Take 1 tablet (75 mg total) by mouth 2 (two) times daily. 60 tablet 3   Peak Flow Meter DEVI 1 Device by Does not apply route as needed. 1 each 0   simvastatin (ZOCOR) 40 MG tablet Take 1 tablet (40 mg total) by mouth every evening. 30 tablet 11   zolpidem (AMBIEN CR) 12.5 MG CR tablet Take 1 tablet by mouth at bedtime as needed for sleep. 30 tablet 3   No current facility-administered medications on file prior to visit.    No family history on file.  Social History   Socioeconomic History   Marital status: Divorced    Spouse name: Not on file   Number of children: Not on file   Years of education: Not on file   Highest education level: Not on file  Occupational History   Not on file  Tobacco Use   Smoking status: Never   Smokeless tobacco: Never  Vaping Use   Vaping status: Never Used  Substance and Sexual Activity   Alcohol use: Not Currently    Comment: rarely   Drug use: Never   Sexual activity: Not on file  Other Topics Concern   Not on file  Social History Narrative   Not on file   Social  Drivers of Corporate investment banker Strain: Not on file  Food Insecurity: Not on file  Transportation Needs: Not on file  Physical Activity: Not on file  Stress: Not on file  Social Connections: Unknown (03/01/2023)   [facility]   Social Network    Social Network: Not on file  Intimate Partner Violence: Not At Risk (03/01/2023)   Received from [facility]   HITS    Over the last 12 months how often did your partner physically hurt you?: Never    Over the last 12 months how often did your partner insult you or talk down to you?: Never    Over the last 12 months how often did your partner threaten you with physical harm?: Never    Over the last 12 months how often did your partner scream or curse at you?: Never    Review of Systems: ROS  negative except for what is noted on the assessment and plan.  Vitals:   07/19/23 0844 07/19/23 0909  BP: (!) 154/86 (!) 147/82  Pulse: 60 (!) 55  Temp: 98.7 F (37.1 C)   TempSrc: Oral   SpO2: 98%   Weight: (!) 321 lb 14.4 oz (146 kg)   Height: 5\' 5"  (1.651 m)     Physical Exam: Constitutional: well-appearing female in no acute distress Cardiovascular: regular rate and rhythm, no m/r/g Pulmonary/Chest: normal work of breathing on room air, lungs clear to auscultation bilaterally MSK: Tinel and Phalen test positive, decreased range of motion of the left shoulder limited by pain Neurological: alert & oriented x 3, 5/5 strength in bilateral upper and lower extremities, normal gait  Assessment & Plan:   Hypertension Patient presents for follow-up regarding her blood pressure.  Today is 154/86 recheck is 147/82.  Her current regimen is amlodipine 10 mg, losartan 100 mg, hydrochlorothiazide 25 mg, and metoprolol 75 mg twice a day.  She took all of these except her hydrochlorothiazide today.  Home blood pressure readings are similar to what is being read in the clinic.  I think her hypertension is most likely multifactorial, with obesity, and likely underlying sleep apnea.  However cannot rule out secondary hypertension without renin Aldo, so we will check that today.  For now, I think she would benefit from an additional agent (making the 5 total), will start on spironolactone once renin Aldo comes back.  Plan: - BMP today given recent increase of hydrochlorothiazide to 25 mg - Check renin and Aldo - If creatinine although within normal limits, will start spironolactone  ADDENDUM:  Renin/aldo ratio significantly elevated at 31.1, indicating primary aldosteronism. Will start pt on spironolactone for this. Her last CT showed normal adrenal glands, hopefully can manage this medically, and blood pressure is better at next visit to titrate off some of her medications.   Controlled type 2  diabetes mellitus without complication, without long-term current use of insulin (HCC) Last A1c was 7.3, today A1c is 7.3.  Only need regimen is Jardiance 10 mg, as she was not able to tolerate metformin with side effects including nausea, vomiting, abdominal pain.  She does qualify for Ozempic via patient assistance program Novo nor disc.  Encouraged patient to reach out to the to initiate Ozempic.  Plan: - Continue Jardiance 10 mg, consider up titration if patient is unable to get the Ozempic at next visit  Left arm pain For the last 3 weeks patient has been complaining of left arm pain.  She says it starts  in her wrist and moves up her arm.  She states that there is significant numbness and tingling of her left arm as well at random points throughout the day.  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  She denies any trauma to the arm.  She describes the pain as achy, with intermittent sharp tingling feeling radiating from her wrist to her shoulder.  On my exam, she has limited mobility of her left shoulder with external and internal rotation limited due to pain.  Phalen and Tinel test are both positive on the left arm.  I think she likely has cervical radiculopathy from her shoulder, with a mix of carpal tunnel.  Unfortunately cannot refer to physical therapy for cervical radiculopathy given lack of insurance, so we will treat conservatively with Voltaren gel.  Also recommended wrist splinting for her left wrist.  Patient discussed with Dr. Kae Heller Marley Pakula, M.D. [facility] Internal Medicine, PGY-2 Pager: [phone] Date 07/27/2023 Time 11:08 AM

## 2023-07-19 NOTE — Assessment & Plan Note (Addendum)
 Patient presents for follow-up regarding her blood pressure.  Today is 154/86 recheck is 147/82.  Her current regimen is amlodipine 10 mg, losartan 100 mg, hydrochlorothiazide 25 mg, and metoprolol 75 mg twice a day.  She took all of these except her hydrochlorothiazide today.  Home blood pressure readings are similar to what is being read in the clinic.  I think her hypertension is most likely multifactorial, with obesity, and likely underlying sleep apnea.  However cannot rule out secondary hypertension without renin Aldo, so we will check that today.  For now, I think she would benefit from an additional agent (making the 5 total), will start on spironolactone once renin Aldo comes back.  Plan: - BMP today given recent increase of hydrochlorothiazide to 25 mg - Check renin and Aldo - If creatinine although within normal limits, will start spironolactone  ADDENDUM:  Renin/aldo ratio significantly elevated at 31.1, indicating primary aldosteronism. Will start pt on spironolactone for this. Her last CT showed normal adrenal glands, hopefully can manage this medically, and blood pressure is better at next visit to titrate off some of her medications.

## 2023-07-19 NOTE — Assessment & Plan Note (Signed)
 For the last 3 weeks patient has been complaining of left arm pain.  She says it starts in her wrist and moves up her arm.  She states that there is significant numbness and tingling of her left arm as well at random points throughout the day.  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  She denies any trauma to the arm.  She describes the pain as achy, with intermittent sharp tingling feeling radiating from her wrist to her shoulder.  On my exam, she has limited mobility of her left shoulder with external and internal rotation limited due to pain.  Phalen and Tinel test are both positive on the left arm.  I think she likely has cervical radiculopathy from her shoulder, with a mix of carpal tunnel.  Unfortunately cannot refer to physical therapy for cervical radiculopathy given lack of insurance, so we will treat conservatively with Voltaren gel.  Also recommended wrist splinting for her left wrist.

## 2023-07-19 NOTE — Assessment & Plan Note (Addendum)
 Last A1c was 7.3, today A1c is 7.3.  Only need regimen is Jardiance 10 mg, as she was not able to tolerate metformin with side effects including nausea, vomiting, abdominal pain.  She does qualify for Ozempic via patient assistance program Novo nor disc.  Encouraged patient to reach out to the to initiate Ozempic.  Plan: - Continue Jardiance 10 mg, consider up titration if patient is unable to get the Ozempic at next visit

## 2023-07-19 NOTE — Progress Notes (Signed)
 Internal Medicine Clinic Attending  Case discussed with the resident at the time of the visit.  We reviewed the resident's history and exam and pertinent patient test results.  I agree with the assessment, diagnosis, and plan of care documented in the resident's note.

## 2023-07-26 LAB — BMP8+ANION GAP
Anion Gap: 14 mmol/L (ref 10.0–18.0)
BUN/Creatinine Ratio: 18 (ref 9–23)
BUN: 13 mg/dL (ref 6–24)
CO2: 23 mmol/L (ref 20–29)
Calcium: 8.8 mg/dL (ref 8.7–10.2)
Chloride: 104 mmol/L (ref 96–106)
Creatinine, Ser: 0.73 mg/dL (ref 0.57–1.00)
Glucose: 130 mg/dL — ABNORMAL HIGH (ref 70–99)
Potassium: 3.8 mmol/L (ref 3.5–5.2)
Sodium: 141 mmol/L (ref 134–144)
eGFR: 100 mL/min/{1.73_m2} (ref >59)

## 2023-07-26 LAB — ALDOSTERONE + RENIN ACTIVITY W/ RATIO: Aldosterone: 5.2 ng/dL (ref 0.0–30.0)

## 2023-07-27 ENCOUNTER — Other Ambulatory Visit (HOSPITAL_COMMUNITY): Payer: Self-pay

## 2023-07-27 MED ORDER — SPIRONOLACTONE 25 MG PO TABS
25.0000 mg | ORAL_TABLET | Freq: Every day | ORAL | 3 refills | Status: DC
  Filled 2023-07-27: qty 30, 30d supply, fill #0

## 2023-07-27 NOTE — Addendum Note (Signed)
 Addended by: Olegario Messier on: 07/27/2023 11:08 AM   Modules accepted: Orders

## 2023-07-28 ENCOUNTER — Other Ambulatory Visit: Payer: Self-pay | Admitting: Student

## 2023-07-28 ENCOUNTER — Other Ambulatory Visit (HOSPITAL_COMMUNITY): Payer: Self-pay

## 2023-07-28 ENCOUNTER — Other Ambulatory Visit: Payer: Self-pay

## 2023-07-28 MED ORDER — DICLOFENAC SODIUM 1 % EX GEL
2.0000 g | Freq: Four times a day (QID) | CUTANEOUS | 0 refills | Status: DC
Start: 2023-07-19 — End: 2024-01-27
  Filled 2023-07-28: qty 100, 7d supply, fill #0

## 2023-07-28 MED ORDER — SIMVASTATIN 40 MG PO TABS
40.0000 mg | ORAL_TABLET | Freq: Every evening | ORAL | 11 refills | Status: DC
Start: 2023-05-09 — End: 2024-01-27
  Filled 2023-07-28: qty 30, 30d supply, fill #0

## 2023-07-29 ENCOUNTER — Other Ambulatory Visit: Payer: Self-pay

## 2023-07-29 ENCOUNTER — Other Ambulatory Visit (HOSPITAL_COMMUNITY): Payer: Self-pay

## 2023-07-29 MED ORDER — ALLOPURINOL 100 MG PO TABS
100.0000 mg | ORAL_TABLET | Freq: Every day | ORAL | 0 refills | Status: DC
  Filled 2023-07-29: qty 90, 90d supply, fill #0

## 2023-07-29 MED ORDER — CETIRIZINE HCL 10 MG PO TABS
10.0000 mg | ORAL_TABLET | Freq: Every evening | ORAL | 0 refills | Status: DC | PRN
  Filled 2023-07-29: qty 100, 100d supply, fill #0

## 2023-07-29 NOTE — Telephone Encounter (Signed)
 Medication sent to pharmacy

## 2023-08-19 ENCOUNTER — Encounter: Payer: Self-pay | Admitting: Student

## 2023-08-19 NOTE — Progress Notes (Deleted)
   Established Patient Office Visit  Subjective   Patient ID: Courtney Clayton, female    DOB: 01-06-1971  Age: 53 y.o. MRN: [id number]  No chief complaint on file.   Courtney Clayton is a 53 y.o. who presents to the clinic for a one month follow up of secondary hypertension 2/2 primary aldosteronism. She was started on spironolactone 25 mg daily. Please see problem based assessment and plan for additional details.   Patient presents with a history of hypertension with a blood pressure today of***. Their hypertension is***controlled on a regimen of***,***,***.  Prior BMP in***was***.  Patient***symptoms of hypotension at home.  They***check their blood pressure at home with an average of***. Plan: -***Continue current regimen of: -BMP today   Patient Active Problem List   Diagnosis Date Noted   Left arm pain 07/19/2023   Hypertension 07/13/2022   Hyperlipidemia associated with type 2 diabetes mellitus (HCC) 07/13/2022   Asthma 10/03/2021   Morbid obesity with BMI of 50.0-59.9, adult (HCC) 10/03/2021   Controlled type 2 diabetes mellitus without complication, without long-term current use of insulin (HCC) 10/03/2021   Depression 10/03/2021       Objective:     There were no vitals taken for this visit. BP Readings from Last 3 Encounters:  07/19/23 (!) 147/82  06/21/23 (!) 153/79  04/29/23 (!) 149/84   Wt Readings from Last 3 Encounters:  07/19/23 (!) 321 lb 14.4 oz (146 kg)  06/21/23 (!) 319 lb 12.8 oz (145.1 kg)  04/29/23 (!) 316 lb 12.8 oz (143.7 kg)      Physical Exam   No results found for any visits on 08/19/23.  Last metabolic panel Lab Results  Component Value Date   GLUCOSE 130 (H) 07/19/2023   NA 141 07/19/2023   K 3.8 07/19/2023   CL 104 07/19/2023   CO2 23 07/19/2023   BUN 13 07/19/2023   CREATININE 0.73 07/19/2023   EGFR 100 07/19/2023   CALCIUM 8.8 07/19/2023   PROT 6.8 06/24/2022   ALBUMIN 3.4 (L) 06/24/2022   BILITOT 0.3 06/24/2022    ALKPHOS 59 06/24/2022   AST 22 06/24/2022   ALT 14 06/24/2022   ANIONGAP 12 12/05/2022   Last lipids Lab Results  Component Value Date   CHOL 188 04/29/2023   HDL 59 04/29/2023   LDLCALC 118 (H) 04/29/2023   TRIG 61 04/29/2023   CHOLHDL 3.2 04/29/2023   Last hemoglobin A1c Lab Results  Component Value Date   HGBA1C 7.3 (A) 07/19/2023      The 10-year ASCVD risk score (Arnett DK, et al., 2019) is: 12.7%    Assessment & Plan:   Problem List Items Addressed This Visit   None   No follow-ups on file.    Faith Rogue, DO

## 2023-10-19 ENCOUNTER — Other Ambulatory Visit: Payer: Self-pay

## 2023-10-19 ENCOUNTER — Emergency Department (HOSPITAL_COMMUNITY)
Admission: EM | Admit: 2023-10-19 | Discharge: 2023-10-20 | Disposition: A | Discharge status: Home / Self Care | Payer: Self-pay | Attending: Emergency Medicine | Admitting: Emergency Medicine

## 2023-10-19 ENCOUNTER — Emergency Department (HOSPITAL_COMMUNITY): Payer: Self-pay

## 2023-10-19 ENCOUNTER — Encounter (HOSPITAL_COMMUNITY): Payer: Self-pay | Admitting: *Deleted

## 2023-10-19 DIAGNOSIS — Z79899 Other long term (current) drug therapy: Secondary | ICD-10-CM | POA: Insufficient documentation

## 2023-10-19 DIAGNOSIS — R079 Chest pain, unspecified: Secondary | ICD-10-CM | POA: Insufficient documentation

## 2023-10-19 DIAGNOSIS — R519 Headache, unspecified: Secondary | ICD-10-CM | POA: Insufficient documentation

## 2023-10-19 DIAGNOSIS — I10 Essential (primary) hypertension: Secondary | ICD-10-CM | POA: Insufficient documentation

## 2023-10-19 LAB — LIPASE, BLOOD: Lipase: 29 U/L (ref 11–51)

## 2023-10-19 LAB — HEPATIC FUNCTION PANEL
ALT: 14 U/L (ref 0–44)
AST: 22 U/L (ref 15–41)
Albumin: 2.7 g/dL — ABNORMAL LOW (ref 3.5–5.0)
Alkaline Phosphatase: 68 U/L (ref 38–126)
Bilirubin, Direct: <0.1 mg/dL (ref 0.0–0.2)
Total Bilirubin: 0.3 mg/dL (ref 0.0–1.2)
Total Protein: 6.1 g/dL — ABNORMAL LOW (ref 6.5–8.1)

## 2023-10-19 LAB — CBC
HCT: 41.7 % (ref 36.0–46.0)
Hemoglobin: 13.2 g/dL (ref 12.0–15.0)
MCH: 26.7 pg (ref 26.0–34.0)
MCHC: 31.7 g/dL (ref 30.0–36.0)
MCV: 84.4 fL (ref 80.0–100.0)
Platelets: 301 10*3/uL (ref 150–400)
RBC: 4.94 MIL/uL (ref 3.87–5.11)
RDW: 14.6 % (ref 11.5–15.5)
WBC: 10.3 10*3/uL (ref 4.0–10.5)
nRBC: 0 % (ref 0.0–0.2)

## 2023-10-19 LAB — BASIC METABOLIC PANEL WITH GFR
Anion gap: 10 (ref 5–15)
BUN: 11 mg/dL (ref 6–20)
CO2: 24 mmol/L (ref 22–32)
Calcium: 8.9 mg/dL (ref 8.9–10.3)
Chloride: 103 mmol/L (ref 98–111)
Creatinine, Ser: 0.95 mg/dL (ref 0.44–1.00)
GFR, Estimated: >60 mL/min (ref >60)
Glucose, Bld: 226 mg/dL — ABNORMAL HIGH (ref 70–99)
Potassium: 3.9 mmol/L (ref 3.5–5.1)
Sodium: 137 mmol/L (ref 135–145)

## 2023-10-19 LAB — TROPONIN I (HIGH SENSITIVITY)
Troponin I (High Sensitivity): 4 ng/L (ref <18)
Troponin I (High Sensitivity): 3 ng/L (ref <18)

## 2023-10-19 MED ORDER — LOSARTAN POTASSIUM 50 MG PO TABS
100.0000 mg | ORAL_TABLET | Freq: Once | ORAL | Status: AC
  Administered 2023-10-19: 100 mg via ORAL
  Filled 2023-10-19: qty 2

## 2023-10-19 MED ORDER — AMLODIPINE BESYLATE 5 MG PO TABS
10.0000 mg | ORAL_TABLET | Freq: Once | ORAL | Status: AC
  Administered 2023-10-19: 10 mg via ORAL
  Filled 2023-10-19: qty 2

## 2023-10-19 MED ORDER — IOHEXOL 350 MG/ML SOLN
100.0000 mL | Freq: Once | INTRAVENOUS | Status: AC | PRN
  Administered 2023-10-19: 100 mL via INTRAVENOUS

## 2023-10-19 NOTE — ED Triage Notes (Signed)
 The pt is c/o chest pain and shortness of breath for 3-4 days

## 2023-10-19 NOTE — ED Notes (Signed)
 Patient transported to CT

## 2023-10-19 NOTE — ED Notes (Signed)
 Pt placed on CCMD monitor.
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
## 2023-10-19 NOTE — ED Provider Triage Note (Signed)
 Emergency Medicine Provider Triage Evaluation Note  Courtney Clayton , a 53 y.o. female  was evaluated in triage.  Pt complains of chest pain.  Started 3 days ago.  Also with shortness of breath and a headache.  She states she has been off her blood pressure medicine for 2 months due to affordability issues.  Chest pain is in the center of her chest and does radiate to the back and feels like someone sitting on my chest.  Review of Systems  Positive: See above Negative: See above  Physical Exam  BP (!) 215/117   Pulse 77   Temp 99.1 F (37.3 C)   Resp 20   Ht 5' 5 (1.651 m)   Wt (!) 146 kg   SpO2 100%   BMI 53.56 kg/m  Gen:   Awake, no distress   Resp:  Normal effort  MSK:   Moves extremities without difficulty  Other:    Medical Decision Making  Medically screening exam initiated at 7:34 PM.  Appropriate orders placed.  Courtney Clayton was informed that the remainder of the evaluation will be completed by another provider, this initial triage assessment does not replace that evaluation, and the importance of remaining in the ED until their evaluation is complete.  Work up started   Janalee Mcmurray, PA-C 10/19/23 2059

## 2023-10-20 ENCOUNTER — Other Ambulatory Visit (HOSPITAL_COMMUNITY): Payer: Self-pay

## 2023-10-20 MED ORDER — LOSARTAN POTASSIUM 100 MG PO TABS
100.0000 mg | ORAL_TABLET | Freq: Every day | ORAL | 1 refills | Status: DC
  Filled 2023-10-20: qty 30, 30d supply, fill #0

## 2023-10-20 MED ORDER — AMLODIPINE BESYLATE 10 MG PO TABS
10.0000 mg | ORAL_TABLET | Freq: Every day | ORAL | 1 refills | Status: DC
  Filled 2023-10-20: qty 30, 30d supply, fill #0

## 2023-10-20 NOTE — Discharge Instructions (Addendum)

## 2023-10-31 ENCOUNTER — Other Ambulatory Visit (HOSPITAL_COMMUNITY): Payer: Self-pay

## 2023-11-01 ENCOUNTER — Other Ambulatory Visit (HOSPITAL_COMMUNITY): Payer: Self-pay

## 2024-01-21 ENCOUNTER — Emergency Department (HOSPITAL_COMMUNITY)

## 2024-01-21 ENCOUNTER — Emergency Department (HOSPITAL_COMMUNITY)
Admission: EM | Admit: 2024-01-21 | Discharge: 2024-01-21 | Disposition: A | Discharge status: Home / Self Care | Attending: Emergency Medicine | Admitting: Emergency Medicine

## 2024-01-21 ENCOUNTER — Encounter (HOSPITAL_COMMUNITY): Payer: Self-pay

## 2024-01-21 DIAGNOSIS — I129 Hypertensive chronic kidney disease with stage 1 through stage 4 chronic kidney disease, or unspecified chronic kidney disease: Secondary | ICD-10-CM | POA: Insufficient documentation

## 2024-01-21 DIAGNOSIS — N189 Chronic kidney disease, unspecified: Secondary | ICD-10-CM | POA: Diagnosis not present

## 2024-01-21 DIAGNOSIS — Z79899 Other long term (current) drug therapy: Secondary | ICD-10-CM | POA: Insufficient documentation

## 2024-01-21 DIAGNOSIS — E119 Type 2 diabetes mellitus without complications: Secondary | ICD-10-CM

## 2024-01-21 DIAGNOSIS — R739 Hyperglycemia, unspecified: Secondary | ICD-10-CM

## 2024-01-21 DIAGNOSIS — R519 Headache, unspecified: Secondary | ICD-10-CM | POA: Diagnosis present

## 2024-01-21 DIAGNOSIS — I10 Essential (primary) hypertension: Secondary | ICD-10-CM

## 2024-01-21 DIAGNOSIS — I160 Hypertensive urgency: Secondary | ICD-10-CM

## 2024-01-21 LAB — I-STAT CHEM 8, ED
BUN: 9 mg/dL (ref 6–20)
Calcium, Ion: 1.13 mmol/L — ABNORMAL LOW (ref 1.15–1.40)
Chloride: 98 mmol/L (ref 98–111)
Creatinine, Ser: 0.8 mg/dL (ref 0.44–1.00)
Glucose, Bld: 322 mg/dL — ABNORMAL HIGH (ref 70–99)
HCT: 43 % (ref 36.0–46.0)
Hemoglobin: 14.6 g/dL (ref 12.0–15.0)
Potassium: 3.8 mmol/L (ref 3.5–5.1)
Sodium: 136 mmol/L (ref 135–145)
TCO2: 26 mmol/L (ref 22–32)

## 2024-01-21 LAB — URINALYSIS, W/ REFLEX TO CULTURE (INFECTION SUSPECTED)
Bilirubin Urine: NEGATIVE
Glucose, UA: >=500 mg/dL — AB
Ketones, ur: NEGATIVE mg/dL
Leukocytes,Ua: NEGATIVE
Nitrite: NEGATIVE
Protein, ur: NEGATIVE mg/dL
Specific Gravity, Urine: 1.017 (ref 1.005–1.030)
pH: 5 (ref 5.0–8.0)

## 2024-01-21 LAB — CBC WITH DIFFERENTIAL/PLATELET
Abs Immature Granulocytes: 0.06 K/uL (ref 0.00–0.07)
Basophils Absolute: 0 K/uL (ref 0.0–0.1)
Basophils Relative: 0 %
Eosinophils Absolute: 0.1 K/uL (ref 0.0–0.5)
Eosinophils Relative: 1 %
HCT: 43.2 % (ref 36.0–46.0)
Hemoglobin: 13.1 g/dL (ref 12.0–15.0)
Immature Granulocytes: 1 %
Lymphocytes Relative: 45 %
Lymphs Abs: 4.3 K/uL — ABNORMAL HIGH (ref 0.7–4.0)
MCH: 25.3 pg — ABNORMAL LOW (ref 26.0–34.0)
MCHC: 30.3 g/dL (ref 30.0–36.0)
MCV: 83.6 fL (ref 80.0–100.0)
Monocytes Absolute: 0.8 K/uL (ref 0.1–1.0)
Monocytes Relative: 9 %
Neutro Abs: 4.2 K/uL (ref 1.7–7.7)
Neutrophils Relative %: 44 %
Platelets: 278 K/uL (ref 150–400)
RBC: 5.17 MIL/uL — ABNORMAL HIGH (ref 3.87–5.11)
RDW: 14.4 % (ref 11.5–15.5)
WBC: 9.6 K/uL (ref 4.0–10.5)
nRBC: 0 % (ref 0.0–0.2)

## 2024-01-21 LAB — COMPREHENSIVE METABOLIC PANEL WITH GFR
ALT: 18 U/L (ref 0–44)
AST: 27 U/L (ref 15–41)
Albumin: 3.8 g/dL (ref 3.5–5.0)
Alkaline Phosphatase: 90 U/L (ref 38–126)
Anion gap: 15 (ref 5–15)
BUN: 9 mg/dL (ref 6–20)
CO2: 23 mmol/L (ref 22–32)
Calcium: 9.1 mg/dL (ref 8.9–10.3)
Chloride: 97 mmol/L — ABNORMAL LOW (ref 98–111)
Creatinine, Ser: 0.81 mg/dL (ref 0.44–1.00)
GFR, Estimated: >60 mL/min (ref >60)
Glucose, Bld: 311 mg/dL — ABNORMAL HIGH (ref 70–99)
Potassium: 3.6 mmol/L (ref 3.5–5.1)
Sodium: 135 mmol/L (ref 135–145)
Total Bilirubin: 0.5 mg/dL (ref 0.0–1.2)
Total Protein: 6.9 g/dL (ref 6.5–8.1)

## 2024-01-21 LAB — TROPONIN T, HIGH SENSITIVITY
Troponin T High Sensitivity: <15 ng/L (ref 0–19)
Troponin T High Sensitivity: <15 ng/L (ref 0–19)

## 2024-01-21 MED ORDER — LOSARTAN POTASSIUM 100 MG PO TABS: 100.0000 mg | ORAL_TABLET | Freq: Every day | ORAL | 3 refills | Status: AC

## 2024-01-21 MED ORDER — LABETALOL HCL 5 MG/ML IV SOLN
20.0000 mg | Freq: Once | INTRAVENOUS | Status: AC
  Administered 2024-01-21: 20 mg via INTRAVENOUS
  Filled 2024-01-21: qty 4

## 2024-01-21 MED ORDER — HYDROCHLOROTHIAZIDE 25 MG PO TABS: 25.0000 mg | ORAL_TABLET | Freq: Every day | ORAL | 3 refills | Status: DC

## 2024-01-21 MED ORDER — EMPAGLIFLOZIN 10 MG PO TABS
10.0000 mg | ORAL_TABLET | Freq: Every day | ORAL | 3 refills | Status: DC
Start: 2024-01-21 — End: 2024-01-27

## 2024-01-21 MED ORDER — AMLODIPINE BESYLATE 10 MG PO TABS: 10.0000 mg | ORAL_TABLET | Freq: Every day | ORAL | 1 refills | Status: AC

## 2024-01-21 NOTE — ED Provider Notes (Signed)
  EMERGENCY DEPARTMENT AT [facility] Provider Note   CSN: [id number] Arrival date & time: 01/21/24  9186     Patient presents with: Hypertension   Courtney KEEVEN is a 53 y.o. female.   53 year old female presents with increased blood pressure.  States that she has been out of her regular blood pressure medication for several weeks due to financial issues.  Patient has a history of chronic kidney disease.  Has been using her relatives lisinopril.  Notes that she has had headache with nausea and vomiting which began today.  Patient told nurse that she had been having some intermittent chest pain going to right shoulder.  When?  This she said it felt like she was uncomfortable.  No anginal type qualities to it.  Does endorse some increased edema of her lower extremities..  She has not been short of breath.  No back discomfort.  Denies any focal weakness.  States her home blood pressures have been running systolics in the 220s and diastolics in the 110s.       Prior to Admission medications   Medication Sig Start Date End Date Taking? Authorizing Provider  albuterol  (PROVENTIL ) (2.5 MG/3ML) 0.083% nebulizer solution Take 3 mLs (2.5 mg total) by nebulization every 2 (two) hours as needed for wheezing. 10/04/21   Samtani, Jai-Gurmukh, MD  allopurinol  (ZYLOPRIM ) 100 MG tablet Take 1 tablet (100 mg total) by mouth daily. 07/29/23 10/27/23  Nooruddin, Saad, MD  amLODipine  (NORVASC ) 10 MG tablet Take 1 tablet (10 mg total) by mouth daily. 06/21/23   Atway, Rayann N, DO  amLODipine  (NORVASC ) 10 MG tablet Take 1 tablet (10 mg total) by mouth daily. 10/20/23   Armenta Canning, MD  Blood Glucose Monitoring Suppl (BLOOD GLUCOSE MONITOR SYSTEM) w/Device KIT Use to test blood sugar in the morning, at noon, and at bedtime. 06/24/22   Rizwan, Saima, MD  cetirizine  (ZYRTEC  ALLERGY) 10 MG tablet Take 1 tablet (10 mg total) by mouth at bedtime as needed  for allergies or rhinitis. 07/29/23    Nooruddin, Saad, MD  citalopram  (CELEXA ) 20 MG tablet Take 1 tablet (20 mg total) by mouth daily. 06/21/23   Atway, Rayann N, DO  diclofenac  Sodium (VOLTAREN  ARTHRITIS PAIN) 1 % GEL Apply 2 g topically 4 (four) times daily. 07/19/23   Nooruddin, Saad, MD  diclofenac  Sodium (VOLTAREN ) 1 % GEL Apply 2 g topically to the affected area 4 (four) times daily. 07/19/23     empagliflozin  (JARDIANCE ) 10 MG TABS tablet Take 1 tablet (10 mg total) by mouth daily before breakfast. 06/21/23   Atway, Rayann N, DO  hydrochlorothiazide  (HYDRODIURIL ) 25 MG tablet Take 1 tablet (25 mg total) by mouth daily. 06/21/23   Atway, Rayann N, DO  ipratropium-albuterol  (DUONEB) 0.5-2.5 (3) MG/3ML SOLN Take 3 mLs by nebulization every 4 (four) hours as needed. Patient taking differently: Take 3 mLs by nebulization every 4 (four) hours as needed (sob/wheezing). 04/29/22   Kehrli, Kelsey F, PA-C  liraglutide  (VICTOZA ) 18 MG/3ML SOPN Inject 0.6 mg into the skin daily. 04/29/23   Addie Perkins, DO  losartan  (COZAAR ) 100 MG tablet Take 1 tablet (100 mg total) by mouth daily. 06/21/23   Atway, Rayann N, DO  losartan  (COZAAR ) 100 MG tablet Take 1 tablet (100 mg total) by mouth daily. 10/20/23   Armenta Canning, MD  Metoprolol  Tartrate 75 MG TABS Take 1 tablet (75 mg total) by mouth 2 (two) times daily. 06/21/23   Atway, Rayann N, DO  Peak Flow  Meter DEVI 1 Device by Does not apply route as needed. 10/04/21   Samtani, Jai-Gurmukh, MD  simvastatin  (ZOCOR ) 40 MG tablet Take 1 tablet (40 mg total) by mouth every evening. 05/09/23 05/08/24  Addie Perkins, DO  simvastatin  (ZOCOR ) 40 MG tablet Take 1 tablet (40 mg total) by mouth every evening. 05/09/23     spironolactone  (ALDACTONE ) 25 MG tablet Take 1 tablet (25 mg total) by mouth daily. 07/27/23   Nooruddin, Saad, MD  zolpidem  (AMBIEN  CR) 12.5 MG CR tablet Take 1 tablet by mouth at bedtime as needed for sleep. 06/24/22   Rizwan, Saima, MD    Allergies: Sulfa  antibiotics    Review of Systems  All  other systems reviewed and are negative.   Updated Vital Signs BP (!) 217/125 (BP Location: Right Arm)   Pulse 78   Temp 98.5 F (36.9 C) (Oral)   Resp 20   Ht 1.651 m (5' 5)   Wt (!) 147.4 kg   SpO2 100%   BMI 54.08 kg/m   Physical Exam Vitals and nursing note reviewed.  Constitutional:      General: She is not in acute distress.    Appearance: Normal appearance. She is well-developed. She is not toxic-appearing.  HENT:     Head: Normocephalic and atraumatic.  Eyes:     General: Lids are normal.     Conjunctiva/sclera: Conjunctivae normal.     Pupils: Pupils are equal, round, and reactive to light.  Neck:     Thyroid: No thyroid mass.     Trachea: No tracheal deviation.  Cardiovascular:     Rate and Rhythm: Normal rate and regular rhythm.     Heart sounds: Normal heart sounds. No murmur heard.    No gallop.  Pulmonary:     Effort: Pulmonary effort is normal. No respiratory distress.     Breath sounds: Normal breath sounds. No stridor. No decreased breath sounds, wheezing, rhonchi or rales.  Abdominal:     General: There is no distension.     Palpations: Abdomen is soft.     Tenderness: There is no abdominal tenderness. There is no rebound.  Musculoskeletal:        General: No tenderness. Normal range of motion.     Cervical back: Normal range of motion and neck supple.  Skin:    General: Skin is warm and dry.     Findings: No abrasion or rash.  Neurological:     Mental Status: She is alert and oriented to person, place, and time. Mental status is at baseline.     GCS: GCS eye subscore is 4. GCS verbal subscore is 5. GCS motor subscore is 6.     Cranial Nerves: No cranial nerve deficit.     Sensory: No sensory deficit.     Motor: Motor function is intact.  Psychiatric:        Attention and Perception: Attention normal.        Speech: Speech normal.        Behavior: Behavior normal.     (all labs ordered are listed, but only abnormal results are  displayed) Labs Reviewed  CBC WITH DIFFERENTIAL/PLATELET  COMPREHENSIVE METABOLIC PANEL WITH GFR  URINALYSIS, W/ REFLEX TO CULTURE (INFECTION SUSPECTED)  I-STAT CHEM 8, ED  TROPONIN T, HIGH SENSITIVITY    EKG: EKG Interpretation Date/Time:  Saturday January 21 2024 08:20:16 EDT Ventricular Rate:  78 PR Interval:  159 QRS Duration:  90 QT Interval:  392 QTC Calculation: 447 R  Axis:   -75  Text Interpretation: Sinus rhythm Left anterior fascicular block Abnormal R-wave progression, late transition Borderline T abnormalities, diffuse leads No significant change since last tracing Confirmed by Dasie Faden ([id number]) on 01/21/2024 8:56:31 AM  Radiology: No results found.   Procedures   Medications Ordered in the ED - No data to display                                  Medical Decision Making Amount and/or Complexity of Data Reviewed Labs: ordered. Radiology: ordered. ECG/medicine tests: ordered.  Risk Prescription drug management.   Patient treated with labetalol  20 mg IV push.  Marked improvement in her blood pressure.  Patient complained of headache and Toradol .  Had a head CT which showed no evidence of intracranial hemorrhage.  Neurological exam is stable.  Do not feel that she has subarachnoid.  Her headache has been going on for several days and waxes and wanes.  She has no signs of endorgan damage at this time.  No protein or urine.  Her renal function today is actually normal.  Patient's blood sugar elevated at 322 but she has no evidence of DKA.  She did complain of some vague chest discomfort and had cardiac enzymes which were negative.  Chest x-ray also did not show any acute findings.  Patient able to ambulate in the department without assistance.  Plan will be to restart patient on her home medications and return precautions given  CRITICAL CARE Performed by: Faden ONEIDA Dasie Total critical care time: 50 minutes Critical care time was exclusive of separately  billable procedures and treating other patients. Critical care was necessary to treat or prevent imminent or life-threatening deterioration. Critical care was time spent personally by me on the following activities: development of treatment plan with patient and/or surrogate as well as nursing, discussions with consultants, evaluation of patient's response to treatment, examination of patient, obtaining history from patient or surrogate, ordering and performing treatments and interventions, ordering and review of laboratory studies, ordering and review of radiographic studies, pulse oximetry and re-evaluation of patient's condition.      Final diagnoses:  None    ED Discharge Orders     None          Dasie Faden, MD 01/21/24 1059

## 2024-01-21 NOTE — Discharge Instructions (Signed)
 Call your doctor on Monday to schedule a follow-up visit for your blood pressure management

## 2024-01-21 NOTE — ED Triage Notes (Signed)
 Pt in for symptomatic hypertension. She reports headache, nausea, vomiting, and vision changes. She also reports intermittent chest pain that radiates to her right shoulder. Pt also states that leg swelling is present. She reports non compliance with her medication due to financial burden. VAN negative.

## 2024-01-26 ENCOUNTER — Emergency Department (HOSPITAL_COMMUNITY)

## 2024-01-26 ENCOUNTER — Other Ambulatory Visit: Payer: Self-pay

## 2024-01-26 ENCOUNTER — Encounter (HOSPITAL_COMMUNITY): Payer: Self-pay

## 2024-01-26 ENCOUNTER — Ambulatory Visit: Admitting: Student

## 2024-01-26 ENCOUNTER — Observation Stay (HOSPITAL_COMMUNITY)
Admission: EM | Admit: 2024-01-26 | Discharge: 2024-01-27 | Disposition: A | Discharge status: Home / Self Care | Attending: Emergency Medicine | Admitting: Emergency Medicine

## 2024-01-26 ENCOUNTER — Encounter: Payer: Self-pay | Admitting: Student

## 2024-01-26 VITALS — BP 202/108 | HR 97 | Temp 98.6°F | Ht 65.0 in | Wt 322.2 lb

## 2024-01-26 DIAGNOSIS — I161 Hypertensive emergency: Principal | ICD-10-CM | POA: Insufficient documentation

## 2024-01-26 DIAGNOSIS — Z23 Encounter for immunization: Secondary | ICD-10-CM

## 2024-01-26 DIAGNOSIS — E785 Hyperlipidemia, unspecified: Secondary | ICD-10-CM | POA: Insufficient documentation

## 2024-01-26 DIAGNOSIS — Z6841 Body Mass Index (BMI) 40.0 and over, adult: Secondary | ICD-10-CM | POA: Insufficient documentation

## 2024-01-26 DIAGNOSIS — Z79899 Other long term (current) drug therapy: Secondary | ICD-10-CM | POA: Diagnosis not present

## 2024-01-26 DIAGNOSIS — I509 Heart failure, unspecified: Secondary | ICD-10-CM | POA: Insufficient documentation

## 2024-01-26 DIAGNOSIS — E119 Type 2 diabetes mellitus without complications: Secondary | ICD-10-CM | POA: Diagnosis not present

## 2024-01-26 DIAGNOSIS — I10 Essential (primary) hypertension: Secondary | ICD-10-CM | POA: Diagnosis present

## 2024-01-26 DIAGNOSIS — G43109 Migraine with aura, not intractable, without status migrainosus: Secondary | ICD-10-CM | POA: Insufficient documentation

## 2024-01-26 DIAGNOSIS — I152 Hypertension secondary to endocrine disorders: Secondary | ICD-10-CM

## 2024-01-26 DIAGNOSIS — R208 Other disturbances of skin sensation: Secondary | ICD-10-CM | POA: Diagnosis not present

## 2024-01-26 DIAGNOSIS — R110 Nausea: Secondary | ICD-10-CM | POA: Diagnosis not present

## 2024-01-26 DIAGNOSIS — E269 Hyperaldosteronism, unspecified: Secondary | ICD-10-CM

## 2024-01-26 DIAGNOSIS — G43E09 Chronic migraine with aura, not intractable, without status migrainosus: Secondary | ICD-10-CM | POA: Diagnosis not present

## 2024-01-26 DIAGNOSIS — J45909 Unspecified asthma, uncomplicated: Secondary | ICD-10-CM | POA: Insufficient documentation

## 2024-01-26 DIAGNOSIS — I110 Hypertensive heart disease with heart failure: Secondary | ICD-10-CM | POA: Diagnosis not present

## 2024-01-26 LAB — BASIC METABOLIC PANEL WITH GFR
Anion gap: 12 (ref 5–15)
BUN: 11 mg/dL (ref 6–20)
CO2: 21 mmol/L — ABNORMAL LOW (ref 22–32)
Calcium: 8.9 mg/dL (ref 8.9–10.3)
Chloride: 102 mmol/L (ref 98–111)
Creatinine, Ser: 0.91 mg/dL (ref 0.44–1.00)
GFR, Estimated: >60 mL/min (ref >60)
Glucose, Bld: 237 mg/dL — ABNORMAL HIGH (ref 70–99)
Potassium: 3.7 mmol/L (ref 3.5–5.1)
Sodium: 135 mmol/L (ref 135–145)

## 2024-01-26 LAB — CBC
HCT: 43.5 % (ref 36.0–46.0)
Hemoglobin: 13.7 g/dL (ref 12.0–15.0)
MCH: 26.1 pg (ref 26.0–34.0)
MCHC: 31.5 g/dL (ref 30.0–36.0)
MCV: 82.9 fL (ref 80.0–100.0)
Platelets: 291 K/uL (ref 150–400)
RBC: 5.25 MIL/uL — ABNORMAL HIGH (ref 3.87–5.11)
RDW: 14.5 % (ref 11.5–15.5)
WBC: 7.9 K/uL (ref 4.0–10.5)
nRBC: 0 % (ref 0.0–0.2)

## 2024-01-26 LAB — TROPONIN I (HIGH SENSITIVITY)
Troponin I (High Sensitivity): 3 ng/L (ref <18)
Troponin I (High Sensitivity): 4 ng/L (ref <18)

## 2024-01-26 LAB — I-STAT CHEM 8, ED
BUN: 11 mg/dL (ref 6–20)
Calcium, Ion: 1.01 mmol/L — ABNORMAL LOW (ref 1.15–1.40)
Chloride: 103 mmol/L (ref 98–111)
Creatinine, Ser: 0.8 mg/dL (ref 0.44–1.00)
Glucose, Bld: 251 mg/dL — ABNORMAL HIGH (ref 70–99)
HCT: 43 % (ref 36.0–46.0)
Hemoglobin: 14.6 g/dL (ref 12.0–15.0)
Potassium: 3.8 mmol/L (ref 3.5–5.1)
Sodium: 136 mmol/L (ref 135–145)
TCO2: 24 mmol/L (ref 22–32)

## 2024-01-26 MED ORDER — PROCHLORPERAZINE EDISYLATE 10 MG/2ML IJ SOLN
10.0000 mg | Freq: Once | INTRAMUSCULAR | Status: AC
  Administered 2024-01-26: 10 mg via INTRAVENOUS
  Filled 2024-01-26: qty 2

## 2024-01-26 MED ORDER — DIPHENHYDRAMINE HCL 50 MG/ML IJ SOLN
25.0000 mg | Freq: Once | INTRAMUSCULAR | Status: AC
  Administered 2024-01-26: 25 mg via INTRAVENOUS
  Filled 2024-01-26: qty 1

## 2024-01-26 MED ORDER — SPIRONOLACTONE 25 MG PO TABS: 25.0000 mg | ORAL_TABLET | Freq: Every day | ORAL | 3 refills | Status: DC

## 2024-01-26 MED ORDER — IOHEXOL 350 MG/ML SOLN
75.0000 mL | Freq: Once | INTRAVENOUS | Status: AC | PRN
  Administered 2024-01-26: 75 mL via INTRAVENOUS

## 2024-01-26 NOTE — ED Provider Notes (Signed)
 [facility] EMERGENCY DEPARTMENT AT [facility] Provider Note   CSN: [id number] Arrival date & time: 01/26/24  1548     Patient presents with: Symptomatic Hypertension   Courtney Clayton is a 53 y.o. female.   HPI     53 year old female with history of hypertension, hyperlipidemia, diabetes comes in with chief complaint of headache, severely high blood pressure, nausea, dizziness.  Patient states that over the last few days she has noted headaches, dizziness.  Today she went to the internal medicine clinic, they advised that she come to the ER.  Patient's BP was over 200.  She has not been compliant with her medication because of financial reasons.  Currently patient is having headaches.  Headaches are described as sharp, throbbing and currently she has sensitivity to light.  That was not the case over the last few days.  She has had migraine headaches in the past.  Patient's review of system is also positive for dizziness, described as feeling lightheaded.  Upon ambulation, she will often feel unsteady and needs assistance.  She has had headache, neck pain as well.  Patient denies any associated double vision, difficulty in swallowing, slurred speech  Prior to Admission medications   Medication Sig Start Date End Date Taking? Authorizing Provider  albuterol  (PROVENTIL ) (2.5 MG/3ML) 0.083% nebulizer solution Take 3 mLs (2.5 mg total) by nebulization every 2 (two) hours as needed for wheezing. 10/04/21   Samtani, Jai-Gurmukh, MD  allopurinol  (ZYLOPRIM ) 100 MG tablet Take 1 tablet (100 mg total) by mouth daily. 07/29/23 10/27/23  Nooruddin, Saad, MD  amLODipine  (NORVASC ) 10 MG tablet Take 1 tablet (10 mg total) by mouth daily. 06/21/23   Atway, Rayann N, DO  amLODipine  (NORVASC ) 10 MG tablet Take 1 tablet (10 mg total) by mouth daily. 01/21/24   Dasie Faden, MD  Blood Glucose Monitoring Suppl (BLOOD GLUCOSE MONITOR SYSTEM) w/Device KIT Use to test blood sugar in the morning, at  noon, and at bedtime. 06/24/22   Rizwan, Saima, MD  cetirizine  (ZYRTEC  ALLERGY) 10 MG tablet Take 1 tablet (10 mg total) by mouth at bedtime as needed  for allergies or rhinitis. 07/29/23   Nooruddin, Saad, MD  citalopram  (CELEXA ) 20 MG tablet Take 1 tablet (20 mg total) by mouth daily. 06/21/23   Atway, Rayann N, DO  diclofenac  Sodium (VOLTAREN  ARTHRITIS PAIN) 1 % GEL Apply 2 g topically 4 (four) times daily. 07/19/23   Nooruddin, Saad, MD  diclofenac  Sodium (VOLTAREN ) 1 % GEL Apply 2 g topically to the affected area 4 (four) times daily. 07/19/23     empagliflozin  (JARDIANCE ) 10 MG TABS tablet Take 1 tablet (10 mg total) by mouth daily before breakfast. 01/21/24   Dasie Faden, MD  hydrochlorothiazide  (HYDRODIURIL ) 25 MG tablet Take 1 tablet (25 mg total) by mouth daily. 01/21/24   Dasie Faden, MD  ipratropium-albuterol  (DUONEB) 0.5-2.5 (3) MG/3ML SOLN Take 3 mLs by nebulization every 4 (four) hours as needed. Patient taking differently: Take 3 mLs by nebulization every 4 (four) hours as needed (sob/wheezing). 04/29/22   Kehrli, Kelsey F, PA-C  liraglutide  (VICTOZA ) 18 MG/3ML SOPN Inject 0.6 mg into the skin daily. 04/29/23   Addie Perkins, DO  losartan  (COZAAR ) 100 MG tablet Take 1 tablet (100 mg total) by mouth daily. 10/20/23   Armenta Canning, MD  losartan  (COZAAR ) 100 MG tablet Take 1 tablet (100 mg total) by mouth daily. 01/21/24   Dasie Faden, MD  Metoprolol  Tartrate 75 MG TABS Take 1 tablet (75 mg  total) by mouth 2 (two) times daily. 06/21/23   Atway, Rayann N, DO  Peak Flow Meter DEVI 1 Device by Does not apply route as needed. 10/04/21   Samtani, Jai-Gurmukh, MD  simvastatin  (ZOCOR ) 40 MG tablet Take 1 tablet (40 mg total) by mouth every evening. 05/09/23 05/08/24  Addie Perkins, DO  simvastatin  (ZOCOR ) 40 MG tablet Take 1 tablet (40 mg total) by mouth every evening. 05/09/23     spironolactone  (ALDACTONE ) 25 MG tablet Take 1 tablet (25 mg total) by mouth daily. 01/26/24   Kandis Perkins, DO   zolpidem  (AMBIEN  CR) 12.5 MG CR tablet Take 1 tablet by mouth at bedtime as needed for sleep. 06/24/22   Rizwan, Saima, MD    Allergies: Sulfa  antibiotics    Review of Systems  All other systems reviewed and are negative.   Updated Vital Signs BP (!) 180/104   Pulse 94   Temp 98.8 F (37.1 C) (Oral)   Resp (!) 25   SpO2 100%   Physical Exam Vitals and nursing note reviewed.  Constitutional:      Appearance: She is well-developed.  HENT:     Head: Atraumatic.  Eyes:     Extraocular Movements: Extraocular movements intact.     Pupils: Pupils are equal, round, and reactive to light.  Cardiovascular:     Rate and Rhythm: Normal rate.  Pulmonary:     Effort: Pulmonary effort is normal.  Musculoskeletal:     Cervical back: Normal range of motion and neck supple.  Skin:    General: Skin is warm and dry.  Neurological:     Mental Status: She is alert and oriented to person, place, and time.     Cranial Nerves: No cranial nerve deficit.     Sensory: No sensory deficit.     Motor: No weakness.     Coordination: Coordination normal.     Gait: Gait abnormal.     (all labs ordered are listed, but only abnormal results are displayed) Labs Reviewed  BASIC METABOLIC PANEL WITH GFR - Abnormal; Notable for the following components:      Result Value   CO2 21 (*)    Glucose, Bld 237 (*)    All other components within normal limits  CBC - Abnormal; Notable for the following components:   RBC 5.25 (*)    All other components within normal limits  I-STAT CHEM 8, ED - Abnormal; Notable for the following components:   Glucose, Bld 251 (*)    Calcium , Ion 1.01 (*)    All other components within normal limits  BRAIN NATRIURETIC PEPTIDE  TROPONIN I (HIGH SENSITIVITY)  TROPONIN I (HIGH SENSITIVITY)    EKG: None  Date: 01/26/2024  Rate: 100  Rhythm: normal sinus rhythm, left axis deviation  QRS Axis: normal  Intervals: normal  ST/T Wave abnormalities: normal  Conduction  Disutrbances: none  Narrative Interpretation: unremarkable   Radiology: CT ANGIO HEAD NECK W WO CM Result Date: 01/26/2024 CLINICAL DATA:  Carotid artery aneurysm suspected EXAM: CT ANGIOGRAPHY HEAD AND NECK WITH AND WITHOUT CONTRAST TECHNIQUE: Multidetector CT imaging of the head and neck was performed using the standard protocol during bolus administration of intravenous contrast. Multiplanar CT image reconstructions and MIPs were obtained to evaluate the vascular anatomy. Carotid stenosis measurements (when applicable) are obtained utilizing NASCET criteria, using the distal internal carotid diameter as the denominator. RADIATION DOSE REDUCTION: This exam was performed according to the departmental dose-optimization program which includes automated exposure control, adjustment  of the mA and/or kV according to patient size and/or use of iterative reconstruction technique. CONTRAST:  75mL OMNIPAQUE  IOHEXOL  350 MG/ML SOLN COMPARISON:  CT head 01/21/2024 FINDINGS: CT HEAD FINDINGS Brain: No evidence of acute infarction, hemorrhage, hydrocephalus, extra-axial collection or mass lesion/mass effect. Vascular: See below. Skull: No acute fracture. Sinuses/Orbits: Clear sinuses.  No acute orbital findings. Other: No mastoid effusions. Review of the MIP images confirms the above findings CTA NECK FINDINGS Aortic arch: Great vessel origins are patent without significant stenosis. Right carotid system: No evidence of dissection, stenosis (50% or greater), or occlusion. Left carotid system: No evidence of dissection, stenosis (50% or greater), or occlusion. Vertebral arteries: Left dominant. No evidence of dissection, stenosis (50% or greater), or occlusion. Skeleton: No acute abnormality on limited assessment. Bridging anterior osteophytes. Other neck: No acute abnormality on limited assessment. Upper chest: Lung apices are clear. Review of the MIP images confirms the above findings CTA HEAD FINDINGS Anterior  circulation: Bilateral intracranial ICAs, MCAs, and ACAs are patent without proximal hemodynamically significant stenosis. No aneurysm identified. Posterior circulation: Bilateral intradural vertebral arteries, basilar artery and bilateral posterior cerebral arteries are patent without proximal hemodynamically significant stenosis. No aneurysm identified. Venous sinuses: As permitted by contrast timing, patent. Review of the MIP images confirms the above findings IMPRESSION: 1. No evidence of acute intracranial abnormality. 2. No large vessel occlusion or proximal hemodynamically significant stenosis. 3. No aneurysm identified. Electronically Signed   By: Gilmore GORMAN Molt M.D.   On: 01/26/2024 18:55   DG Chest 2 View Result Date: 01/26/2024 CLINICAL DATA:  Chest pain. EXAM: CHEST - 2 VIEW COMPARISON:  January 21, 2024. FINDINGS: The heart size and mediastinal contours are within normal limits. Both lungs are clear. The visualized skeletal structures are unremarkable. IMPRESSION: No active cardiopulmonary disease. Electronically Signed   By: Lynwood Landy Raddle M.D.   On: 01/26/2024 18:03     Procedures   Medications Ordered in the ED  iohexol  (OMNIPAQUE ) 350 MG/ML injection 75 mL (75 mLs Intravenous Contrast Given 01/26/24 1832)  prochlorperazine  (COMPAZINE ) injection 10 mg (10 mg Intravenous Given 01/26/24 2330)  diphenhydrAMINE  (BENADRYL ) injection 25 mg (25 mg Intravenous Given 01/26/24 2332)                                    Medical Decision Making Amount and/or Complexity of Data Reviewed Labs: ordered. Radiology: ordered.  Risk Prescription drug management.   53 year old patient comes in with chief complaint of headache, hypertension, chest pain, shortness of breath, dizziness.  I reviewed patient's records including PCP notes  Patient admits to noncompliance with medication, because of financial reasons. Patient noted to have BP over 200 at the PCP.  Currently her BP is systolic  187 mmhg.  I have reviewed patient's records including CT dissection from June 2025 which was negative for acute pathology.  With her having headache, dizziness, elevated blood pressure, differential diagnosis includes hypertensive emergency including brain bleed.  Other possibility includes carotid dissection, vertebral dissection, ischemic stroke and reactive elevated blood pressure.  Patient is complaining of migraine, states that she has some remote migraine history and now having photophobia.  Atypical migraine is also possible.  Adding to this encounter is the complexity of patient's social determinants of health-financial issues leading to patient being noncompliant with her meds  Initial workup included CT angiogram head and neck, basic labs and they are reassuring.  We gave patient headache  cocktail.  Headache is slightly better, she is not headache free.  When she ambulates, she still feels unsteady.  We will request admission.  MRI brain ordered.  If MRI is negative, then her BP can be optimized.  If MRI is positive, then patient will need PT OT evaluation and neuroevaluation, with continued BP optimization per  Final diagnoses:  Hypertensive emergency    ED Discharge Orders     None          Charlyn Sora, MD 01/27/24 0008

## 2024-01-26 NOTE — Assessment & Plan Note (Addendum)
 Patient was seen in the ED on 01/21/2024 for hypertension urgency after she ran out of her hypertension medications due to financial costs. Per patient she was discharged home with amlodipine  10 mg, losartan  100 mg, and hydrochlorothiazide  25 mg. She reports compliance since discharge and took her medications this morning. Today, she reports a headache and blurred vision. She denies chest pain and shortness of breath. Her home blood pressure readings have been in the 200s/100s.   Her blood pressure today is 209/111 rechecked at 202/108. During chart review, it was noted that she has a diagnosis of primary hyperaldosteronism and was unable to afford spironolactone .   Since was sent to the ED for hypertensive urgency for evaluation of hypertensive emergency.   Plan: -START sprinolactone 25 mg  -Continue  amlodipine  10 mg, losartan  100 mg, and hydrochlorothiazide  25 mg, patient was provided instructions of what to do if BP becomes less than 100/60  -Patient was informed of her emergent condition and was instructed to be evaluated in the emergency department. She was aware that it is safer for EMS transportation but preferred to walk across the street to the ED instead. -2 week follow up, will need BMP then

## 2024-01-26 NOTE — H&P (Incomplete)
 Date: 01/26/2024               Patient Name:  Courtney Clayton MRN: [id number]  DOB: [birth year] Age / Sex: 53 y.o., female   PCP: Nelia Dirks, MD         Medical Service: Internal Medicine Teaching Service         Attending Physician: {[id number]}      First Contact: {[id number]}}    Second Contact: {[id number]}         Pager Information: First Contact Pager: [phone]   Second Contact Pager: [phone]   SUBJECTIVE   Chief Complaint: ***  History of Present Illness: Courtney Clayton is a 53 y.o. female with PMH of ***.   Presents with *** Endorses *** Denies ***  *** ED Course: Labs significant for *** Imaging *** Received *** Consulted ***  Meds:  Patient reported: ***  No outpatient medications have been marked as taking for the 01/26/24 encounter [facility] Encounter).    Past Medical History ***  Past Surgical History Past Surgical History:  Procedure Laterality Date  . ABDOMINAL HYSTERECTOMY    . BACK SURGERY    . GANGLION CYST EXCISION    . HERNIA REPAIR    . TONSILLECTOMY       Social:  Lives With: Occupation: Support: Level of Function: PCP: *** Nooruddin, Saad, MD  Substances: -Tobacco: *** -Alcohol: *** -Recreational Drug: ***  Family History: *** History reviewed. No pertinent family history.   Allergies: Allergies as of 01/26/2024 - Review Complete 01/26/2024  Allergen Reaction Noted  . Sulfa  antibiotics Hives 10/06/2017    Review of Systems: A complete ROS was negative except as per HPI.   OBJECTIVE:   Physical Exam: Blood pressure (!) 180/104, pulse 94, temperature 98.8 F (37.1 C), temperature source Oral, resp. rate (!) 25, SpO2 100%.  Constitutional: well-appearing *** sitting in ***, in no acute distress HENT: normocephalic atraumatic, mucous membranes moist Eyes: conjunctiva non-erythematous Neck: supple Cardiovascular: regular rate and rhythm, no m/r/g Pulmonary/Chest:  normal work of breathing on room air, lungs clear to auscultation bilaterally Abdominal: soft, non-tender, non-distended MSK: normal bulk and tone Neurological: alert & oriented x 3, 5/5 strength in bilateral upper and lower extremities, normal gait Skin: warm and dry Psych: ***  Labs: CBC    Component Value Date/Time   WBC 7.9 01/26/2024 1725   RBC 5.25 (H) 01/26/2024 1725   HGB 14.6 01/26/2024 1734   HCT 43.0 01/26/2024 1734   PLT 291 01/26/2024 1725   MCV 82.9 01/26/2024 1725   MCH 26.1 01/26/2024 1725   MCHC 31.5 01/26/2024 1725   RDW 14.5 01/26/2024 1725   LYMPHSABS 4.3 (H) 01/21/2024 0857   MONOABS 0.8 01/21/2024 0857   EOSABS 0.1 01/21/2024 0857   BASOSABS 0.0 01/21/2024 0857     CMP     Component Value Date/Time   NA 136 01/26/2024 1734   NA 141 07/19/2023 0927   K 3.8 01/26/2024 1734   CL 103 01/26/2024 1734   CO2 21 (L) 01/26/2024 1725   GLUCOSE 251 (H) 01/26/2024 1734   BUN 11 01/26/2024 1734   BUN 13 07/19/2023 0927   CREATININE 0.80 01/26/2024 1734   CALCIUM  8.9 01/26/2024 1725   PROT 6.9 01/21/2024 0857   ALBUMIN 3.8 01/21/2024 0857   AST 27 01/21/2024 0857   ALT 18 01/21/2024 0857   ALKPHOS 90 01/21/2024 0857   BILITOT 0.5 01/21/2024 0857   GFRNONAA >60 01/26/2024 1725   GFRAA >60 12/04/2019 2351  Imaging: *** CT ANGIO HEAD NECK W WO CM Result Date: 01/26/2024 CLINICAL DATA:  Carotid artery aneurysm suspected EXAM: CT ANGIOGRAPHY HEAD AND NECK WITH AND WITHOUT CONTRAST TECHNIQUE: Multidetector CT imaging of the head and neck was performed using the standard protocol during bolus administration of intravenous contrast. Multiplanar CT image reconstructions and MIPs were obtained to evaluate the vascular anatomy. Carotid stenosis measurements (when applicable) are obtained utilizing NASCET criteria, using the distal internal carotid diameter as the denominator. RADIATION DOSE REDUCTION: This exam was performed according to the departmental  dose-optimization program which includes automated exposure control, adjustment of the mA and/or kV according to patient size and/or use of iterative reconstruction technique. CONTRAST:  75mL OMNIPAQUE  IOHEXOL  350 MG/ML SOLN COMPARISON:  CT head 01/21/2024 FINDINGS: CT HEAD FINDINGS Brain: No evidence of acute infarction, hemorrhage, hydrocephalus, extra-axial collection or mass lesion/mass effect. Vascular: See below. Skull: No acute fracture. Sinuses/Orbits: Clear sinuses.  No acute orbital findings. Other: No mastoid effusions. Review of the MIP images confirms the above findings CTA NECK FINDINGS Aortic arch: Great vessel origins are patent without significant stenosis. Right carotid system: No evidence of dissection, stenosis (50% or greater), or occlusion. Left carotid system: No evidence of dissection, stenosis (50% or greater), or occlusion. Vertebral arteries: Left dominant. No evidence of dissection, stenosis (50% or greater), or occlusion. Skeleton: No acute abnormality on limited assessment. Bridging anterior osteophytes. Other neck: No acute abnormality on limited assessment. Upper chest: Lung apices are clear. Review of the MIP images confirms the above findings CTA HEAD FINDINGS Anterior circulation: Bilateral intracranial ICAs, MCAs, and ACAs are patent without proximal hemodynamically significant stenosis. No aneurysm identified. Posterior circulation: Bilateral intradural vertebral arteries, basilar artery and bilateral posterior cerebral arteries are patent without proximal hemodynamically significant stenosis. No aneurysm identified. Venous sinuses: As permitted by contrast timing, patent. Review of the MIP images confirms the above findings IMPRESSION: 1. No evidence of acute intracranial abnormality. 2. No large vessel occlusion or proximal hemodynamically significant stenosis. 3. No aneurysm identified. Electronically Signed   By: Gilmore GORMAN Molt M.D.   On: 01/26/2024 18:55   DG Chest 2  View Result Date: 01/26/2024 CLINICAL DATA:  Chest pain. EXAM: CHEST - 2 VIEW COMPARISON:  January 21, 2024. FINDINGS: The heart size and mediastinal contours are within normal limits. Both lungs are clear. The visualized skeletal structures are unremarkable. IMPRESSION: No active cardiopulmonary disease. Electronically Signed   By: Lynwood Landy Raddle M.D.   On: 01/26/2024 18:03     EKG: personally reviewed my interpretation is***. Prior EKG***  ASSESSMENT & PLAN:   Assessment & Plan by Problem: Active Problems:   * No active hospital problems. *   PROSPERITY DARROUGH is a 53 y.o. person living with a history of *** who presented with *** and admitted for *** on hospital day 0  *** ***  *** ***  *** ***  Best practice: Diet: {NAMES:3044014::Normal,Heart Healthy,Carb-Modified,Renal,Carb/Renal,NPO,TPN,Tube Feeds} VTE: {NAMES:3044014::Heparin ,Enoxaparin ,SCDs,DOAC,None} IVF: {NAMES:3044014::None,NS,1/2 NS,LR,D5,D10},{NAMES:3044014::None,10cc/hr,25cc/hr,50cc/hr,75cc/hr,100cc/hr,110cc/hr,125cc/hr,Bolus} Code: {NAMES:3044014::Full,DNR,DNI,DNR/DNI,Comfort Care,Unknown}  Disposition planning: Prior to Admission Living Arrangement: {NAMES:3044014::Home, living ***,SNF, ***,Homeless,***} Anticipated Discharge Location: {NAMES:3044014::Home,SNF,CIR,***}  Dispo: Admit patient to {STATUS:3044014::Observation with expected length of stay less than 2 midnights.,Inpatient with expected length of stay greater than 2 midnights.}  Signed: Benuel Braun, DO Internal Medicine Resident  01/26/2024, 11:58 PM  On Call pager: [phone]

## 2024-01-26 NOTE — ED Provider Notes (Incomplete)
 [facility] [facility] AT [facility] Provider Note   CSN: [id number] Arrival date & time: 01/26/24  1548     Patient presents with: Symptomatic Hypertension   Courtney Clayton is a 53 y.o. female.  {Add pertinent medical, surgical, social history, OB history to HPI:32947} HPI     53 year old female with history of hypertension, hyperlipidemia, diabetes comes in with chief complaint of headache, severely high blood pressure, nausea, dizziness.  Patient states that over the last few days she has noted headaches, dizziness.  Today she went to the internal medicine clinic, they advised that she come to the ER.  Patient's BP was over 200.  She has not been compliant with her medication because of financial reasons.  Currently patient is having headaches.  Headaches are described as sharp, throbbing and currently she has sensitivity to light.  That was not the case over the last few days.  She has had migraine headaches in the past.  Patient's review of system is also positive for dizziness, described as feeling lightheaded.  Upon ambulation, she will often feel unsteady and needs assistance.  She has had headache, neck pain as well.  Patient denies any associated double vision, difficulty in swallowing, slurred speech  Prior to Admission medications   Medication Sig Start Date End Date Taking? Authorizing Provider  albuterol  (PROVENTIL ) (2.5 MG/3ML) 0.083% nebulizer solution Take 3 mLs (2.5 mg total) by nebulization every 2 (two) hours as needed for wheezing. 10/04/21   Samtani, Jai-Gurmukh, MD  allopurinol  (ZYLOPRIM ) 100 MG tablet Take 1 tablet (100 mg total) by mouth daily. 07/29/23 10/27/23  Nooruddin, Saad, MD  amLODipine  (NORVASC ) 10 MG tablet Take 1 tablet (10 mg total) by mouth daily. 06/21/23   Atway, Rayann N, DO  amLODipine  (NORVASC ) 10 MG tablet Take 1 tablet (10 mg total) by mouth daily. 01/21/24   Dasie Faden, MD  Blood Glucose Monitoring Suppl (BLOOD GLUCOSE  MONITOR SYSTEM) w/Device KIT Use to test blood sugar in the morning, at noon, and at bedtime. 06/24/22   Rizwan, Saima, MD  cetirizine  (ZYRTEC  ALLERGY) 10 MG tablet Take 1 tablet (10 mg total) by mouth at bedtime as needed  for allergies or rhinitis. 07/29/23   Nooruddin, Saad, MD  citalopram  (CELEXA ) 20 MG tablet Take 1 tablet (20 mg total) by mouth daily. 06/21/23   Atway, Rayann N, DO  diclofenac  Sodium (VOLTAREN  ARTHRITIS PAIN) 1 % GEL Apply 2 g topically 4 (four) times daily. 07/19/23   Nooruddin, Saad, MD  diclofenac  Sodium (VOLTAREN ) 1 % GEL Apply 2 g topically to the affected area 4 (four) times daily. 07/19/23     empagliflozin  (JARDIANCE ) 10 MG TABS tablet Take 1 tablet (10 mg total) by mouth daily before breakfast. 01/21/24   Dasie Faden, MD  hydrochlorothiazide  (HYDRODIURIL ) 25 MG tablet Take 1 tablet (25 mg total) by mouth daily. 01/21/24   Dasie Faden, MD  ipratropium-albuterol  (DUONEB) 0.5-2.5 (3) MG/3ML SOLN Take 3 mLs by nebulization every 4 (four) hours as needed. Patient taking differently: Take 3 mLs by nebulization every 4 (four) hours as needed (sob/wheezing). 04/29/22   Alva Larraine FALCON, PA-C  liraglutide  (VICTOZA ) 18 MG/3ML SOPN Inject 0.6 mg into the skin daily. 04/29/23   Addie Perkins, DO  losartan  (COZAAR ) 100 MG tablet Take 1 tablet (100 mg total) by mouth daily. 10/20/23   Armenta Canning, MD  losartan  (COZAAR ) 100 MG tablet Take 1 tablet (100 mg total) by mouth daily. 01/21/24   Dasie Faden, MD  Metoprolol   Tartrate 75 MG TABS Take 1 tablet (75 mg total) by mouth 2 (two) times daily. 06/21/23   Atway, Rayann N, DO  Peak Flow Meter DEVI 1 Device by Does not apply route as needed. 10/04/21   Samtani, Jai-Gurmukh, MD  simvastatin  (ZOCOR ) 40 MG tablet Take 1 tablet (40 mg total) by mouth every evening. 05/09/23 05/08/24  Addie Perkins, DO  simvastatin  (ZOCOR ) 40 MG tablet Take 1 tablet (40 mg total) by mouth every evening. 05/09/23     spironolactone  (ALDACTONE ) 25 MG tablet Take  1 tablet (25 mg total) by mouth daily. 01/26/24   Kandis Perkins, DO  zolpidem  (AMBIEN  CR) 12.5 MG CR tablet Take 1 tablet by mouth at bedtime as needed for sleep. 06/24/22   Rizwan, Saima, MD    Allergies: Sulfa  antibiotics    Review of Systems  All other systems reviewed and are negative.   Updated Vital Signs BP (!) 180/104   Pulse 94   Temp 98.8 F (37.1 C) (Oral)   Resp (!) 25   SpO2 100%   Physical Exam Vitals and nursing note reviewed.  Constitutional:      Appearance: She is well-developed.  HENT:     Head: Atraumatic.  Eyes:     Extraocular Movements: Extraocular movements intact.     Pupils: Pupils are equal, round, and reactive to light.  Cardiovascular:     Rate and Rhythm: Normal rate.  Pulmonary:     Effort: Pulmonary effort is normal.  Musculoskeletal:     Cervical back: Normal range of motion and neck supple.  Skin:    General: Skin is warm and dry.  Neurological:     Mental Status: She is alert and oriented to person, place, and time.     Cranial Nerves: No cranial nerve deficit.     Sensory: No sensory deficit.     Motor: No weakness.     Coordination: Coordination normal.     Gait: Gait abnormal.     (all labs ordered are listed, but only abnormal results are displayed) Labs Reviewed  BASIC METABOLIC PANEL WITH GFR - Abnormal; Notable for the following components:      Result Value   CO2 21 (*)    Glucose, Bld 237 (*)    All other components within normal limits  CBC - Abnormal; Notable for the following components:   RBC 5.25 (*)    All other components within normal limits  I-STAT CHEM 8, ED - Abnormal; Notable for the following components:   Glucose, Bld 251 (*)    Calcium , Ion 1.01 (*)    All other components within normal limits  BRAIN NATRIURETIC PEPTIDE  TROPONIN I (HIGH SENSITIVITY)  TROPONIN I (HIGH SENSITIVITY)    EKG: None  Date: 01/26/2024  Rate: 100  Rhythm: normal sinus rhythm, left axis deviation  QRS Axis: normal   Intervals: normal  ST/T Wave abnormalities: normal  Conduction Disutrbances: none  Narrative Interpretation: unremarkable   Radiology: CT ANGIO HEAD NECK W WO CM Result Date: 01/26/2024 CLINICAL DATA:  Carotid artery aneurysm suspected EXAM: CT ANGIOGRAPHY HEAD AND NECK WITH AND WITHOUT CONTRAST TECHNIQUE: Multidetector CT imaging of the head and neck was performed using the standard protocol during bolus administration of intravenous contrast. Multiplanar CT image reconstructions and MIPs were obtained to evaluate the vascular anatomy. Carotid stenosis measurements (when applicable) are obtained utilizing NASCET criteria, using the distal internal carotid diameter as the denominator. RADIATION DOSE REDUCTION: This exam was performed according to the  departmental dose-optimization program which includes automated exposure control, adjustment of the mA and/or kV according to patient size and/or use of iterative reconstruction technique. CONTRAST:  75mL OMNIPAQUE  IOHEXOL  350 MG/ML SOLN COMPARISON:  CT head 01/21/2024 FINDINGS: CT HEAD FINDINGS Brain: No evidence of acute infarction, hemorrhage, hydrocephalus, extra-axial collection or mass lesion/mass effect. Vascular: See below. Skull: No acute fracture. Sinuses/Orbits: Clear sinuses.  No acute orbital findings. Other: No mastoid effusions. Review of the MIP images confirms the above findings CTA NECK FINDINGS Aortic arch: Great vessel origins are patent without significant stenosis. Right carotid system: No evidence of dissection, stenosis (50% or greater), or occlusion. Left carotid system: No evidence of dissection, stenosis (50% or greater), or occlusion. Vertebral arteries: Left dominant. No evidence of dissection, stenosis (50% or greater), or occlusion. Skeleton: No acute abnormality on limited assessment. Bridging anterior osteophytes. Other neck: No acute abnormality on limited assessment. Upper chest: Lung apices are clear. Review of the MIP images  confirms the above findings CTA HEAD FINDINGS Anterior circulation: Bilateral intracranial ICAs, MCAs, and ACAs are patent without proximal hemodynamically significant stenosis. No aneurysm identified. Posterior circulation: Bilateral intradural vertebral arteries, basilar artery and bilateral posterior cerebral arteries are patent without proximal hemodynamically significant stenosis. No aneurysm identified. Venous sinuses: As permitted by contrast timing, patent. Review of the MIP images confirms the above findings IMPRESSION: 1. No evidence of acute intracranial abnormality. 2. No large vessel occlusion or proximal hemodynamically significant stenosis. 3. No aneurysm identified. Electronically Signed   By: Gilmore GORMAN Molt M.D.   On: 01/26/2024 18:55   DG Chest 2 View Result Date: 01/26/2024 CLINICAL DATA:  Chest pain. EXAM: CHEST - 2 VIEW COMPARISON:  January 21, 2024. FINDINGS: The heart size and mediastinal contours are within normal limits. Both lungs are clear. The visualized skeletal structures are unremarkable. IMPRESSION: No active cardiopulmonary disease. Electronically Signed   By: Lynwood Landy Raddle M.D.   On: 01/26/2024 18:03    {Document cardiac monitor, telemetry assessment procedure when appropriate:32947} Procedures   Medications Ordered in the ED  iohexol  (OMNIPAQUE ) 350 MG/ML injection 75 mL (75 mLs Intravenous Contrast Given 01/26/24 1832)  prochlorperazine  (COMPAZINE ) injection 10 mg (10 mg Intravenous Given 01/26/24 2330)  diphenhydrAMINE  (BENADRYL ) injection 25 mg (25 mg Intravenous Given 01/26/24 2332)      {Click here for ABCD2, HEART and other calculators REFRESH Note before signing:1}                              Medical Decision Making Amount and/or Complexity of Data Reviewed Labs: ordered. Radiology: ordered.  Risk Prescription drug management.   53 year old patient comes in with chief complaint of headache, hypertension, chest pain, shortness of breath,  dizziness.  I reviewed patient's records including PCP notes  Patient admits to noncompliance with medication, because of financial reasons.   Final diagnoses:  Hypertensive emergency    ED Discharge Orders     None

## 2024-01-26 NOTE — Patient Instructions (Signed)
 Thank you, Ms.[name] for allowing us  to provide your care today. Today we discussed high blood pressure.    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 The treatment is spironolactone . I have sent this to the pharmacy.  If your blood pressure is less than 100/60, please discontinue amlodipine  and give us  a call!    I have ordered the following medication/changed the following medications:   Stop the following medications: Medications Discontinued During This Encounter  Medication Reason   spironolactone  (ALDACTONE ) 25 MG tablet Reorder     Start the following medications: Meds ordered this encounter  Medications   spironolactone  (ALDACTONE ) 25 MG tablet    Sig: Take 1 tablet (25 mg total) by mouth daily.    Dispense:  90 tablet    Refill:  3    IM program per secure chat with provider 07/27/23 ALB     Follow up: 2 weeks with provider for blood pressure follow up   Should you have any questions or concerns please call the internal medicine clinic at [phone].     Please note that our late policy has changed.  If you are more than 15 minutes late to your appointment, you may be asked to reschedule your appointment.  Dr. Kandis, D.O. [facility]

## 2024-01-26 NOTE — H&P (Addendum)
 Date: 01/27/2024               Patient Name:  Courtney Clayton MRN: [id number]  DOB: 01-Mar-1971 Age / Sex: 53 y.o., female   PCP: Nelia Dirks, MD         Medical Service: Internal Medicine Teaching Service         Attending Physician: Dr. Reyes Fenton      First Contact: Remonia Romano, DO}    Second Contact: Dr. Ozell Kung, MD          Pager Information: First Contact Pager: [phone]   Second Contact Pager: [phone]   SUBJECTIVE   Chief Complaint: Uncontrolled hypertension, severe headache  History of Present Illness: Courtney Clayton is a 54 y.o. 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  Presented to ED from clinic with blood pressures 209/111 and 202/108 + severe headache and blurred vision.  She stated at home her BP has been running 180s/100s.  Previous ED visit 01/21/2024 for hypertensive urgency after running out of her hypertension medications due to financial cost.  She has been out of her medications for several weeks but after her hospitalization was able to go home with her prescribed amlodipine  10 mg, losartan  100 mg, and hydrochlorothiazide  25 mg (verified pick up with dispense history).  Her PCP had prescribed her spironolactone  25 mg 07/28/2023 for HTN 2/2 primary hyperaldosteronism, but she has been unable to procure this medication due to cost.  Since being back on her blood pressure medications she notes some improvement to her headache, but overall still feeling poorly.  She had previously been prescribed an abortive migraine medication but has also been unable to pay for this so has not used for several months.  Endorses similar headache to her past  migraines ongoing for the past 2 weeks. Currently 4.5/10 (has gone as bad as 9/10) across her forehead and on posterior head/neck bilaterally (similar as noted in ED visit note 2024).  Comes and goes, throbbing in nature.  Associated with photophobia and phonophobia.  Associated with aura prior to headache, generally visual. + Nausea.  Unrelieved with Tylenol .  Associated lightheaded and dizziness and unsteadiness on feet while ambulating the past several days.  Endorsed: Previous chest pain and shortness of breath with exertion within the past 2 weeks (no current CP or SOB) Denies: Eye pain, vision loss, vomiting (last vomited Saturday), slurred speech, weakness, focal deficit, urinary symptoms, abdominal pain, fevers, chills, NSAID use.  ED Course: Labs significant for troponin unremarkable, CBC unremarkable, BMP unremarkable Imaging:  -CT angio head and neck with and without contrast: No evidence of acute intracranial abnormality, no large vessel occlusion or proximal hemodynamically significant stenosis, no aneurysm identified.   -MRI brain without contrast: No evidence of acute intracranial abnormality. Received diphenhydramine  25 mg and Compazine  10 mg  Meds:  Patient reported:  -albuterol  PRN -allopurinol  (not taking) -amlodipine  10 mg (recently restarted on Sat) -Celexa  20 mg (not taking) -Jardiance  10 mg (not taking) -hydrochlorothiazide  25 mg (recently restarted on Sat) -Victoza  0.6 mg (not taking) -Losartan  100 mg (recently restarted on Sat) -Metoprolol  75 mg BID (not taking) -Simvastatin  40 mg  (not taking) -Spironolactone  25 mg (new, has not started) -Ambien  12.5 mg at bedtime PRN (not taking) Current Meds  Medication  Sig   acetaminophen  (TYLENOL ) 500 MG tablet Take 1,000 mg by mouth every 6 (six) hours as needed for mild pain (pain score 1-3), headache or fever.   amLODipine  (NORVASC ) 10 MG tablet Take 1 tablet (10 mg total) by mouth daily.   hydrochlorothiazide   (HYDRODIURIL ) 25 MG tablet Take 1 tablet (25 mg total) by mouth daily.   losartan  (COZAAR ) 100 MG tablet Take 1 tablet (100 mg total) by mouth daily.   Multiple Vitamins-Minerals (ONE A DAY WOMEN 50 PLUS) TABS Take 1 tablet by mouth in the morning.    Past Medical History  Past Medical History:  Diagnosis Date   Asthma    CHF (congestive heart failure) (HCC)    Depression    Diabetes mellitus without complication (HCC)    Hypertension    Hypertensive urgency 10/03/2021   Hypertensive urgency 10/03/2021   MI, old    Vaginal itching 12/21/2022   Past Surgical History Past Surgical History:  Procedure Laterality Date   ABDOMINAL HYSTERECTOMY     BACK SURGERY     GANGLION CYST EXCISION     HERNIA REPAIR     TONSILLECTOMY     Social:  Lives: alone but daughter visits daily   Occupation: yes currently Investment banker, corporate for [employer] Support: family  Level of Function: independent with ADLs PCP:  Nooruddin, Saad, MD  Substances: -Tobacco: denies -Alcohol: denies -Recreational Drug: denies   Family History:  History reviewed. No pertinent family history.   Allergies: Allergies as of 01/26/2024 - Review Complete 01/26/2024  Allergen Reaction Noted   Sulfa  antibiotics Hives 10/06/2017    Review of Systems: A complete ROS was negative except as per HPI.   OBJECTIVE:   Physical Exam: Blood pressure (!) 180/104, pulse 94, temperature 98.8 F (37.1 C), temperature source Oral, resp. rate (!) 25, SpO2 100%.  Physical Exam HENT:     Mouth/Throat:     Mouth: Mucous membranes are moist.  Eyes:     General: No visual field deficit.       Right eye: No discharge.        Left eye: No discharge.     Extraocular Movements: Extraocular movements intact.     Conjunctiva/sclera: Conjunctivae normal.     Pupils: Pupils are equal, round, and reactive to light.  Neck:     Vascular: No carotid bruit.  Cardiovascular:     Rate and Rhythm: Normal rate and regular rhythm.      Pulses:          Dorsalis pedis pulses are 2+ on the right side and 2+ on the left side.     Heart sounds: Normal heart sounds. No murmur heard.    No friction rub. No gallop.  Pulmonary:     Effort: Pulmonary effort is normal. No respiratory distress.     Breath sounds: Normal breath sounds. No stridor. No wheezing, rhonchi or rales.  Abdominal:     General: Abdomen is protuberant. Bowel sounds are normal.     Palpations: Abdomen is soft.     Tenderness: There is no abdominal tenderness. There is no guarding.  Musculoskeletal:        General: Deformity: 2+. Normal range of motion.     Right lower leg: 2+ Pitting Edema present.     Left lower leg: 2+ Pitting Edema present.  Skin:    General: Skin is warm and dry.  Neurological:     General: No focal deficit present.     Mental  Status: She is alert. Mental status is at baseline.     Cranial Nerves: No cranial nerve deficit, dysarthria or facial asymmetry.     Sensory: Sensory deficit (Chronic sensory  dullness left lateral upper extremity and lower extremity) present.     Motor: No weakness, tremor or abnormal muscle tone.     Gait: Gait (Watch patient ambulate to wheelchair from bed.) normal.     Comments: Strength intact bilateral upper and lower extremity      Labs: CBC    Component Value Date/Time   WBC 7.9 01/26/2024 1725   RBC 5.25 (H) 01/26/2024 1725   HGB 14.6 01/26/2024 1734   HCT 43.0 01/26/2024 1734   PLT 291 01/26/2024 1725   MCV 82.9 01/26/2024 1725   MCH 26.1 01/26/2024 1725   MCHC 31.5 01/26/2024 1725   RDW 14.5 01/26/2024 1725   LYMPHSABS 4.3 (H) 01/21/2024 0857   MONOABS 0.8 01/21/2024 0857   EOSABS 0.1 01/21/2024 0857   BASOSABS 0.0 01/21/2024 0857     CMP     Component Value Date/Time   NA 136 01/26/2024 1734   NA 141 07/19/2023 0927   K 3.8 01/26/2024 1734   CL 103 01/26/2024 1734   CO2 21 (L) 01/26/2024 1725   GLUCOSE 251 (H) 01/26/2024 1734   BUN 11 01/26/2024 1734   BUN 13 07/19/2023  0927   CREATININE 0.80 01/26/2024 1734   CALCIUM  8.9 01/26/2024 1725   PROT 6.9 01/21/2024 0857   ALBUMIN 3.8 01/21/2024 0857   AST 27 01/21/2024 0857   ALT 18 01/21/2024 0857   ALKPHOS 90 01/21/2024 0857   BILITOT 0.5 01/21/2024 0857   GFRNONAA >60 01/26/2024 1725   GFRAA >60 12/04/2019 2351    Imaging:  MR BRAIN WO CONTRAST Result Date: 01/27/2024 CLINICAL DATA:  Neuro deficit, acute, stroke suspected EXAM: MRI HEAD WITHOUT CONTRAST TECHNIQUE: Multiplanar, multiecho pulse sequences of the brain and surrounding structures were obtained without intravenous contrast. COMPARISON:  CTA head/neck 01/26/2024. FINDINGS: Brain: No acute infarction, hemorrhage, hydrocephalus, extra-axial collection or mass lesion. Partially empty sella. Vascular: Normal flow voids. Skull and upper cervical spine: Normal marrow signal. Sinuses/Orbits: Negative. Other: No sizable mastoid effusions. IMPRESSION: No evidence of acute intracranial abnormality. Electronically Signed   By: Gilmore GORMAN Molt M.D.   On: 01/27/2024 01:38   CT ANGIO HEAD NECK W WO CM Result Date: 01/26/2024 CLINICAL DATA:  Carotid artery aneurysm suspected EXAM: CT ANGIOGRAPHY HEAD AND NECK WITH AND WITHOUT CONTRAST TECHNIQUE: Multidetector CT imaging of the head and neck was performed using the standard protocol during bolus administration of intravenous contrast. Multiplanar CT image reconstructions and MIPs were obtained to evaluate the vascular anatomy. Carotid stenosis measurements (when applicable) are obtained utilizing NASCET criteria, using the distal internal carotid diameter as the denominator. RADIATION DOSE REDUCTION: This exam was performed according to the departmental dose-optimization program which includes automated exposure control, adjustment of the mA and/or kV according to patient size and/or use of iterative reconstruction technique. CONTRAST:  75mL OMNIPAQUE  IOHEXOL  350 MG/ML SOLN COMPARISON:  CT head 01/21/2024 FINDINGS: CT  HEAD FINDINGS Brain: No evidence of acute infarction, hemorrhage, hydrocephalus, extra-axial collection or mass lesion/mass effect. Vascular: See below. Skull: No acute fracture. Sinuses/Orbits: Clear sinuses.  No acute orbital findings. Other: No mastoid effusions. Review of the MIP images confirms the above findings CTA NECK FINDINGS Aortic arch: Great vessel origins are patent without significant stenosis. Right carotid system: No evidence of dissection, stenosis (50% or greater), or occlusion. Left  carotid system: No evidence of dissection, stenosis (50% or greater), or occlusion. Vertebral arteries: Left dominant. No evidence of dissection, stenosis (50% or greater), or occlusion. Skeleton: No acute abnormality on limited assessment. Bridging anterior osteophytes. Other neck: No acute abnormality on limited assessment. Upper chest: Lung apices are clear. Review of the MIP images confirms the above findings CTA HEAD FINDINGS Anterior circulation: Bilateral intracranial ICAs, MCAs, and ACAs are patent without proximal hemodynamically significant stenosis. No aneurysm identified. Posterior circulation: Bilateral intradural vertebral arteries, basilar artery and bilateral posterior cerebral arteries are patent without proximal hemodynamically significant stenosis. No aneurysm identified. Venous sinuses: As permitted by contrast timing, patent. Review of the MIP images confirms the above findings IMPRESSION: 1. No evidence of acute intracranial abnormality. 2. No large vessel occlusion or proximal hemodynamically significant stenosis. 3. No aneurysm identified. Electronically Signed   By: Gilmore GORMAN Molt M.D.   On: 01/26/2024 18:55   DG Chest 2 View Result Date: 01/26/2024 CLINICAL DATA:  Chest pain. EXAM: CHEST - 2 VIEW COMPARISON:  January 21, 2024. FINDINGS: The heart size and mediastinal contours are within normal limits. Both lungs are clear. The visualized skeletal structures are unremarkable.  IMPRESSION: No active cardiopulmonary disease. Electronically Signed   By: Lynwood Landy Raddle M.D.   On: 01/26/2024 18:03     EKG: personally reviewed my interpretation is normal sinus rhythm with left axis deviation. Prior EKG 01/21/2024 similar.  ASSESSMENT & PLAN:   Assessment & Plan by Problem: Principal Problem:   Severe hypertension Active Problems:   Morbid obesity with BMI of 50.0-59.9, adult (HCC)   Hypertension 2/2 primary hyperaldosteronism   Hyperlipidemia associated with type 2 diabetes mellitus (HCC)   T2DM (type 2 diabetes mellitus) (HCC)   Migraine with aura   Courtney Clayton is a 53 y.o. 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 Aldosterone/renin ratio >31.1 on 07/19/2023. Has been out of medications for several weeks until hospitalization 01/21/2024 for hypertensive urgency. PCP prescribed amlodipine  10 mg, losartan  100 mg, and hydrochlorothiazide  25 mg and spironolactone  25 mg for HTN 2/2 primary hyperaldosteronism, but has been unable to take these reliably or pick up spironolactone  due to cost barrier and being uninsured.  Left hospitalization with amlodipine , losartan , hydrochlorothiazide  and has been taking these medications since.  Blood pressure on exam 152/92.  Had not received any blood pressure medication in ED yet; stated she took her 3 BP medications that  morning.  No signs of end-organ damage from BMP, troponin and EKG and low suspicion of stroke with unremarkable CT and MRI. Patient stated that she will now have insurance with new job, if not clinic outpatient can set up patient medication assistance. - Start spironolactone  25 mg daily - Continue amlodipine  10 mg, losartan  100 mg, hydrochlorothiazide  25 mg in a.m. - Monitor blood pressure, aim to reduce by 25% in first 2-4 hours; by 24 hours reduce to <160. - Follow-up outpatient with insurance status, if not set up appointment with Lavern at [place] [facility] for  assistance.  Migraine headache with aura Blurred vision Nausea Endorses similar headache to her past migraines with aura ongoing for the past several weeks.  Has been seen in ED previously for similar presentation of headache.  Blurred vision described more as from photophobia. The associated lightheaded,dizziness, and unsteadiness on feet while ambulating the past several days likely due to complex migraine as there is low suspicion for stroke with unremarkable CT and MRI.  When assessing ambulation from bed to chair patient stated she felt overall a bit better and more steady. - PRN Fioricet 50/325/40 mg ordered - Zofran  4 mg as needed every 8 hours ordered (QTc within normal limits at 454) - Consider outpatient Fioricet or sumatriptan with cost assistance or hopeful coverage with insurance  Chest pain and shortness of breath on exertion Asymptomatic on evaluation / exam.  States last week she had episodes of CP and SOB with exertion.  Troponin unremarkable, EKG sinus rhythm no ST depression, inverted T waves, ST elevation.  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  Patient noted that since her deficit is chronic and not associated with her new symptoms of headache or high blood pressure.  Type 2  diabetes Repeat A1c 9.5 01/27/2024 (up from 7.3 in march 2025) Blood glucose 237 on admit. Unable to afford Jardiance  due to cost barrier.  -Restarted Jardiance  10 mg - Sensitive sliding scale ordered - Follow-up outpatient with insurance status to restart Jardiance , if not set up appointment with [facility]vern at [facility] for assistance.  Hyperlipidemia Last lipid panel 04/29/2023 LDL 118 has not picked up simvastatin  since March 2025. - Defer repeat lipid panel until restarted on simvastatin .  Follow-up outpatient. - Restart simvastatin  40 milligrams  Best practice: Diet: Normal VTE: Xarelto  10 mg IVF: none,None Code: Full  Disposition planning: Prior to Admission Living Arrangement: Home, living with son Anticipated Discharge Location: Home  Dispo: Admit patient to Observation with expected length of stay less than 2 midnights.  Signed: Benuel Braun, DO Internal Medicine Resident  01/27/2024, 2:17 AM  On Call pager: [phone]

## 2024-01-26 NOTE — ED Triage Notes (Signed)
 Pt coming in complaining of chest pain shortness of breath pt is diaphoretic at quick triage. Pt is reporting history of hypertension.

## 2024-01-26 NOTE — ED Triage Notes (Signed)
 Patient sent by IM for HTN, symptomatic at this time with CP, SHOB and dizziness. Re-started on BP meds on 9-13, patient seen in office today and BP 209/111. Patient has been taking all BP meds except the empagliflozin .

## 2024-01-26 NOTE — ED Provider Triage Note (Signed)
 Emergency Medicine Provider Triage Evaluation Note  Courtney Clayton , a 53 y.o. female  was evaluated in triage.  Pt complains of hypertension, headache, hypertension, photophobia, chest pain  Review of Systems  Positive: Photophobia, headache, chest pain, shortness of breath Negative: Focal deficits, extremity weakness, syncope, dizziness,  Physical Exam  BP (!) 170/106 (BP Location: Right Arm)   Pulse (!) 108   Temp 99.6 F (37.6 C)   Resp 16   SpO2 99%  Gen:   Awake, no distress   Resp:  Normal effort clear to auscultation all fields MSK:   Moves extremities without difficulty.  Mild swelling in right ankle Other:  Photophobia, chest pain, no bruits noted on exam  Medical Decision Making  Medically screening exam initiated at 5:04 PM.  Appropriate orders placed.  Courtney Clayton Alert was informed that the remainder of the evaluation will be completed by another provider, this initial triage assessment does not replace that evaluation, and the importance of remaining in the ED until their evaluation is complete.  53 year old female presents to the ED with complaints of hypertension for several days.  Patient had reported  209/111 BP.  Patient reports headache, photophobia, chest pain, mild shortness of breath.  Lungs are clear to auscultation all fields and no increased work of breathing.  Patient appears in no acute distress.  Patient has pupils equal and reactive but reports severe headache.  Cardiac versus intracranial versus vascular etiology for rule out.   Courtney Clayton, [place] 01/26/24 1708

## 2024-01-26 NOTE — Progress Notes (Signed)
 Established Patient Office Visit  Subjective   Patient ID: Courtney Clayton, female    DOB: Jul 21, 1970  Age: 53 y.o. MRN: [id number]  Chief Complaint  Patient presents with   Transitions Of Care    Hfu     Courtney Clayton is a 53 y.o. who presents to the clinic for an ED follow up. Please see problem based assessment and plan for additional details.   209/111, 202/108   Patient was seen in the ED on 01/21/2024 for hypertension urgency after she ran out of her hypertension medications due to financial costs. Per patient she was discharged home with amlodipine  10 mg, losartan  100 mg, and hydrochlorothiazide  25 mg. She reports compliance since discharge and took her medications this morning. Today, she reports a headache and blurred vision. She denies chest pain and shortness of breath. Her home blood pressure readings have been in the 200s/100s.    Patient Active Problem List   Diagnosis Date Noted   Left arm pain 07/19/2023   Hypertension 2/2 primary hyperaldosteronism 07/13/2022   Hyperlipidemia associated with type 2 diabetes mellitus (HCC) 07/13/2022   Asthma 10/03/2021   Morbid obesity with BMI of 50.0-59.9, adult (HCC) 10/03/2021   Controlled type 2 diabetes mellitus without complication, without long-term current use of insulin  (HCC) 10/03/2021   Depression 10/03/2021     Objective:     BP (!) 202/108 (BP Location: Right Arm, Cuff Size: Large)   Pulse 97   Temp 98.6 F (37 C) (Oral)   Ht 5' 5 (1.651 m)   Wt (!) 322 lb 3.2 oz (146.1 kg)   SpO2 99%   BMI 53.62 kg/m  BP Readings from Last 3 Encounters:  01/26/24 (!) 170/106  01/26/24 (!) 202/108  01/21/24 (!) 175/101   Wt Readings from Last 3 Encounters:  01/26/24 (!) 322 lb 3.2 oz (146.1 kg)  01/21/24 (!) 325 lb (147.4 kg)  10/19/23 (!) 321 lb 14 oz (146 kg)      Physical Exam Vitals reviewed.  Constitutional:      General: She is not in acute distress.    Appearance: She is not ill-appearing,  toxic-appearing or diaphoretic.  Cardiovascular:     Rate and Rhythm: Normal rate and regular rhythm.     Heart sounds: No murmur heard. Pulmonary:     Effort: Pulmonary effort is normal. No respiratory distress.     Breath sounds: Normal breath sounds. No wheezing or rales.  Musculoskeletal:     Right lower leg: Edema present.     Left lower leg: Edema present.  Skin:    General: Skin is warm and dry.  Neurological:     Mental Status: She is alert.     Cranial Nerves: Cranial nerves 2-12 are intact. No dysarthria or facial asymmetry.     Motor: Motor function is intact. No weakness.     Last metabolic panel Lab Results  Component Value Date   GLUCOSE 251 (H) 01/26/2024   NA 136 01/26/2024   K 3.8 01/26/2024   CL 103 01/26/2024   CO2 21 (L) 01/26/2024   BUN 11 01/26/2024   CREATININE 0.80 01/26/2024   GFRNONAA >60 01/26/2024   CALCIUM  8.9 01/26/2024   PROT 6.9 01/21/2024   ALBUMIN 3.8 01/21/2024   BILITOT 0.5 01/21/2024   ALKPHOS 90 01/21/2024   AST 27 01/21/2024   ALT 18 01/21/2024   ANIONGAP 12 01/26/2024   Last lipids Lab Results  Component Value Date   CHOL 188 04/29/2023  HDL 59 04/29/2023   LDLCALC 118 (H) 04/29/2023   TRIG 61 04/29/2023   CHOLHDL 3.2 04/29/2023   Last hemoglobin A1c Lab Results  Component Value Date   HGBA1C 7.3 (A) 07/19/2023      The ASCVD Risk score (Arnett DK, et al., 2019) failed to calculate for the following reasons:   Risk score cannot be calculated because patient has a medical history suggesting prior/existing ASCVD    Assessment & Plan:   Problem List Items Addressed This Visit       Cardiovascular and Mediastinum   Hypertension 2/2 primary hyperaldosteronism   Patient was seen in the ED on 01/21/2024 for hypertension urgency after she ran out of her hypertension medications due to financial costs. Per patient she was discharged home with amlodipine  10 mg, losartan  100 mg, and hydrochlorothiazide  25 mg. She reports  compliance since discharge and took her medications this morning. Today, she reports a headache and blurred vision. She denies chest pain and shortness of breath. Her home blood pressure readings have been in the 200s/100s.   Her blood pressure today is 209/111 rechecked at 202/108. During chart review, it was noted that she has a diagnosis of primary hyperaldosteronism and was unable to afford spironolactone .   Since was sent to the ED for hypertensive urgency for evaluation of hypertensive emergency.   Plan: -START sprinolactone 25 mg  -Continue  amlodipine  10 mg, losartan  100 mg, and hydrochlorothiazide  25 mg, patient was provided instructions of what to do if BP becomes less than 100/60  -Patient was informed of her emergent condition and was instructed to be evaluated in the emergency department. She was aware that it is safer for EMS transportation but preferred to walk across the street to the ED instead. -2 week follow up, will need BMP then      Relevant Medications   spironolactone  (ALDACTONE ) 25 MG tablet   Other Visit Diagnoses       Encounter for immunization    -  Primary   Relevant Orders   Flu vaccine trivalent PF, 6mos and older(Flulaval,Afluria,Fluarix,Fluzone) (Completed)       Return in about 2 weeks (around 02/09/2024) for HTN, needs BMP.    Damien Lease, DO

## 2024-01-27 ENCOUNTER — Other Ambulatory Visit (HOSPITAL_COMMUNITY): Payer: Self-pay

## 2024-01-27 ENCOUNTER — Telehealth (HOSPITAL_COMMUNITY): Payer: Self-pay | Admitting: Pharmacy Technician

## 2024-01-27 ENCOUNTER — Emergency Department (HOSPITAL_COMMUNITY)

## 2024-01-27 ENCOUNTER — Other Ambulatory Visit: Payer: Self-pay

## 2024-01-27 DIAGNOSIS — I152 Hypertension secondary to endocrine disorders: Secondary | ICD-10-CM

## 2024-01-27 DIAGNOSIS — G43109 Migraine with aura, not intractable, without status migrainosus: Secondary | ICD-10-CM | POA: Insufficient documentation

## 2024-01-27 DIAGNOSIS — Z6841 Body Mass Index (BMI) 40.0 and over, adult: Secondary | ICD-10-CM | POA: Diagnosis not present

## 2024-01-27 DIAGNOSIS — E269 Hyperaldosteronism, unspecified: Secondary | ICD-10-CM | POA: Diagnosis not present

## 2024-01-27 DIAGNOSIS — I161 Hypertensive emergency: Principal | ICD-10-CM

## 2024-01-27 DIAGNOSIS — E119 Type 2 diabetes mellitus without complications: Secondary | ICD-10-CM

## 2024-01-27 DIAGNOSIS — E785 Hyperlipidemia, unspecified: Secondary | ICD-10-CM

## 2024-01-27 DIAGNOSIS — I10 Essential (primary) hypertension: Secondary | ICD-10-CM | POA: Diagnosis present

## 2024-01-27 DIAGNOSIS — Z79899 Other long term (current) drug therapy: Secondary | ICD-10-CM

## 2024-01-27 DIAGNOSIS — Z91141 Patient's other noncompliance with medication regimen due to financial hardship: Secondary | ICD-10-CM

## 2024-01-27 DIAGNOSIS — G43E09 Chronic migraine with aura, not intractable, without status migrainosus: Secondary | ICD-10-CM

## 2024-01-27 LAB — BASIC METABOLIC PANEL WITH GFR
Anion gap: 15 (ref 5–15)
BUN: 10 mg/dL (ref 6–20)
CO2: 24 mmol/L (ref 22–32)
Calcium: 8.8 mg/dL — ABNORMAL LOW (ref 8.9–10.3)
Chloride: 99 mmol/L (ref 98–111)
Creatinine, Ser: 0.78 mg/dL (ref 0.44–1.00)
GFR, Estimated: >60 mL/min (ref >60)
Glucose, Bld: 279 mg/dL — ABNORMAL HIGH (ref 70–99)
Potassium: 3.5 mmol/L (ref 3.5–5.1)
Sodium: 138 mmol/L (ref 135–145)

## 2024-01-27 LAB — HIV ANTIBODY (ROUTINE TESTING W REFLEX): HIV Screen 4th Generation wRfx: NONREACTIVE

## 2024-01-27 LAB — HEMOGLOBIN A1C
Hgb A1c MFr Bld: 9.5 % — ABNORMAL HIGH (ref 4.8–5.6)
Mean Plasma Glucose: 225.95 mg/dL

## 2024-01-27 LAB — BRAIN NATRIURETIC PEPTIDE: B Natriuretic Peptide: 3.2 pg/mL (ref 0.0–100.0)

## 2024-01-27 MED ORDER — SENNOSIDES-DOCUSATE SODIUM 8.6-50 MG PO TABS: 1.0000 | ORAL_TABLET | Freq: Every evening | ORAL | Status: DC | PRN

## 2024-01-27 MED ORDER — PROPRANOLOL HCL ER 80 MG PO CP24
80.0000 mg | ORAL_CAPSULE | Freq: Every day | ORAL | 11 refills | Status: AC
  Filled 2024-01-27 – 2024-02-27 (×3): qty 30, 30d supply, fill #0
  Filled 2024-03-21 – 2024-03-22 (×2): qty 30, 30d supply, fill #1
  Filled 2024-04-20: qty 30, 30d supply, fill #2
  Filled 2024-05-21: qty 30, 30d supply, fill #3

## 2024-01-27 MED ORDER — AMLODIPINE BESYLATE 10 MG PO TABS
10.0000 mg | ORAL_TABLET | Freq: Every day | ORAL | Status: DC
  Administered 2024-01-27: 10 mg via ORAL
  Filled 2024-01-27: qty 1

## 2024-01-27 MED ORDER — EMPAGLIFLOZIN 10 MG PO TABS
10.0000 mg | ORAL_TABLET | Freq: Every day | ORAL | Status: DC
  Administered 2024-01-27: 10 mg via ORAL
  Filled 2024-01-27: qty 1

## 2024-01-27 MED ORDER — HYDROCHLOROTHIAZIDE 25 MG PO TABS
25.0000 mg | ORAL_TABLET | Freq: Every day | ORAL | Status: DC
  Administered 2024-01-27: 25 mg via ORAL
  Filled 2024-01-27: qty 1

## 2024-01-27 MED ORDER — SPIRONOLACTONE 25 MG PO TABS
25.0000 mg | ORAL_TABLET | Freq: Every day | ORAL | Status: DC
Start: 2024-01-27 — End: 2024-01-27

## 2024-01-27 MED ORDER — SIMVASTATIN 40 MG PO TABS
40.0000 mg | ORAL_TABLET | Freq: Every day | ORAL | 0 refills | Status: DC
  Filled 2024-01-27: qty 30, 30d supply, fill #0

## 2024-01-27 MED ORDER — LORAZEPAM 2 MG/ML IJ SOLN: 1.0000 mg | Freq: Once | INTRAMUSCULAR | Status: DC | PRN

## 2024-01-27 MED ORDER — RIVAROXABAN 10 MG PO TABS
10.0000 mg | ORAL_TABLET | Freq: Every day | ORAL | Status: DC
  Administered 2024-01-27: 10 mg via ORAL
  Filled 2024-01-27: qty 1

## 2024-01-27 MED ORDER — ONDANSETRON HCL 4 MG PO TABS: 4.0000 mg | ORAL_TABLET | Freq: Three times a day (TID) | ORAL | Status: DC | PRN

## 2024-01-27 MED ORDER — EMPAGLIFLOZIN 10 MG PO TABS
10.0000 mg | ORAL_TABLET | Freq: Every day | ORAL | 0 refills | Status: DC
  Filled 2024-01-27: qty 30, 30d supply, fill #0

## 2024-01-27 MED ORDER — INSULIN ASPART 100 UNIT/ML IJ SOLN
0.0000 [IU] | Freq: Three times a day (TID) | INTRAMUSCULAR | Status: DC
  Administered 2024-01-27: 1 [IU] via SUBCUTANEOUS

## 2024-01-27 MED ORDER — BUTALBITAL-APAP-CAFFEINE 50-325-40 MG PO TABS: 1.0000 | ORAL_TABLET | Freq: Once | ORAL | Status: DC | PRN

## 2024-01-27 MED ORDER — SIMVASTATIN 20 MG PO TABS: 40.0000 mg | ORAL_TABLET | Freq: Every day | ORAL | Status: DC

## 2024-01-27 MED ORDER — ACETAMINOPHEN 500 MG PO TABS: 1000.0000 mg | ORAL_TABLET | Freq: Four times a day (QID) | ORAL | Status: DC | PRN

## 2024-01-27 MED ORDER — LOSARTAN POTASSIUM 50 MG PO TABS
100.0000 mg | ORAL_TABLET | Freq: Every day | ORAL | Status: DC
  Administered 2024-01-27: 100 mg via ORAL
  Filled 2024-01-27: qty 2

## 2024-01-27 MED ORDER — SPIRONOLACTONE 25 MG PO TABS
25.0000 mg | ORAL_TABLET | Freq: Every day | ORAL | Status: DC
  Administered 2024-01-27: 25 mg via ORAL
  Filled 2024-01-27: qty 1

## 2024-01-27 MED ORDER — SPIRONOLACTONE 25 MG PO TABS
25.0000 mg | ORAL_TABLET | Freq: Every day | ORAL | 0 refills | Status: DC
  Filled 2024-01-27: qty 30, 30d supply, fill #0

## 2024-01-27 MED ORDER — ACETAMINOPHEN 650 MG RE SUPP: 650.0000 mg | Freq: Four times a day (QID) | RECTAL | Status: DC | PRN

## 2024-01-27 NOTE — Telephone Encounter (Signed)
 Pharmacy Patient Advocate Encounter  Received notification from [employer] that Prior Authorization for Ozempic (0.25 or 0.5 MG/DOSE) 2MG /3ML pen-injectors  has been APPROVED from 01/27/2024 to 01/26/2025. Ran test claim, Copay is $24.99. This test claim was processed through [facility]- copay amounts may vary at other pharmacies due to pharmacy/plan contracts, or as the patient moves through the different stages of their insurance plan.   PA #/Case ID/Reference #: [id number]

## 2024-01-27 NOTE — Plan of Care (Signed)
  Problem: Clinical Measurements: Goal: Ability to maintain clinical measurements within normal limits will improve Outcome: Progressing   Problem: Education: Goal: Knowledge of General Education information will improve Description: Including pain rating scale, medication(s)/side effects and non-pharmacologic comfort measures Outcome: Progressing   

## 2024-01-27 NOTE — Discharge Summary (Signed)
 Name: Courtney Clayton MRN: [id number] DOB: Jul 19, 1970 53 y.o. PCP: Nooruddin, Saad, MD  Date of Admission: 01/26/2024  4:01 PM Date of Discharge: 01/27/2024  2:15 PM Attending Physician: Dr. Reyes Fenton  Discharge Diagnosis: 1. 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   amLODipine  10 MG tablet Commonly known as: NORVASC  Take 1 tablet (10 mg total) by mouth daily.   hydrochlorothiazide  25 MG tablet Commonly known as: HYDRODIURIL  Take 1 tablet (25 mg total) by mouth daily.   Jardiance  10 MG Tabs tablet Generic drug: empagliflozin  Take 1 tablet (10 mg total) by mouth daily before breakfast. Start taking on: January 28, 2024   losartan  100 MG tablet Commonly known as: COZAAR  Take 1 tablet (100 mg total) by mouth daily.   One A Day Women 50 Plus Tabs Take 1 tablet by mouth in the morning.   propranolol  ER 80 MG 24 hr capsule Commonly known as: Inderal  LA Take 1 capsule (80 mg total) by mouth daily.   simvastatin  40 MG tablet Commonly known as: ZOCOR  Take 1 tablet (40 mg total) by mouth daily at 6 PM.   spironolactone  25 MG tablet Commonly known as: Aldactone  Take 1 tablet (25 mg total) by mouth daily. Start taking on: January 28, 2024        Disposition and  follow-up:   [name] was discharged from [facility] in Stable condition.  At the hospital follow up visit please address:  1.  Hypertension-patient was not able to get her spironolactone .  Her hypertension is secondary to hyperaldosteronism.  Once patient was given her spironolactone  in the hospital her blood pressure normalized.  Send patient home on a regimen of spironolactone , losartan , hydrochlorothiazide .  Will monitor blood pressures and outpatient  Migraines-patient has history of migraines.  Did not want to start sumatriptan hand due to risk for hypertension.  Did give patient propranolol  80 mg for migraine prophylaxis.  Will assess the symptoms better in clinic and see if any other medications are affordable or indicated  Type 2 diabetes mellitus-A1c is 9.5.  Patient is currently not taking anything for her diabetes.  She did try to get prior Auth on Ozempic and Jardiance .  Was approved and Ozempic co-pay was found to be 24.99 and Jardiance  is $45.  Will assess in clinic if patient is able to afford these or get better regimen for patient  2.  Labs / imaging needed at time of follow-up: BMP for creatinine  3.  Pending labs/ test needing follow-up: None  Follow-up Appointments: Internal medicine center on 02/09/2024 at 3:30 [facility] Course by problem list: Courtney Clayton is a 53 y.o. 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  She does have an aldosterone/renin ratio of greater than 31.1 that was found back in March.  He had her medications refilled on 01/21/2024 but she was in the emergency room for hypertensive urgency she is supposed to be on amlodipine  10, losartan  100 and hydrocortisone  25 and spironolactone  25.  She had been unable to pick up spironolactone  due to cost being uninsured.  Did restart her on her medications while in the hospital and patient's blood pressure was 161/104 at the time of discharge.  No signs of endorgan damage on BMP, troponin and EKG were not suspicious for ACS and his CT and MRI were unremarkable  Migraine headache with aura Blurred vision Nausea Patient has had past history of migraines with aura last several weeks.  Blurred vision is more photophobia and patient has lightheaded, dizziness and unsteadiness on feet while ambulating.  CT and MRI were unremarkable.  Wanted to rule out any normal pressure hydrocephalus due to patient's issues with unsteadiness or any stroke as patient has left sided upper and lower extremity sensory deficits  Chest pain and shortness of breath on exertion Asymptomatic on evaluation / exam.  States last week she had episodes of CP and SOB with exertion.  Troponin unremarkable, EKG sinus rhythm no ST depression, inverted T waves, ST elevation.    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  Patient noted that since her deficit is chronic and not associated with her new symptoms of headache or high blood pressure.  Type 2 diabetes  Repeat A1c was 9.5 today from 7.3 in March. patient has been unable to afford Jardiance .  We were able to obtain prior authorization for Jardiance  and Ozempic with Jardiance  being  $45 and Ozempic being $24.99  Hyperlipidemia  Last lipid panel on 04/29/2023 showed an LDL of 118.  Patient has not picked up simvastatin  since March 2025.  Will restart rosuvastatin 40 mg and defer repeat lipid panel for another 3 months.  She had fasting blood glucose of 237 on admission.  Will follow-up outpatient and get patient on better regimen    Discharge subjective: Patient states that she is doing okay this morning.  She does not have any complaints at this time.  Says that her blood pressure has been uncontrolled because she was just switching insurance this but she does have insurance now.  Did discuss with her that her blood pressure came down like it was doing then patient could go home.  Patient was agreeable to this plan and following up in the internal medicine clinic  Discharge Exam:   BP (!) 165/98   Pulse 89   Temp 98.5 F (36.9 C) (Oral)   Resp 17   Ht 5' 5 (1.651 m)   Wt (!) 146.1 kg   SpO2 98%   BMI 53.60 kg/m  Discharge exam:  Physical Exam Cardiovascular:     Rate and Rhythm:  Normal rate and regular rhythm.  Pulmonary:     Effort: Pulmonary effort is normal. No respiratory distress.     Breath sounds: No wheezing.  Musculoskeletal:     Right lower leg: Edema present.     Left lower leg: Edema present.  Neurological:     Mental Status: She is alert.      Pertinent Labs, Studies, and Procedures:     Latest Ref Rng & Units 01/26/2024    5:34 PM 01/26/2024    5:25 PM 01/21/2024    8:57 AM  CBC  WBC 4.0 - 10.5 K/uL  7.9  9.6   Hemoglobin 12.0 - 15.0 g/dL 85.3  86.2  86.8    85.3   Hematocrit 36.0 - 46.0 % 43.0  43.5  43.2    43.0   Platelets 150 - 400 K/uL  291  278        Latest Ref Rng & Units 01/27/2024    2:29 AM 01/26/2024    5:34 PM 01/26/2024    5:25 PM  CMP  Glucose 70 - 99 mg/dL 720  748  762   BUN 6 - 20 mg/dL 10  11  11    Creatinine 0.44 - 1.00 mg/dL 9.21  9.19  9.08   Sodium 135 - 145 mmol/L 138  136  135   Potassium 3.5 - 5.1  mmol/L 3.5  3.8  3.7   Chloride 98 - 111 mmol/L 99  103  102   CO2 22 - 32 mmol/L 24   21   Calcium  8.9 - 10.3 mg/dL 8.8   8.9     MR BRAIN WO CONTRAST Result Date: 01/27/2024 CLINICAL DATA:  Neuro deficit, acute, stroke suspected EXAM: MRI HEAD WITHOUT CONTRAST TECHNIQUE: Multiplanar, multiecho pulse sequences of the brain and surrounding structures were obtained without intravenous contrast. COMPARISON:  CTA head/neck 01/26/2024. FINDINGS: Brain: No acute infarction, hemorrhage, hydrocephalus, extra-axial collection or mass lesion. Partially empty sella. Vascular: Normal flow voids. Skull and upper cervical spine: Normal marrow signal. Sinuses/Orbits: Negative. Other: No sizable mastoid effusions. IMPRESSION: No evidence of acute intracranial abnormality. Electronically Signed   By: Gilmore GORMAN Molt M.D.   On: 01/27/2024 01:38   CT ANGIO HEAD NECK W WO CM Result Date: 01/26/2024 CLINICAL DATA:  Carotid artery aneurysm suspected EXAM: CT ANGIOGRAPHY HEAD AND NECK WITH AND WITHOUT CONTRAST TECHNIQUE: Multidetector CT imaging of the head and neck was performed using the standard protocol during bolus administration of intravenous contrast. Multiplanar CT image reconstructions and MIPs were obtained to evaluate the vascular anatomy. Carotid stenosis measurements (when applicable) are obtained utilizing NASCET criteria, using the distal internal carotid diameter as the denominator. RADIATION DOSE REDUCTION: This exam was performed according to the departmental dose-optimization program which includes automated exposure control, adjustment of the mA and/or kV according to patient size and/or use of iterative reconstruction technique. CONTRAST:  75mL OMNIPAQUE  IOHEXOL  350 MG/ML SOLN COMPARISON:  CT head 01/21/2024 FINDINGS: CT HEAD FINDINGS Brain: No evidence of acute infarction, hemorrhage, hydrocephalus, extra-axial collection or mass lesion/mass effect. Vascular: See below. Skull: No acute fracture.  Sinuses/Orbits: Clear sinuses.  No acute orbital findings. Other: No mastoid effusions. Review of the MIP images confirms the above findings CTA NECK FINDINGS Aortic arch: Great vessel origins are patent without significant stenosis. Right carotid system: No evidence of dissection, stenosis (50% or greater), or occlusion. Left carotid system: No evidence of dissection, stenosis (50% or greater), or occlusion. Vertebral arteries: Left dominant. No evidence of dissection, stenosis (  50% or greater), or occlusion. Skeleton: No acute abnormality on limited assessment. Bridging anterior osteophytes. Other neck: No acute abnormality on limited assessment. Upper chest: Lung apices are clear. Review of the MIP images confirms the above findings CTA HEAD FINDINGS Anterior circulation: Bilateral intracranial ICAs, MCAs, and ACAs are patent without proximal hemodynamically significant stenosis. No aneurysm identified. Posterior circulation: Bilateral intradural vertebral arteries, basilar artery and bilateral posterior cerebral arteries are patent without proximal hemodynamically significant stenosis. No aneurysm identified. Venous sinuses: As permitted by contrast timing, patent. Review of the MIP images confirms the above findings IMPRESSION: 1. No evidence of acute intracranial abnormality. 2. No large vessel occlusion or proximal hemodynamically significant stenosis. 3. No aneurysm identified. Electronically Signed   By: Gilmore GORMAN Molt M.D.   On: 01/26/2024 18:55   DG Chest 2 View Result Date: 01/26/2024 CLINICAL DATA:  Chest pain. EXAM: CHEST - 2 VIEW COMPARISON:  January 21, 2024. FINDINGS: The heart size and mediastinal contours are within normal limits. Both lungs are clear. The visualized skeletal structures are unremarkable. IMPRESSION: No active cardiopulmonary disease. Electronically Signed   By: Lynwood Landy Raddle M.D.   On: 01/26/2024 18:03     Discharge Instructions: Discharge Instructions      Ambulatory referral to Cardiology   Complete by: As directed    Diet - low sodium heart healthy   Complete by: As directed    Increase activity slowly   Complete by: As directed        Signed: D'Mello, Orval Dortch, DO 01/27/2024, 3:25 PM

## 2024-01-27 NOTE — Telephone Encounter (Signed)
 Pharmacy Patient Advocate Encounter  Received notification from [employer] that Prior Authorization for Jardiance  10MG  tablets has been APPROVED from 01/27/2024 to 01/26/2025. Ran test claim, Copay is $45.00. This test claim was processed through [facility]- copay amounts may vary at other pharmacies due to pharmacy/plan contracts, or as the patient moves through the different stages of their insurance plan.   PA #/Case ID/Reference #: [id number]

## 2024-01-27 NOTE — TOC Initial Note (Signed)
 Transition of Care (TOC) - Initial/Assessment Note   Consult for medicine assistance.   Discussed with patient . Patient now has insurance .   Following medications are on [facility] $4 list :   Amlodipine , losarton, hydrochlorothiazole, spironolactone , Celexa , Metoprolol , Simvastatin . Albuterol  inhaler HFA is $24 .   Patient usually uses [facility] chatted pharmacy for benefit check on  on Jariance, Victoza  and allopurinol  .   Discussed TOC Pharmacy with patient. Prescriptions can be sent to [facility] at discharge. TOC Pharmacy will fill prescriptions through her insurance, she will have to pay co pays , medications will be delivered to bedside prior to her leaving hospital. Patient in agreement.    Patient Details  Name: Courtney Clayton MRN: [id number] Date of Birth: 1971-01-10  Transition of Care [facility]) CM/SW Contact:    Stephane Powell Jansky, RN Phone Number: 01/27/2024, 9:37 AM  Clinical Narrative:                   Expected Discharge Plan: Home/Self Care Barriers to Discharge: Continued Medical Work up   Patient Goals and CMS Choice Patient states their goals for this hospitalization and ongoing recovery are:: to return to home   Choice offered to / list presented to : NA      Expected Discharge Plan and Services   Discharge Planning Services: CM Consult Post Acute Care Choice: NA Living arrangements for the past 2 months: Apartment                 DME Arranged: N/A DME Agency: NA       HH Arranged: NA HH Agency: NA        Prior Living Arrangements/Services Living arrangements for the past 2 months: Apartment Lives with:: Other (Comment) (21 year old god son) Patient language and need for interpreter reviewed:: Yes Do you feel safe going back to the place where you live?: Yes      Need for Family Participation in Patient Care: No (Comment) Care giver support system in place?: Yes (comment)   Criminal Activity/Legal Involvement  Pertinent to Current Situation/Hospitalization: No - Comment as needed  Activities of Daily Living   ADL Screening (condition at time of admission) Independently performs ADLs?: Yes (appropriate for developmental age) Is the patient deaf or have difficulty hearing?: No Does the patient have difficulty seeing, even when wearing glasses/contacts?: No Does the patient have difficulty concentrating, remembering, or making decisions?: No  Permission Sought/Granted   Permission granted to share information with : No              Emotional Assessment Appearance:: Appears stated age Attitude/Demeanor/Rapport: Engaged Affect (typically observed): Appropriate Orientation: : Oriented to Self, Oriented to Place, Oriented to  Time, Oriented to Situation Alcohol / Substance Use: Not Applicable Psych Involvement: No (comment)  Admission diagnosis:  Hypertensive emergency [I16.1] Severe hypertension [I10] Patient Active Problem List   Diagnosis Date Noted   Severe hypertension 01/27/2024   T2DM (type 2 diabetes mellitus) (HCC) 01/27/2024   Migraine with aura 01/27/2024   Left arm pain 07/19/2023   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 [place] [address] Phone: [phone] Fax: [phone]  [id number] - [address] ([place]), [place] - [address] [place] ([place]) [place] [address] Phone: [phone] Fax: [phone]  [place] [facility] [address] [place]  [address] Phone: [phone] Fax: [phone]  [place] [facility] [address] [place] [address] Phone: [phone] Fax: [phone]  [facility] [address] [place] [address] Phone: [phone] Fax: [phone]     Social Drivers of Health (SDOH) Social History: SDOH Screenings   Food Insecurity: No Food Insecurity (01/27/2024)  Transportation Needs: No Transportation Needs (01/27/2024)  Depression (PHQ2-9): High Risk (01/26/2024)  Social Connections: Unknown (03/01/2023)   Received from [facility]  Tobacco Use: Low Risk  (01/26/2024)   SDOH Interventions:     Readmission Risk Interventions     No data to display

## 2024-01-27 NOTE — Discharge Instructions (Addendum)
 You were hospitalized because you were having some high blood pressure.  We started you back on your blood pressure medications and your blood pressure came back down.  Thank you for allowing us  to be part of your care.   You will have an appointment on 10/2 at 3:30 pm at the Internal Medicine Center  Please take these medications: Propranolol  80 mg Simvastatin  40 mg Amlodipine  10 mg Hydrochlorothiazide  25 mg Losartan  100 mg daily Spironolactone  25 mg daily  I have also given you some propranolol  80 mg that will hopefully help with your migraines.  They can help prevent your migraines  We have started the process of getting you approval for Jardiance  10 mg.  I have sent it to your pharmacy but you may still have a cost associated with it.  When we see you in clinic we will figure out a plan to get your diabetes under better control  Please call our clinic if you have any questions or concerns, we may be able to help and keep you from a long and expensive emergency room wait. Our clinic and after hours phone number is [phone], the best time to call is Monday through Friday 9 am to 4 pm but there is always someone available 24/7 if you have an emergency. If you need medication refills please notify your pharmacy one week in advance and they will send us  a request.

## 2024-01-27 NOTE — Inpatient Diabetes Management (Addendum)
 Inpatient Diabetes Program Recommendations  AACE/ADA: New Consensus Statement on Inpatient Glycemic Control   Target Ranges:  Prepandial:   less than 140 mg/dL      Peak postprandial:   less than 180 mg/dL (1-2 hours)      Critically ill patients:  140 - 180 mg/dL    Latest Reference Range & Units 01/26/24 17:25 01/26/24 17:34 01/27/24 02:29  Glucose 70 - 99 mg/dL 762 (H) 748 (H) 720 (H)    Latest Reference Range & Units 07/19/23 08:48 01/26/24 17:25  Hemoglobin A1C 4.8 - 5.6 % 7.3 ! Pend 9.5 (H)    Review of Glycemic Control  Diabetes history: DM2 Outpatient Diabetes medications: None; had been on Metformin  but did not tolerate; never started Jardiance  10 mg daily due to cost Current orders for Inpatient glycemic control: Jardiance  10 mg QAM, Novolog  0-9 units TID with meals  Inpatient Diabetes Program Recommendations:    HbgA1C:  A1C 9.5% on 01/26/24 indicating an average glucose of 226 mg/dl over the past 2-3 months.  Outpatient DM: At time of discharge, please provide Rx for glucose monitoring kit (#[id number]) and DM medications prescribing at discharge.  NOTE: Spoke with patient over the phone about diabetes and home regimen for diabetes control. Patient reports she had been taking Metformin  in the past but did not tolerate it (GI upset). She reports that she was prescribed Jardiance  and Ozempic but she never started either one because they were to expensive. Patient reports that she applied for medication assistance with the Ozempic but she was denied.  Patient reports she has an old glucometer at home and she will need to get testing supplies. Informed patient it would be requested that she get Rx for new glucose monitoring kit and supplies.  Patient reports that she is stress eater and she tries to follow carb modified diet but at times she eats more when she is stressed. Patient reports that she had been drinking regular soda but she cut them out about 3 weeks ago and replaced it  with juice. She has been trying to limit juice to 1/2 cup with meals.  Discussed using sugar free fluids and encouraged patient to eliminate sugar from beverages.  Discussed A1C results (9.5% on 01/26/24) and explained that current A1C indicates an average glucose of 226 mg/dl over the past 2-3 months. Discussed glucose and A1C goals. Discussed importance of checking CBGs and maintaining good CBG control to prevent long-term and short-term complications. Discussed impact of nutrition, exercise, stress, sickness, and medications on diabetes control.  Informed patient that I would ask outpatient [facility] pharmacy to check and see if Jardiance  is covered and what copay would be; will also ask about Ozempic.  Encouraged patient to talk with PCP if she has any issues with cost of medications in case more affordable DM medications need to be prescribed. Patient states she is suppose to be filling out paperwork to see if she can get medication assistance as well.   Patient reports her insurance through employer just started this month and she is hopeful that she will be able to afford DM medications going forward. Patient verbalized understanding of information discussed and reports no further questions at this time related to diabetes.  Per outpatient [facility] pharmacy check, Jardiance  and Ozempic requires a prior authorization. Outpatient TOC pharmacy done prior authorization for both the Jardiance  and Ozempic which were approved and Jardiance  is $45 copay and Ozempic is $24 copay. Called patient back to let her know copay amounts and  patient states she would be able to afford.   Thanks, Earnie Gainer, RN, MSN, CDE Diabetes Coordinator Inpatient Diabetes Program [phone] (Team Pager)

## 2024-01-27 NOTE — Hospital Course (Addendum)
 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  She does have an aldosterone/renin ratio of greater than 31.1 that was found back in March.  He had her medications refilled on 01/21/2024 but she was in the emergency room for hypertensive urgency she is supposed to be on amlodipine  10, losartan  100 and hydrocortisone  25 and spironolactone  25.  She had been unable to pick up spironolactone  due to cost being uninsured.  Did restart her on her medications while in the hospital and patient's blood pressure was 161/104 at the time of discharge.  No signs of endorgan damage on BMP, troponin and EKG were not suspicious for ACS and his CT and MRI were unremarkable  Migraine headache with aura Blurred vision Nausea Patient has had past history of migraines with aura last several weeks.  Blurred vision is more photophobia and patient has lightheaded, dizziness and unsteadiness on feet while ambulating.  CT and MRI were unremarkable.  Wanted to rule out any normal pressure hydrocephalus due to patient's issues with unsteadiness or any stroke as patient has left sided upper and lower extremity sensory deficits.  Chest pain and shortness of breath on exertion Asymptomatic on evaluation / exam.  States last week she had episodes of CP and SOB with exertion.  Troponin unremarkable, EKG sinus rhythm no ST depression, inverted T waves, ST elevation.    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  Patient noted that since her deficit is chronic and not associated with her new symptoms of headache or high blood pressure.  Patient did not have any gait abnormalities when walked in the ED.  Type 2 diabetes  Repeat A1c  was 9.5 today from 7.3 in March. patient has been unable to afford Jardiance .  We were able to obtain prior authorization for Jardiance  and Ozempic with Jardiance  being $45 and Ozempic being $24.99  Hyperlipidemia  Last lipid panel on 04/29/2023 showed an LDL of 118.  Patient has not picked up simvastatin  since March 2025.  Will restart rosuvastatin 40 mg and defer repeat lipid panel for another 3 months.  She had fasting blood glucose of 237 on admission.  Will follow-up outpatient and get patient on better regimen

## 2024-01-27 NOTE — Plan of Care (Signed)

## 2024-01-28 NOTE — Progress Notes (Signed)
 Internal Medicine Clinic Attending  Case discussed with the resident at the time of the visit.  We reviewed the resident's history and exam and pertinent patient test results.  I agree with the assessment, diagnosis, and plan of care documented in the resident's note.

## 2024-02-09 ENCOUNTER — Ambulatory Visit (INDEPENDENT_AMBULATORY_CARE_PROVIDER_SITE_OTHER)

## 2024-02-09 ENCOUNTER — Telehealth: Payer: Self-pay | Admitting: *Deleted

## 2024-02-09 VITALS — BP 129/85 | HR 76 | Temp 98.1°F | Ht 65.0 in | Wt 317.0 lb

## 2024-02-09 DIAGNOSIS — E119 Type 2 diabetes mellitus without complications: Secondary | ICD-10-CM | POA: Diagnosis not present

## 2024-02-09 DIAGNOSIS — I152 Hypertension secondary to endocrine disorders: Secondary | ICD-10-CM

## 2024-02-09 DIAGNOSIS — F32A Depression, unspecified: Secondary | ICD-10-CM | POA: Diagnosis not present

## 2024-02-09 DIAGNOSIS — G43109 Migraine with aura, not intractable, without status migrainosus: Secondary | ICD-10-CM | POA: Diagnosis not present

## 2024-02-09 MED ORDER — SPIRONOLACTONE 25 MG PO TABS: 25.0000 mg | ORAL_TABLET | Freq: Every day | ORAL | 0 refills | Status: AC

## 2024-02-09 MED ORDER — SERTRALINE HCL 25 MG PO TABS: 25.0000 mg | ORAL_TABLET | Freq: Every day | ORAL | 2 refills | Status: DC

## 2024-02-09 MED ORDER — SEMAGLUTIDE(0.25 OR 0.5MG/DOS) 2 MG/3ML ~~LOC~~ SOPN: 0.2500 mg | PEN_INJECTOR | SUBCUTANEOUS | 2 refills | Status: DC

## 2024-02-09 NOTE — Assessment & Plan Note (Signed)
 Patient was able to pick up Jardiance  and has had no dysuria or signs of yeast infections to starting.  Prior Auth was approved at last hospitalization for Ozempic and patient states that she would like to go pick this up.  A1c was 9.5 last month.  Patient has not had diabetic eye exam this year but states that she will be going to her eye exam soon.  Plan: Will start Ozempic 0.25 mg once a week for 4 weeks and inform patient that if she is able to tolerate this dose she can go up to Ozempic 0.5 mg once a week for 4 weeks Will continue Jardiance  10 mg May need to start insulin  and next A1c check pending A1c as patient has not been tolerant to metformin  in the past.

## 2024-02-09 NOTE — Progress Notes (Signed)
 Established Patient Office Visit  Subjective   Patient ID: Courtney Clayton, female    DOB: [birth year]  Age: 53 y.o. MRN: [id number]  Chief Complaint  Patient presents with   Hospitalization Follow-up    HPI Courtney Clayton is a 53 year old female with past medical history of hypertension secondary to hyperaldosteronism, morbid obesity with BMI of 50-59.9, type 2 diabetes mellitus without use of insulin  that presents today for follow-up after hospitalization.  See problem-based assessment for more details.   ROS See problem-based assessment for more details   Objective:     BP 129/85 (BP Location: Right Arm, Patient Position: Sitting, Cuff Size: Large)   Pulse 76   Temp 98.1 F (36.7 C) (Oral)   Ht 5' 5 (1.651 m)   Wt (!) 317 lb (143.8 kg)   SpO2 97%   BMI 52.75 kg/m  BP Readings from Last 3 Encounters:  02/09/24 129/85  01/27/24 (!) 165/98  01/26/24 (!) 202/108   Wt Readings from Last 3 Encounters:  02/09/24 (!) 317 lb (143.8 kg)  01/27/24 (!) 322 lb 1.5 oz (146.1 kg)  01/26/24 (!) 322 lb 3.2 oz (146.1 kg)      Physical Exam Constitution: Alert, no acute distress Lungs: Respiratory effort normal, lungs clear to auscultation with diminished breath sounds at the bases Heart: Regular rate and rhythm, no murmurs heard Lower extremities: Bilateral ankle edema that is nonpitting  No results found for any visits on 02/09/24.  Last hemoglobin A1c Lab Results  Component Value Date   HGBA1C 9.5 (H) 01/26/2024      The ASCVD Risk score (Arnett DK, et al., 2019) failed to calculate for the following reasons:   Risk score cannot be calculated because patient has a medical history suggesting prior/existing ASCVD    Assessment & Plan:   Problem List Items Addressed This Visit     Controlled type 2 diabetes mellitus without complication, without long-term current use of insulin  (HCC)   Patient was able to pick up Jardiance  and has had no dysuria or signs of yeast  infections to starting.  Prior Auth was approved at last hospitalization for Ozempic and patient states that she would like to go pick this up.  A1c was 9.5 last month.  Patient has not had diabetic eye exam this year but states that she will be going to her eye exam soon.  Plan: Will start Ozempic 0.25 mg once a week for 4 weeks and inform patient that if she is able to tolerate this dose she can go up to Ozempic 0.5 mg once a week for 4 weeks Will continue Jardiance  10 mg May need to start insulin  and next A1c check pending A1c as patient has not been tolerant to metformin  in the past.      Relevant Medications   Semaglutide,0.25 or 0.5MG /DOS, 2 MG/3ML SOPN   Depression   Patient states that she has been on bupropion  for a long time but states that this is not helping anymore.  She was prescribed Celexa  at a previous visit but states that she did not feel that this helped.  She states she feels like her depression is worse at this time and does not have the energy or motivation to do anything, and finds herself crying a lot.  Has strong family history of depression as well.  Plan: Will switch to sertraline 25 mg, counseled patient that this may take 6 to 12 weeks to take effect Referral for integrated behavioral health  Relevant Medications   sertraline (ZOLOFT) 25 MG tablet   Other Relevant Orders   Ambulatory referral to Integrated Behavioral Health   Hypertension 2/2 primary hyperaldosteronism - Primary   Patient was started on spironolactone  at the time of last hospitalization although blood pressure normalized without spironolactone  being started.  Blood pressure today is 129/85.  Patient states that blood pressures have been in the low 140s high 130s at home.  Patient states that she has been logging these blood pressures but will bring in blood pressure log at next visit   Plan: Continue spironolactone  25 mg losartan  100 mg, amlodipine  10 mg, hydrochlorothiazide  25  mg Patient will bring in blood pressure log at next visit Will check BMP today      Relevant Medications   spironolactone  (ALDACTONE ) 25 MG tablet   Other Relevant Orders   Basic metabolic panel with GFR   Migraine with aura   Patient was given propranolol  80 mg extended release for migraine prophylaxis at time of discharge from hospital.  Sumatriptan is out of the question as patient has hypertension and history of hypertensive emergency.  Patient is unsure if she is taking this medication but states that headaches are better      Relevant Medications   spironolactone  (ALDACTONE ) 25 MG tablet   sertraline (ZOLOFT) 25 MG tablet      Depression    Return in about 3 months (around 05/11/2024).    Morelia Cassells D'Mello, DO Patient seen with Dr.Chambliss

## 2024-02-09 NOTE — Assessment & Plan Note (Signed)
 Patient states that she has been on bupropion  for a long time but states that this is not helping anymore.  She was prescribed Celexa  at a previous visit but states that she did not feel that this helped.  She states she feels like her depression is worse at this time and does not have the energy or motivation to do anything, and finds herself crying a lot.  Has strong family history of depression as well.  Plan: Will switch to sertraline 25 mg, counseled patient that this may take 6 to 12 weeks to take effect Referral for integrated behavioral health

## 2024-02-09 NOTE — Telephone Encounter (Signed)
 Copied from CRM [id number]. Topic: Appointments - Appointment Info/Confirmation >> Feb 09, 2024  2:12 PM Miquel SAILOR wrote: Patient/patient representative is calling for information regarding an appointment.   Gave info on 15 min late anything after 3:45 pm PT knows to reschedule

## 2024-02-09 NOTE — Patient Instructions (Addendum)
 Today we discussed the following medical conditions and plan:   Glad to see that your blood pressure is doing better.  We will check your kidney function and electrolytes today and I will call you with those results.  For your depression I have sent a referral for you to talk to our behavioral health specialist Renda and we have also started you on the medication sertraline at the lowest dose.  Keep in mind that this medication may take 6 to 12 weeks to really start taking effect  For your diabetes I am glad that you are able to get the Jardiance , we will add Ozempic at the dose of 0.25 mg for 4 weeks.  You will be taking this medication once every week.  After 4 weeks if you are tolerating this medication well you can increase the dose to 0.5 mg once a week for 4 weeks  We look forward to seeing you next time. Please call our clinic at [phone] if you have any questions or concerns. The best time to call is Monday-Friday from 9am-4pm, but there is someone available 24/7. If you need medication refills, please notify your pharmacy one week in advance and they will send us  a request.   Thank you for trusting me with your care. Wishing you the best! I would like to see you back in another 3 months  Alaa Eyerman D'Mello, DO  [facility]

## 2024-02-09 NOTE — Assessment & Plan Note (Signed)
 Patient was given propranolol  80 mg extended release for migraine prophylaxis at time of discharge from hospital.  Sumatriptan is out of the question as patient has hypertension and history of hypertensive emergency.  Patient is unsure if she is taking this medication but states that headaches are better

## 2024-02-09 NOTE — Assessment & Plan Note (Signed)
 Patient was started on spironolactone  at the time of last hospitalization although blood pressure normalized without spironolactone  being started.  Blood pressure today is 129/85.  Patient states that blood pressures have been in the low 140s high 130s at home.  Patient states that she has been logging these blood pressures but will bring in blood pressure log at next visit   Plan: Continue spironolactone  25 mg losartan  100 mg, amlodipine  10 mg, hydrochlorothiazide  25 mg Patient will bring in blood pressure log at next visit Will check BMP today

## 2024-02-10 ENCOUNTER — Ambulatory Visit: Payer: Self-pay

## 2024-02-10 LAB — BASIC METABOLIC PANEL WITH GFR
BUN/Creatinine Ratio: 16 (ref 9–23)
BUN: 14 mg/dL (ref 6–24)
CO2: 23 mmol/L (ref 20–29)
Calcium: 9.6 mg/dL (ref 8.7–10.2)
Chloride: 95 mmol/L — ABNORMAL LOW (ref 96–106)
Creatinine, Ser: 0.9 mg/dL (ref 0.57–1.00)
Glucose: 181 mg/dL — ABNORMAL HIGH (ref 70–99)
Potassium: 4.1 mmol/L (ref 3.5–5.2)
Sodium: 135 mmol/L (ref 134–144)
eGFR: 77 mL/min/1.73 (ref >59)

## 2024-02-13 NOTE — Progress Notes (Signed)
 Internal Medicine Clinic Attending  I was physically present during the key portions of the resident provided service and participated in the medical decision making of patient's management care. I reviewed pertinent patient test results.  The assessment, diagnosis, and plan were formulated together and I agree with the documentation in the resident's note.  Jeanelle Layman CROME, MD

## 2024-02-20 ENCOUNTER — Other Ambulatory Visit (HOSPITAL_COMMUNITY): Payer: Self-pay

## 2024-02-20 ENCOUNTER — Other Ambulatory Visit: Payer: Self-pay | Admitting: Student

## 2024-02-21 ENCOUNTER — Other Ambulatory Visit (HOSPITAL_COMMUNITY): Payer: Self-pay

## 2024-02-21 ENCOUNTER — Other Ambulatory Visit: Payer: Self-pay

## 2024-02-21 ENCOUNTER — Encounter (HOSPITAL_COMMUNITY): Payer: Self-pay

## 2024-02-21 MED ORDER — SPIRONOLACTONE 25 MG PO TABS
25.0000 mg | ORAL_TABLET | Freq: Every day | ORAL | 0 refills | Status: DC
  Filled 2024-02-21 – 2024-02-27 (×2): qty 30, 30d supply, fill #0

## 2024-02-21 MED ORDER — SIMVASTATIN 40 MG PO TABS
40.0000 mg | ORAL_TABLET | Freq: Every day | ORAL | 0 refills | Status: DC
  Filled 2024-02-21 – 2024-02-27 (×2): qty 30, 30d supply, fill #0

## 2024-02-21 MED ORDER — EMPAGLIFLOZIN 10 MG PO TABS
10.0000 mg | ORAL_TABLET | Freq: Every day | ORAL | 0 refills | Status: DC
  Filled 2024-02-21 – 2024-02-27 (×2): qty 30, 30d supply, fill #0

## 2024-02-27 ENCOUNTER — Other Ambulatory Visit (HOSPITAL_COMMUNITY): Payer: Self-pay

## 2024-02-27 ENCOUNTER — Other Ambulatory Visit: Payer: Self-pay

## 2024-02-29 ENCOUNTER — Telehealth: Payer: Self-pay

## 2024-02-29 NOTE — Telephone Encounter (Signed)
 Patient was referred by [facility] Arland Plyler for consideration of the LIBERATE study. LVM with patient requesting call back to consider enrollment.   Lorain Baseman, PharmD [facility] [phone]

## 2024-03-08 ENCOUNTER — Telehealth (INDEPENDENT_AMBULATORY_CARE_PROVIDER_SITE_OTHER): Payer: Self-pay | Admitting: Licensed Clinical Social Worker

## 2024-03-08 ENCOUNTER — Institutional Professional Consult (permissible substitution): Payer: Self-pay | Admitting: Licensed Clinical Social Worker

## 2024-03-08 DIAGNOSIS — F32A Depression, unspecified: Secondary | ICD-10-CM

## 2024-03-08 NOTE — Telephone Encounter (Signed)
 Patient no showed to in-person appointment on today. [facility] attempted patient and left a VM.  Renda Pontes, MSW, LCSW-A She/Her Behavioral Health Clinician [facility]  Internal Medicine Center

## 2024-03-21 ENCOUNTER — Other Ambulatory Visit: Payer: Self-pay | Admitting: Student

## 2024-03-21 ENCOUNTER — Other Ambulatory Visit: Payer: Self-pay

## 2024-03-21 MED ORDER — SIMVASTATIN 40 MG PO TABS
40.0000 mg | ORAL_TABLET | Freq: Every day | ORAL | 3 refills | Status: AC
  Filled 2024-03-21 – 2024-03-22 (×2): qty 90, 90d supply, fill #0

## 2024-03-21 MED ORDER — SPIRONOLACTONE 25 MG PO TABS
25.0000 mg | ORAL_TABLET | Freq: Every day | ORAL | 3 refills | Status: AC
  Filled 2024-03-21 – 2024-03-22 (×2): qty 90, 90d supply, fill #0

## 2024-03-21 NOTE — Telephone Encounter (Signed)
 Medication sent to pharmacy

## 2024-03-22 ENCOUNTER — Other Ambulatory Visit: Payer: Self-pay

## 2024-03-22 ENCOUNTER — Other Ambulatory Visit (HOSPITAL_COMMUNITY): Payer: Self-pay

## 2024-03-22 ENCOUNTER — Encounter

## 2024-03-22 NOTE — Progress Notes (Deleted)
 S:     No chief complaint on file.  53 y.o. female who presents for diabetes evaluation, education, and management in the context of the LIBERATE Study.  Today, patient arrives in *** good spirits and presents without *** any assistance. ***Patient is accompanied by ***.   Patient was referred and last seen by Primary Care Provider, Dr. Kem, on 02/09/24. At that time, she was taking Jardiance  and planned to start Ozempic. Encouraged patient to keep BP log.   PMH is significant for HTN due to primary hyperaldosteronism, chronic migraine with aura, asthma, T2DM, HLD, BMI > 50, depression.   Patient reports Diabetes was diagnosed ***.   Family/Social History: ***  Current diabetes medications include: Jardiance  10 mg daily, Ozempic 0.25 mg weekly Current hypertension medications include: amlodipine  10 mg daily, hydrochlorothiazide  25 mg daily, losartan  100 mg daily, spironolactone  25 mg daily Current hyperlipidemia medications include: simvastatin  40 mg daily  Patient reports adherence to taking all medications as prescribed.  *** Patient denies adherence with medications, reports missing *** medications *** times per week, on average.  Do you feel that your medications are working for you? {YES NO:22349} Have you been experiencing any side effects to the medications prescribed? {YES NO:22349} Do you have any problems obtaining medications due to transportation or finances? {YES [id number] Insurance coverage: ***  Patient {Actions; denies-reports:120008} hypoglycemic events.  @CGMFLO @  Patient {Actions; denies-reports:120008} nocturia (nighttime urination).  Patient {Actions; denies-reports:120008} neuropathy (nerve pain). Patient {Actions; denies-reports:120008} visual changes. Patient {Actions; denies-reports:120008} self foot exams.   Patient reported dietary habits: Eats *** meals/day Breakfast: *** Lunch: *** Dinner: *** Snacks: *** Drinks: ***  Within the past 12  months, did you worry whether your food would run out before you got money to buy more? {YES NO:22349} Within the past 12 months, did the food you bought run out, and you didn't have money to get more? {YES NO:22349} PHQ-9 Score: ***  Patient-reported exercise habits: ***   O:   ROS  Physical Exam  Lab Results  Component Value Date   HGBA1C 9.5 (H) 01/26/2024   HGBA1C 7.3 (A) 07/19/2023   HGBA1C 7.3 (A) 04/29/2023    There were no vitals filed for this visit.   Lipid Panel     Component Value Date/Time   CHOL 188 04/29/2023 0951   TRIG 61 04/29/2023 0951   HDL 59 04/29/2023 0951   CHOLHDL 3.2 04/29/2023 0951   CHOLHDL 4.2 06/24/2022 0440   VLDL 14 06/24/2022 0440   LDLCALC 118 (H) 04/29/2023 0951    Clinical Atherosclerotic Cardiovascular Disease (ASCVD): {YES/NO:21197} The ASCVD Risk score (Arnett DK, et al., 2019) failed to calculate for the following reasons:   Risk score cannot be calculated because patient has a medical history suggesting prior/existing ASCVD      A/P:  LIBERATE Study:  -Patient {liberateconsent:28834} verbal consent to participate in the study. Consent documented in electronic medical record.  -Provided education on Libre 3 CGM. Collaborated to ensure Herlene 3 app was downloaded on patient's phone. Educated on how to place sensor every 14 days, patient placed first sensor correctly and verbalized understanding of use, removal, and how to place next sensor. Discussed alarms. 8 sensors provided for a 3 month supply. Educated to contact the office if the sensor falls off early and replacements are needed before their next [employer].    Diabetes longstanding *** currently ***. Patient is *** able to verbalize appropriate hypoglycemia management plan. Medication adherence appears ***. Control is  suboptimal due to ***. -{Meds adjust:18428} basal insulin  *** (insulin  ***). Patient will continue to titrate 1 unit every *** days if fasting blood  sugar > 100mg /dl until fasting blood sugars reach goal or next visit.  -{Meds adjust:18428} rapid insulin  *** (insulin  ***) to ***.  -{Meds adjust:18428} GLP-1 *** (generic ***) to ***.  -{Meds adjust:18428} SGLT2-I *** (generic ***) to ***. Counseled on sick day rules. -{Meds adjust:18428} metformin  *** to ***.  -Patient educated on purpose, proper use, and potential adverse effects of ***.  -Extensively discussed pathophysiology of diabetes, recommended lifestyle interventions, dietary effects on blood sugar control.  -Counseled on s/sx of and management of hypoglycemia.  -Next A1c anticipated ***.   ASCVD risk - primary ***secondary prevention in patient with diabetes. Last LDL is *** not at goal of <29 *** mg/dL. ASCVD risk factors include *** and 10-year ASCVD risk score of ***. {Desc; low/moderate/high:110033} intensity statin indicated.  -{Meds adjust:18428} ***statin *** mg.   Hypertension longstanding *** currently ***. Blood pressure goal of <130/80 *** mmHg. Medication adherence ***. Blood pressure control is suboptimal due to ***. -***  Written patient instructions provided. Patient verbalized understanding of treatment plan.  Total time in face to face counseling *** minutes.    Follow-up:  Pharmacist ***. PCP clinic visit in ***.    Lorain Baseman, PharmD [facility] [phone]

## 2024-03-31 ENCOUNTER — Other Ambulatory Visit: Payer: Self-pay | Admitting: Student

## 2024-04-02 ENCOUNTER — Other Ambulatory Visit (HOSPITAL_COMMUNITY): Payer: Self-pay

## 2024-04-02 MED ORDER — EMPAGLIFLOZIN 10 MG PO TABS
10.0000 mg | ORAL_TABLET | Freq: Every day | ORAL | 1 refills | Status: AC
  Filled 2024-04-02 – 2024-04-03 (×2): qty 90, 90d supply, fill #0

## 2024-04-02 NOTE — Telephone Encounter (Signed)
 Medication sent to pharmacy

## 2024-04-03 ENCOUNTER — Other Ambulatory Visit (HOSPITAL_COMMUNITY): Payer: Self-pay

## 2024-04-03 ENCOUNTER — Other Ambulatory Visit: Payer: Self-pay

## 2024-04-10 ENCOUNTER — Telehealth: Payer: Self-pay

## 2024-04-10 NOTE — Progress Notes (Unsigned)
 Complex Care Management Care Guide Note  04/10/2024 Name: Courtney Clayton MRN: [id number] DOB: 1971-01-12  Courtney Clayton is a 53 y.o. year old female who is a primary care patient of Nooruddin, Saad, MD and is actively engaged with the care management team. I reached out to Courtney LITTIE Alert by phone today to assist with re-scheduling  with the Pharmacist.  Follow up plan: Unsuccessful telephone outreach attempt made. A HIPAA compliant phone message was left for the patient providing contact information and requesting a return call.  Leotis Rase [facility], [facility] Guide  Direct Dial: [phone]  Fax [phone]

## 2024-04-13 NOTE — Progress Notes (Signed)
 Complex Care Management Care Guide Note  04/13/2024 Name: Courtney Clayton MRN: [id number] DOB: 10/05/1970  Courtney Clayton Rallo is a 53 y.o. year old female who is a primary care patient of Nooruddin, Saad, MD and is actively engaged with the care management team. I reached out to Courtney Clayton by phone today to assist with re-scheduling  with the Pharmacist.  Follow up plan: Unsuccessful telephone outreach attempt made. A HIPAA compliant phone message was left for the patient providing contact information and requesting a return call.  Courtney Clayton [facility], [facility] Guide  Direct Dial: [phone]  Fax [phone]

## 2024-04-24 ENCOUNTER — Other Ambulatory Visit: Payer: Self-pay

## 2024-05-16 ENCOUNTER — Emergency Department (HOSPITAL_COMMUNITY)
Admission: EM | Admit: 2024-05-16 | Discharge: 2024-05-17 | Discharge status: Against Medical Advice | Attending: Emergency Medicine | Admitting: Emergency Medicine

## 2024-05-16 ENCOUNTER — Emergency Department (HOSPITAL_COMMUNITY)

## 2024-05-16 ENCOUNTER — Encounter: Payer: Self-pay | Admitting: Student

## 2024-05-16 ENCOUNTER — Other Ambulatory Visit: Payer: Self-pay

## 2024-05-16 ENCOUNTER — Ambulatory Visit: Admitting: Student

## 2024-05-16 VITALS — BP 146/94 | HR 85 | Temp 98.7°F | Ht 65.0 in | Wt 299.6 lb

## 2024-05-16 DIAGNOSIS — R059 Cough, unspecified: Secondary | ICD-10-CM | POA: Insufficient documentation

## 2024-05-16 DIAGNOSIS — Z7984 Long term (current) use of oral hypoglycemic drugs: Secondary | ICD-10-CM

## 2024-05-16 DIAGNOSIS — J4521 Mild intermittent asthma with (acute) exacerbation: Secondary | ICD-10-CM | POA: Diagnosis not present

## 2024-05-16 DIAGNOSIS — R0602 Shortness of breath: Secondary | ICD-10-CM | POA: Diagnosis present

## 2024-05-16 DIAGNOSIS — Z7985 Long-term (current) use of injectable non-insulin antidiabetic drugs: Secondary | ICD-10-CM

## 2024-05-16 DIAGNOSIS — E119 Type 2 diabetes mellitus without complications: Secondary | ICD-10-CM | POA: Diagnosis not present

## 2024-05-16 DIAGNOSIS — J101 Influenza due to other identified influenza virus with other respiratory manifestations: Secondary | ICD-10-CM | POA: Diagnosis not present

## 2024-05-16 DIAGNOSIS — Z5321 Procedure and treatment not carried out due to patient leaving prior to being seen by health care provider: Secondary | ICD-10-CM | POA: Diagnosis not present

## 2024-05-16 LAB — POCT GLYCOSYLATED HEMOGLOBIN (HGB A1C): HbA1c, POC (controlled diabetic range): 7.7 % — AB (ref 0.0–7.0)

## 2024-05-16 LAB — GLUCOSE, CAPILLARY: Glucose-Capillary: 161 mg/dL — ABNORMAL HIGH (ref 70–99)

## 2024-05-16 MED ORDER — IPRATROPIUM-ALBUTEROL 0.5-2.5 (3) MG/3ML IN SOLN
3.0000 mL | Freq: Once | RESPIRATORY_TRACT | Status: AC
  Administered 2024-05-16: 3 mL via RESPIRATORY_TRACT
  Filled 2024-05-16: qty 3

## 2024-05-16 NOTE — Progress Notes (Signed)
 "  CC: Acute Concern of cough and shortness of breath  HPI:  Courtney Clayton is a 54 y.o. female with pertinent PMH of hypertension 2/2 hyperaldosteronism, type 2 diabetes, hyperlipidemia, asthma, migraines with aura, obesity, and depression who presents as above. Please see assessment and plan below for further details.  Medications: Current Outpatient Medications  Medication Instructions   acetaminophen  (TYLENOL ) 1,000 mg, Oral, Every 6 hours PRN   amLODipine  (NORVASC ) 10 mg, Oral, Daily   hydrochlorothiazide  (HYDRODIURIL ) 25 mg, Oral, Daily   Jardiance  10 mg, Oral, Daily before breakfast   losartan  (COZAAR ) 100 mg, Oral, Daily   Multiple Vitamins-Minerals (ONE A DAY WOMEN 50 PLUS) TABS 1 tablet, Every morning   propranolol  ER (INDERAL  LA) 80 mg, Oral, Daily   Semaglutide (0.25 or 0.5MG /DOS) 0.25 mg, Subcutaneous, Weekly, Take for four weeks. If tolerating then increase to 0.5 mg weekly   sertraline  (ZOLOFT ) 25 mg, Oral, Daily   simvastatin  (ZOCOR ) 40 mg, Oral, Daily-1800   spironolactone  (ALDACTONE ) 25 mg, Oral, Daily   spironolactone  (ALDACTONE ) 25 mg, Oral, Daily     Review of Systems:   Pertinent items noted in HPI and/or A&P.  Physical Exam:  Vitals:   05/16/24 1505 05/16/24 1530  BP: (!) 147/93 (!) 146/94  Pulse: 83 85  Temp: 98.7 F (37.1 C)   TempSrc: Oral   SpO2: 96%   Weight: 299 lb 9.6 oz (135.9 kg)   Height: 5' 5 (1.651 m)     Constitutional: Ill-appearing adult female.  Mild respiratory distress HEENT: Normocephalic, atraumatic, Sclera non-icteric, PERRL, EOM intact Cardio:Regular rate and rhythm. 2+ bilateral radial pulses. Pulm: Wheezing throughout lung fields with air movement throughout lung fields.  Tachypneic and some increased work of breathing on room air FDX:Wzhjupcz for extremity edema. Skin:Warm and dry. Neuro:Alert and oriented x3. No focal deficit noted. Psych:Pleasant mood and affect.   Assessment & Plan:   Assessment & Plan Mild  intermittent asthma with acute exacerbation Influenza A Patient presents with persistent cough, shortness of breath, body aches, congestion, and fevers that have been going on since 05/03/2024.  She was seen in urgent care for the same symptoms on 05/06/2024 and was positive for flu A.  She was treated for a mild asthma exacerbation with 5 days of prednisone , albuterol  nebulizers, and symptomatic treatment with cough suppressants.  She reported some mild improvement in symptoms while on the prednisone  but since finishing the prednisone  about 5 days ago her symptoms of gotten worse.  Vital signs show no tachycardia, no fever, and good oxygen saturation on room air both at rest and with ambulation.  On exam she is significantly dyspneic and tachypneic, speaking in short sentences, has diffuse wheezing but air movement throughout all lung fields.  We discussed the risks, benefits, and alternatives of retrialing outpatient treatment versus ED evaluation.  She has been using albuterol  nebulizers every 4 hours with mild improvement in her symptoms that is short-lived.  Since her symptoms have been getting worse we both agree that ED evaluation is the best course of action and she will present to the ED right now.  Her daughter can drive her. Controlled type 2 diabetes mellitus without complication, without long-term current use of insulin  (HCC) A1c today 7.7 down from 9.5.  Unable to further discuss this and we will address at the next visit.  Orders Placed This Encounter  Procedures   Glucose, capillary   POC Hbg A1C     Patient discussed with Dr. MICAEL Riis Winfrey  Fairy Pool, DO Internal Medicine Center Internal Medicine Resident PGY-3 Clinic Phone: [phone] Please contact the on call pager at [phone] for any urgent or emergent needs. "

## 2024-05-16 NOTE — ED Triage Notes (Signed)
 Patient here with shob that started over a wee ago. She has a cough that also started a week ago. She tested positive for flu A on 12/26 and PCP sent her to be evaluated here for the continued shob.

## 2024-05-16 NOTE — ED Triage Notes (Signed)
Productive cough 

## 2024-05-16 NOTE — Assessment & Plan Note (Addendum)
 Patient presents with persistent cough, shortness of breath, body aches, congestion, and fevers that have been going on since 05/03/2024.  She was seen in urgent care for the same symptoms on 05/06/2024 and was positive for flu A.  She was treated for a mild asthma exacerbation with 5 days of prednisone , albuterol  nebulizers, and symptomatic treatment with cough suppressants.  She reported some mild improvement in symptoms while on the prednisone  but since finishing the prednisone  about 5 days ago her symptoms of gotten worse.  Vital signs show no tachycardia, no fever, and good oxygen saturation on room air both at rest and with ambulation.  On exam she is significantly dyspneic and tachypneic, speaking in short sentences, has diffuse wheezing but air movement throughout all lung fields.  We discussed the risks, benefits, and alternatives of retrialing outpatient treatment versus ED evaluation.  She has been using albuterol  nebulizers every 4 hours with mild improvement in her symptoms that is short-lived.  Since her symptoms have been getting worse we both agree that ED evaluation is the best course of action and she will present to the ED right now.  Her daughter can drive her.

## 2024-05-16 NOTE — Assessment & Plan Note (Addendum)
 A1c today 7.7 down from 9.5.  Unable to further discuss this and we will address at the next visit.

## 2024-05-16 NOTE — ED Provider Triage Note (Signed)
 Emergency Medicine Provider Triage Evaluation Note  Courtney Clayton , a 54 y.o. female  was evaluated in triage.  Pt complains of cough, shob.  Positive for influenza A on 12/26, sent by PCP.  Patient was given 5-day steroid treatment  Review of Systems  Positive: Cough, shortness of breath Negative: Fever, chills, nausea, vomiting  Physical Exam  BP (!) 149/92 (BP Location: Right Arm)   Pulse 76   Temp 97.6 F (36.4 C)   Resp 18   Wt 135.6 kg   SpO2 94%   BMI 49.76 kg/m  Gen:   Awake, no distress   Resp:  Actively coughing, dyspneic, increased effort MSK:   Moves extremities without difficulty  Other:    Medical Decision Making  Medically screening exam initiated at 4:27 PM.  Appropriate orders placed.  Courtney Clayton Alert was informed that the remainder of the evaluation will be completed by another provider, this initial triage assessment does not replace that evaluation, and the importance of remaining in the ED until their evaluation is complete.  Imaging ordered   Courtney Ileana SAILOR, PA-C 05/16/24 [id number]

## 2024-05-17 ENCOUNTER — Ambulatory Visit: Payer: Self-pay

## 2024-05-17 NOTE — Telephone Encounter (Signed)
 FYI Only or Action Required?: Action required by provider: clinical question for provider and update on patient condition.  Patient was last seen in primary care on 05/16/2024 by Jolaine Pac, DO.  Called Nurse Triage reporting Cough and Shortness of Breath.  Symptoms began yesterday.  Interventions attempted: Other: Seen in ED 05/16/24.  Symptoms are: unchanged.  Triage Disposition: Call PCP Now  Patient/caregiver understands and will follow disposition?: No, wishes to speak with PCP    Copied from CRM #[id number]. Topic: Clinical - Red Word Triage >> May 17, 2024 11:08 AM [name] ORN wrote: Red Word that prompted transfer to Nurse Triage: Seen at ED but just triage same symptoms asking if something can be called. Reason for Disposition  [1] Caller requests to speak ONLY to PCP AND [2] URGENT question  Answer Assessment - Initial Assessment Questions 1. REASON FOR CALL or QUESTION: What is your reason for calling today? or How can I best    Patient was seen in office yesterday for cough and SOB. She states they ran tests, but the wait time was long. She stated all the ED staff did was provide a breathing treatment. She states her SOB is moderate during call, but she dos not want to go back to the ED as they will not do anything. She wants provider to review the ED notes, and call in medication based on her presenting symptoms. Please advise.  Protocols used: PCP Call - No Triage-A-AH

## 2024-05-17 NOTE — Telephone Encounter (Signed)
 Pt saw Dr Jolaine yesterday and went to the ER.

## 2024-05-17 NOTE — Telephone Encounter (Signed)
 Called pt this am - no answer; left message on pt's vm to call the office if she needs to speak to someone. Per chart, pt was in the ER yesterday.

## 2024-05-17 NOTE — ED Notes (Signed)
 Pts name was called three times no answer. Pts name was taken off the floor

## 2024-05-18 NOTE — Progress Notes (Signed)
 Internal Medicine Clinic Attending  Case discussed with the resident at the time of the visit.  We reviewed the resident's history and exam and pertinent patient test results.  I agree with the assessment, diagnosis, and plan of care documented in the resident's note.

## 2024-05-22 NOTE — Telephone Encounter (Signed)
 F/U call - pt state she continues to have a cough and some SOB but better. Stated she has been using Albuterol  in her nebulizer, this has helped. Decline an appt; state she will call back if an appt is needed.

## 2024-05-28 ENCOUNTER — Encounter (HOSPITAL_BASED_OUTPATIENT_CLINIC_OR_DEPARTMENT_OTHER): Payer: Self-pay

## 2024-05-29 ENCOUNTER — Institutional Professional Consult (permissible substitution) (HOSPITAL_BASED_OUTPATIENT_CLINIC_OR_DEPARTMENT_OTHER): Admitting: Cardiovascular Disease

## 2024-05-29 NOTE — Progress Notes (Unsigned)
 "  Advanced Hypertension Clinic Initial Assessment:    Date:  05/29/2024   ID:  Courtney Clayton, DOB 07-31-1970, MRN [id number]  PCP:  Nooruddin, Saad, MD  Cardiologist:  None  Nephrologist:  Referring MD: Eben Reyes BROCKS, MD   CC: Hypertension  History of Present Illness:    Courtney Clayton is a 54 y.o. female with a hx of hypertension, morbid obesity, diabetes, and migraines here to establish care in the [facility].  She was admitted 01/2024 with hypertensive emergency.  She has had issues with controlling her blood pressure and struggled with adherence and cost.  She was admitted 01/2024 after coming to clinic with systolic blood pressures in the 200s.  She had headaches and blurred vision.  Aldosterone to renin ratio was greater than 31, though her aldosterone level was only 5.2.  CTA of the abdomen and chest 10/2023 revealed no adrenal hyperplasia or adrenal nodules.  Renal arteries were patent.  She was discharged on amlodipine , hydrochlorothiazide , losartan , propranolol , and spironolactone .  She was seen in the ED 05/2024 with Influenza A.   Discussed the use of AI scribe software for clinical note transcription with the patient, who gave verbal consent to proceed.  History of Present Illness      Previous antihypertensives:  Secondary Causes of Hypertension  Medications/Herbal: OCP, steroids, stimulants, antidepressants, weight loss medication, immune suppressants, NSAIDs, sympathomimetics, alcohol, caffeine , licorice, ginseng, St. John's wort, chemo  Sleep Apnea Renal artery stenosis Hyperaldosteronism Hyper/hypothyroidism Pheochromocytoma: palpitations, tachycardia, headache, diaphoresis (plasma metanephrines) Cushing's syndrome: Cushingoid facies, central obesity, proximal muscle weakness, and ecchymoses, adrenal incidentaloma (cortisol) Coarctation of the aorta  Past Medical History:  Diagnosis Date   Asthma    CHF (congestive heart failure)  (HCC)    Depression    Diabetes mellitus without complication (HCC)    Hypertension    Hypertensive urgency 10/03/2021   Hypertensive urgency 10/03/2021   MI, old    Vaginal itching 12/21/2022    Past Surgical History:  Procedure Laterality Date   ABDOMINAL HYSTERECTOMY     BACK SURGERY     GANGLION CYST EXCISION     HERNIA REPAIR     TONSILLECTOMY      Current Medications: Active Medications[1]   Allergies:   Pineapple and Sulfa  antibiotics   Social History   Socioeconomic History   Marital status: Divorced    Spouse name: Not on file   Number of children: Not on file   Years of education: Not on file   Highest education level: Some college, no degree  Occupational History   Not on file  Tobacco Use   Smoking status: Never   Smokeless tobacco: Never  Vaping Use   Vaping status: Never Used  Substance and Sexual Activity   Alcohol use: Not Currently    Comment: rarely   Drug use: Never   Sexual activity: Not on file  Other Topics Concern   Not on file  Social History Narrative   Not on file   Social Drivers of Health   Tobacco Use: Low Risk (05/16/2024)   Patient History    Smoking Tobacco Use: Never    Smokeless Tobacco Use: Never    Passive Exposure: Not on file  Financial Resource Strain: Low Risk (05/16/2024)   Overall Financial Resource Strain (CARDIA)    Difficulty of Paying Living Expenses: Not very hard  Food Insecurity: No Food Insecurity (05/16/2024)   Epic    Worried About Running Out of Food in the Last  Year: Never true    Ran Out of Food in the Last Year: Never true  Transportation Needs: Unmet Transportation Needs (05/16/2024)   Epic    Lack of Transportation (Medical): No    Lack of Transportation (Non-Medical): Yes  Physical Activity: Inactive (05/16/2024)   Exercise Vital Sign    Days of Exercise per Week: 0 days    Minutes of Exercise per Session: Not on file  Stress: Stress Concern Present (05/16/2024)   [employer] of Occupational  Health - Occupational Stress Questionnaire    Feeling of Stress: Very much  Social Connections: Moderately Integrated (05/16/2024)   Social Connection and Isolation Panel    Frequency of Communication with Friends and Family: More than three times a week    Frequency of Social Gatherings with Friends and Family: Twice a week    Attends Religious Services: More than 4 times per year    Active Member of Clubs or Organizations: Yes    Attends Banker Meetings: More than 4 times per year    Marital Status: Divorced  Depression (PHQ2-9): High Risk (05/16/2024)   Depression (PHQ2-9)    PHQ-2 Score: 17  Alcohol Screen: Low Risk (05/16/2024)   Alcohol Screen    Last Alcohol Screening Score (AUDIT): 1  Housing: High Risk (05/16/2024)   Epic    Unable to Pay for Housing in the Last Year: Yes    Number of Times Moved in the Last Year: 0    Homeless in the Last Year: No  Utilities: Not At Risk (01/27/2024)   Epic    Threatened with loss of utilities: No  Health Literacy: Not on file     Family History: The patient's ***family history is not on file.  ROS:   Please see the history of present illness.    *** All other systems reviewed and are negative.  EKGs/Labs/Other Studies Reviewed:    EKG:  EKG is *** ordered today.  The ekg ordered today demonstrates ***  Recent Labs: 01/21/2024: ALT 18 01/26/2024: Hemoglobin 14.6; Platelets 291 01/27/2024: B Natriuretic Peptide 3.2 02/09/2024: BUN 14; Creatinine, Ser 0.90; Potassium 4.1; Sodium 135   Recent Lipid Panel    Component Value Date/Time   CHOL 188 04/29/2023 0951   TRIG 61 04/29/2023 0951   HDL 59 04/29/2023 0951   CHOLHDL 3.2 04/29/2023 0951   CHOLHDL 4.2 06/24/2022 0440   VLDL 14 06/24/2022 0440   LDLCALC 118 (H) 04/29/2023 0951    Physical Exam:   VS:  There were no vitals taken for this visit. , BMI There is no height or weight on file to calculate BMI. GENERAL:  Well appearing HEENT: Pupils equal round and reactive,  fundi not visualized, oral mucosa unremarkable NECK:  No jugular venous distention, waveform within normal limits, carotid upstroke brisk and symmetric, no bruits, no thyromegaly LYMPHATICS:  No cervical adenopathy LUNGS:  Clear to auscultation bilaterally HEART:  RRR.  PMI not displaced or sustained,S1 and S2 within normal limits, no S3, no S4, no clicks, no rubs, *** murmurs ABD:  Flat, positive bowel sounds normal in frequency in pitch, no bruits, no rebound, no guarding, no midline pulsatile mass, no hepatomegaly, no splenomegaly EXT:  2 plus pulses throughout, no edema, no cyanosis no clubbing SKIN:  No rashes no nodules NEURO:  Cranial nerves II through XII grossly intact, motor grossly intact throughout PSYCH:  Cognitively intact, oriented to person place and time   ASSESSMENT/PLAN:    Assessment and Plan Assessment &  Plan       Screening for Secondary Hypertension: { Click here to document screening for secondary causes of HTN  :[id number]}    Relevant Labs/Studies:    Latest Ref Rng & Units 02/09/2024   12:00 AM 01/27/2024    2:29 AM 01/26/2024    5:34 PM  Basic Labs  Sodium 134 - 144 mmol/L 135  138  136   Potassium 3.5 - 5.2 mmol/L 4.1  3.5  3.8   Creatinine 0.57 - 1.00 mg/dL 9.09  9.21  9.19        Latest Ref Rng & Units 06/24/2022    5:41 AM  Thyroid   TSH 0.350 - 4.500 uIU/mL 0.399        Latest Ref Rng & Units 07/19/2023    9:27 AM  Renin/Aldosterone   Aldosterone 0.0 - 30.0 ng/dL 5.2   Aldos/Renin Ratio 0.0 - 30.0 >31.1                  Disposition:    FU with MD/PharmD in {gen number [id number]} {Days to years:10300}    Medication Adjustments/Labs and Tests Ordered: Current medicines are reviewed at length with the patient today.  Concerns regarding medicines are outlined above.  No orders of the defined types were placed in this encounter.  No orders of the defined types were placed in this encounter.    Signed, Annabella Scarce, MD   05/29/2024 7:51 AM    [facility]     [1]  No outpatient medications have been marked as taking for the 05/29/24 encounter (Appointment) with Scarce Annabella, MD.   "

## 2024-06-06 ENCOUNTER — Telehealth: Payer: Self-pay | Admitting: *Deleted

## 2024-06-06 NOTE — Telephone Encounter (Signed)
 Will forward tom PCP.                      Copied from CRM #[id number]. Topic: Clinical - Medication Refill >> Jun 06, 2024  1:48 PM [name] ORN wrote: Medication: Patient states she needs refills on all medications that do not have any further refills left  She does know that one she is out of and needs is Ozempic  unsure of others   Has the patient contacted their pharmacy? No (Agent: If no, request that the patient contact the pharmacy for the refill. If patient does not wish to contact the pharmacy document the reason why and proceed with request.) (Agent: If yes, when and what did the pharmacy advise?) no refills on 3, patient not sure which medications   This is the patient's preferred pharmacy:  [facility] [address] [place] - [address] [place] [address] Phone: [phone] Fax: [phone]   Is this the correct pharmacy for this prescription? Yes If no, delete pharmacy and type the correct one.   Has the prescription been filled recently? No - October  Is the patient out of the medication? No  Has the patient been seen for an appointment in the last year OR does the patient have an upcoming appointment? Yes  Can we respond through MyChart? Yes  Agent: Please be advised that Rx refills may take up to 3 business days. We ask that you follow-up with your pharmacy.

## 2024-06-08 ENCOUNTER — Other Ambulatory Visit: Payer: Self-pay

## 2024-06-08 ENCOUNTER — Other Ambulatory Visit: Payer: Self-pay | Admitting: Student

## 2024-06-08 ENCOUNTER — Encounter: Payer: Self-pay | Admitting: *Deleted

## 2024-06-08 DIAGNOSIS — I10 Essential (primary) hypertension: Secondary | ICD-10-CM

## 2024-06-08 MED ORDER — SEMAGLUTIDE(0.25 OR 0.5MG/DOS) 2 MG/3ML ~~LOC~~ SOPN
0.2500 mg | PEN_INJECTOR | SUBCUTANEOUS | 2 refills | Status: AC
  Filled 2024-06-08: qty 3, 42d supply, fill #0

## 2024-06-08 MED ORDER — SERTRALINE HCL 25 MG PO TABS
25.0000 mg | ORAL_TABLET | Freq: Every day | ORAL | 2 refills | Status: AC
  Filled 2024-06-08: qty 30, 30d supply, fill #0

## 2024-06-08 MED ORDER — HYDROCHLOROTHIAZIDE 25 MG PO TABS
25.0000 mg | ORAL_TABLET | Freq: Every day | ORAL | 2 refills | Status: AC
  Filled 2024-06-08: qty 30, 30d supply, fill #0

## 2024-06-08 NOTE — Telephone Encounter (Signed)
 Copied from CRM [id number]. Topic: Clinical - Medication Refill >> Jun 08, 2024  8:32 AM Miquel SAILOR wrote: Medication: simvastatin  (ZOCOR ) 40 MG tablet   Has the patient contacted their pharmacy? Yes (Agent: If no, request that the patient contact the pharmacy for the refill. If patient does not wish to contact the pharmacy document the reason why and proceed with request.) (Agent: If yes, when and what did the pharmacy advise?)  This is the patient's preferred pharmacy:    [facility] [address], [address] [place] [address] Phone: [phone] Fax: [phone]  Is this the correct pharmacy for this prescription? Yes If no, delete pharmacy and type the correct one.   Has the prescription been filled recently? Yes  Is the patient out of the medication? Yes  Has the patient been seen for an appointment in the last year OR does the patient have an upcoming appointment? Yes  Can we respond through MyChart? Yes  Agent: Please be advised that Rx refills may take up to 3 business days. We ask that you follow-up with your pharmacy.

## 2024-08-22 ENCOUNTER — Institutional Professional Consult (permissible substitution) (HOSPITAL_BASED_OUTPATIENT_CLINIC_OR_DEPARTMENT_OTHER): Admitting: Cardiovascular Disease
